# Patient Record
Sex: Male | Born: 1985 | Race: Black or African American | Hispanic: No | Marital: Single
Health system: Midwestern US, Community
[De-identification: ages and names within clinical notes are randomized; demographics above are authoritative.]

## PROBLEM LIST (undated history)

## (undated) ENCOUNTER — Ambulatory Visit (HOSPITAL_COMMUNITY): Payer: Self-pay

## (undated) DIAGNOSIS — K219 Gastro-esophageal reflux disease without esophagitis: Secondary | ICD-10-CM

## (undated) DIAGNOSIS — R1115 Cyclical vomiting syndrome unrelated to migraine: Secondary | ICD-10-CM

## (undated) DIAGNOSIS — F319 Bipolar disorder, unspecified: Secondary | ICD-10-CM

## (undated) DIAGNOSIS — F23 Brief psychotic disorder: Secondary | ICD-10-CM

## (undated) DIAGNOSIS — N200 Calculus of kidney: Secondary | ICD-10-CM

## (undated) HISTORY — DX: Gastro-esophageal reflux disease without esophagitis: K21.9

## (undated) HISTORY — PX: APPENDECTOMY: SHX54

## (undated) HISTORY — PX: OTHER SURGICAL HISTORY: SHX169

---
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
## 2007-09-08 ENCOUNTER — Emergency Department (HOSPITAL_COMMUNITY): Admission: EM | Admit: 2007-09-08 | Discharge: 2007-09-08 | Payer: Self-pay | Admitting: Emergency Medicine

## 2008-01-19 ENCOUNTER — Emergency Department (HOSPITAL_COMMUNITY): Admission: EM | Admit: 2008-01-19 | Discharge: 2008-01-19 | Payer: Self-pay | Admitting: Emergency Medicine

## 2008-01-19 ENCOUNTER — Emergency Department (HOSPITAL_COMMUNITY): Admission: EM | Admit: 2008-01-19 | Discharge: 2008-01-20 | Payer: Self-pay | Admitting: Emergency Medicine

## 2008-08-18 ENCOUNTER — Emergency Department (HOSPITAL_COMMUNITY): Admission: EM | Admit: 2008-08-18 | Discharge: 2008-08-18 | Payer: Self-pay | Admitting: Emergency Medicine

## 2008-08-20 ENCOUNTER — Emergency Department (HOSPITAL_COMMUNITY): Admission: EM | Admit: 2008-08-20 | Discharge: 2008-08-20 | Payer: Self-pay | Admitting: Emergency Medicine

## 2009-01-18 ENCOUNTER — Emergency Department (HOSPITAL_COMMUNITY): Admission: EM | Admit: 2009-01-18 | Discharge: 2009-01-18 | Payer: Self-pay | Admitting: Emergency Medicine

## 2009-03-09 ENCOUNTER — Emergency Department (HOSPITAL_COMMUNITY): Admission: EM | Admit: 2009-03-09 | Discharge: 2009-03-09 | Payer: Self-pay | Admitting: Emergency Medicine

## 2009-09-10 ENCOUNTER — Emergency Department (HOSPITAL_COMMUNITY): Admission: EM | Admit: 2009-09-10 | Discharge: 2009-09-10 | Payer: Self-pay | Admitting: Emergency Medicine

## 2010-04-24 ENCOUNTER — Emergency Department (HOSPITAL_COMMUNITY): Admission: EM | Admit: 2010-04-24 | Discharge: 2010-04-24 | Payer: Self-pay | Admitting: Emergency Medicine

## 2010-04-25 ENCOUNTER — Emergency Department (HOSPITAL_COMMUNITY): Admission: EM | Admit: 2010-04-25 | Discharge: 2010-04-25 | Payer: Self-pay | Admitting: Emergency Medicine

## 2010-04-25 ENCOUNTER — Encounter (INDEPENDENT_AMBULATORY_CARE_PROVIDER_SITE_OTHER): Payer: Self-pay | Admitting: *Deleted

## 2010-04-28 ENCOUNTER — Encounter (INDEPENDENT_AMBULATORY_CARE_PROVIDER_SITE_OTHER): Payer: Self-pay | Admitting: *Deleted

## 2010-06-10 ENCOUNTER — Encounter: Payer: Self-pay | Admitting: Gastroenterology

## 2010-06-10 ENCOUNTER — Ambulatory Visit
Admission: RE | Admit: 2010-06-10 | Discharge: 2010-06-10 | Payer: Self-pay | Source: Home / Self Care | Attending: Gastroenterology | Admitting: Gastroenterology

## 2010-06-10 ENCOUNTER — Encounter (INDEPENDENT_AMBULATORY_CARE_PROVIDER_SITE_OTHER): Payer: Self-pay | Admitting: *Deleted

## 2010-06-10 DIAGNOSIS — R1013 Epigastric pain: Secondary | ICD-10-CM | POA: Insufficient documentation

## 2010-06-18 ENCOUNTER — Ambulatory Visit
Admission: RE | Admit: 2010-06-18 | Discharge: 2010-06-18 | Payer: Self-pay | Source: Home / Self Care | Attending: Gastroenterology | Admitting: Gastroenterology

## 2010-06-18 ENCOUNTER — Encounter: Payer: Self-pay | Admitting: Gastroenterology

## 2010-06-29 ENCOUNTER — Ambulatory Visit (HOSPITAL_COMMUNITY)
Admission: RE | Admit: 2010-06-29 | Discharge: 2010-06-29 | Payer: Self-pay | Source: Home / Self Care | Attending: Gastroenterology | Admitting: Gastroenterology

## 2010-06-30 ENCOUNTER — Encounter: Payer: Self-pay | Admitting: Gastroenterology

## 2010-06-30 NOTE — Letter (Signed)
Summary: New Patient letter  [facility]  [address] [place], [place] [address]   Phone: [phone]  Fax: [phone]       04/28/2010 MRN: [id number]  Erik Cowan [address] [place], [place]  [address]  Dear Mr. Lyn Hollingshead,  Welcome to the Gastroenterology Division at [facility].    You are scheduled to see Dr.  Arlyce Dice on 06/10/2010 at 10:45AM on the 3rd floor at [facility], [address]  We ask that you try to arrive at our office 15 minutes prior to your appointment time to allow for check-in.  We would like you to complete the enclosed self-administered evaluation form prior to your visit and bring it with you on the day of your appointment.  We will review it with you.  Also, please bring a complete list of all your medications or, if you prefer, bring the medication bottles and we will list them.  Please bring your insurance card so that we may make a copy of it.  If your insurance requires a referral to see a specialist, please bring your referral form from your primary care physician.  Co-payments are due at the time of your visit and may be paid by cash, check or credit card.     Your office visit will consist of a consult with your physician (includes a physical exam), any laboratory testing he/she may order, scheduling of any necessary diagnostic testing (e.g. x-ray, ultrasound, CT-scan), and scheduling of a procedure (e.g. Endoscopy, Colonoscopy) if required.  Please allow enough time on your schedule to allow for any/all of these possibilities.    If you cannot keep your appointment, please call [phone] to cancel or reschedule prior to your appointment date.  This allows [place] the opportunity to schedule an appointment for another patient in need of care.  If you do not cancel or reschedule by 5 p.m. the business day prior to your appointment date, you will be charged a $50.00 late cancellation/no-show fee.    Thank you for  choosing [facility] for your medical needs.  We appreciate the opportunity to care for you.  Please visit [place] at our website  to learn more about our practice.                     Sincerely,                                                             The Gastroenterology Division

## 2010-07-02 NOTE — Letter (Signed)
Summary: EGD Instructions  [facility]  [address] [place], [place] [address]   Phone: [phone]  Fax: [phone]       Erik Cowan    01-14-1986    MRN: [id number]       Procedure Day /Date: Thursday 06/18/2010     Arrival Time: 8:30am     Procedure Time: 9:30am     Location of Procedure:                    [facility] (4th Floor)  PREPARATION FOR ENDOSCOPY  On 06/18/2010 THE DAY OF THE PROCEDURE:  1.   No solid foods, milk or milk products are allowed after midnight the night before your procedure.  2.   Do not drink anything colored red or purple.  Avoid juices with pulp.  No orange juice.  3.  You may drink clear liquids until 7:30am, which is 2 hours before your procedure.                                                                                                CLEAR LIQUIDS INCLUDE: Water Jello Ice Popsicles Tea (sugar ok, no milk/cream) Powdered fruit flavored drinks Coffee (sugar ok, no milk/cream) Gatorade Juice: apple, white grape, white cranberry  Lemonade Clear bullion, consomm, broth Carbonated beverages (any kind) Strained chicken noodle soup Hard Candy   MEDICATION INSTRUCTIONS  Unless otherwise instructed, you should take regular prescription medications with a small sip of water as early as possible the morning of your procedure.                   OTHER INSTRUCTIONS  You will need a responsible adult at least 25 years of age to accompany you and drive you home.   This person must remain in the waiting room during your procedure.  Wear loose fitting clothing that is easily removed.  Leave jewelry and other valuables at home.  However, you may wish to bring a book to read or an iPod/MP3 player to listen to music as you wait for your procedure to start.  Remove all body piercing jewelry and leave at home.  Total time from sign-in until discharge is approximately 2-3 hours.  You should go  home directly after your procedure and rest.  You can resume normal activities the day after your procedure.  The day of your procedure you should not:   Drive   Make legal decisions   Operate machinery   Drink alcohol   Return to work  You will receive specific instructions about eating, activities and medications before you leave.    The above instructions have been reviewed and explained to me by   _______________________    I fully understand and can verbalize these instructions _____________________________ Date _________

## 2010-07-02 NOTE — Letter (Signed)
Summary: Results Letter  [facility]  [address] [place], [place] [address]   Phone: [phone]  Fax: [phone]        June 10, 2010 MRN: [id number]    Erik Cowan [address] [place], [place]  [address]    Dear [name],  It is my pleasure to have treated you recently as a new patient in my office. I appreciate your confidence and the opportunity to participate in your care.  Since I do have a busy inpatient endoscopy schedule and office schedule, my office hours vary weekly. I am, however, available for emergency calls everyday through my office. If I am not available for an urgent office appointment, another one of our gastroenterologist will be able to assist you.  My well-trained staff are prepared to help you at all times. For emergencies after office hours, a physician from our Gastroenterology section is always available through my 24 hour answering service  Once again I welcome you as a new patient and I look forward to a happy and healthy relationship             Sincerely,  Louis Meckel MD  This letter has been electronically signed by your physician.

## 2010-07-02 NOTE — Procedures (Signed)
Summary: Upper Endoscopy  Patient: Erik Cowan Note: All result statuses are Final unless otherwise noted.  Tests: (1) Upper Endoscopy (EGD)   EGD Upper Endoscopy       DONE     [facility]     [address]     [place], [place]  [address]           E3NDOSCOPY PROCEDURE REPORT           PATIENT:  Erik Cowan, Erik Cowan  MR#:  [id number]     BIRTHDATE:  [birth year], 24 yrs. old  GENDER:  male           ENDOSCOPIST:  Barbette Hair. Arlyce Dice, MD     Referred by:           PROCEDURE DATE:  06/18/2010     PROCEDURE:  EGD, diagnostic 43235     ASA CLASS:  Class I     INDICATIONS:  nausea and vomiting, dyspepsia           MEDICATIONS:   Fentanyl 75 mcg IV, Versed 10 mg IV, glycopyrrolate     (Robinal) 0.2 mg IV, 0.6cc simethancone 0.6 cc PO     TOPICAL ANESTHETIC:  Exactacain Spray           DESCRIPTION OF PROCEDURE:   After the risks benefits and     alternatives of the procedure were thoroughly explained, informed     consent was obtained.  The LB GIF-H180 [id number] endoscope was     introduced through the mouth and advanced to the third portion of     the duodenum, without limitations.  The instrument was slowly     withdrawn as the mucosa was fully examined.     <<PROCEDUREIMAGES>>           The upper, middle, and distal third of the esophagus were     carefully inspected and no abnormalities were noted. The z-line     was well seen at the GEJ. The endoscope was pushed into the fundus     which was normal including a retroflexed view. The antrum,gastric     body, first and second part of the duodenum were unremarkable (see     image1, image2, image3, image4, image5, image6, and image7).     Retroflexed views revealed no abnormalities.    The scope was then     withdrawn from the patient and the procedure completed.     COMPLICATIONS:  None           ENDOSCOPIC IMPRESSION:     1) Normal EGD     RECOMMENDATIONS:     1) My office will arrange for you to have a Gastric  Emptying     Scan performed. This is a radiology test that gives an idea of how     well your stomach functions.           REPEAT EXAM:  No           ______________________________     Barbette Hair. Arlyce Dice, MD           CC:           n.     eSIGNED:   Barbette Hair. Kaplan at 06/18/2010 09:57 AM           Janan Halter, [id number]  Note: An exclamation mark (!) indicates a result that was not dispersed into the flowsheet. Document Creation Date: 06/18/2010 9:57 AM _______________________________________________________________________  (1) Order result  status: Final Collection or observation date-time: 06/18/2010 09:50 Requested date-time:  Receipt date-time:  Reported date-time:  Referring Physician:   Ordering Physician: Melvia Heaps [phone]) Specimen Source:  Source: Launa Grill Order Number: [id number] Lab site:   Appended Document: GES Patient scheduled for GES at [facility] for 06/29/10@8am . Patient to arrive at 7:45am. Patient instructed to be NPO after midnight. Patient verbalized understanding.   Clinical Lists Changes  Orders: Added new Test order of Gastric Emptying Scan (GES) - Signed

## 2010-07-02 NOTE — Assessment & Plan Note (Signed)
Summary: REFLUX...AS.   History of Present Illness Visit Type: Initial Visit Primary GI MD: Melvia Heaps MD [facility] Primary Provider: n/a Requesting Provider: Nelva Nay, MD Chief Complaint: severe GERD, mostly after meals History of Present Illness:   Erik Cowan is a 25 year old African American male referred by the ER for evaluation of abdominal pain.  For the last 8 years he has been complaining of chronic upper abdominal symptoms including postprandial bloating, discomfort, nausea and vomiting.  He may have episodes of protracted vomiting soon after eating and may last for up to a day. Episodes  occur at least a week out of every month.  He has been taking Prilosec twice a day without relief.  With nausea vomiting and pain he often develops postprandial diarrhea. There is no history of melena or hematochezia.  He is in no gastric irritants including nonsteroidals.  At an ER evaluation in November, 2011 white count was 9.3 and hemoglobin 13.5.  LFTs were normal.  CT Scan of the abdomen and pelvis was unremarkable.  Weight has been stable.   GI Review of Systems    Reports abdominal pain, acid reflux, belching, bloating, loss of appetite, nausea, vomiting, and  vomiting blood.     Location of  Abdominal pain: epigastric area.    Denies chest pain, dysphagia with liquids, dysphagia with solids, heartburn, weight loss, and  weight gain.      Reports diarrhea.     Denies anal fissure, black tarry stools, change in bowel habit, constipation, diverticulosis, fecal incontinence, heme positive stool, hemorrhoids, irritable bowel syndrome, jaundice, light color stool, liver problems, rectal bleeding, and  rectal pain. Preventive Screening-Counseling & Management  Alcohol-Tobacco     Smoking Status: current      Drug Use:  yes.      Current Medications (verified): 1)  Prilosec Otc 20 Mg Tbec (Omeprazole Magnesium) .... Take 1 Capsule By Mouth Two Times A Day  Allergies (verified): No Known  Drug Allergies  Past History:  Past Medical History: GERD  Family History: Family History of Colon Cancer:Uncle Family History of Liver Cancer:Mother Family History of Diabetes:  Family History of Heart Disease: Paternal  Social History: Occupation: [employer] Patient currently smokes.  Alcohol Use - no Daily Caffeine Use Illicit Drug Use - yes Marijuana Smoking Status:  current Drug Use:  yes  Review of Systems       The patient complains of back pain, fatigue, headaches-new, muscle pains/cramps, and urination changes/pain.  The patient denies allergy/sinus, anemia, anxiety-new, arthritis/joint pain, blood in urine, breast changes/lumps, change in vision, confusion, cough, coughing up blood, depression-new, fainting, fever, hearing problems, heart murmur, heart rhythm changes, itching, menstrual pain, night sweats, nosebleeds, pregnancy symptoms, shortness of breath, skin rash, sleeping problems, sore throat, swelling of feet/legs, swollen lymph glands, thirst - excessive , urination - excessive , urine leakage, vision changes, and voice change.         All other systems were reviewed and were negative   Vital Signs:  Patient profile:   25 year old male Height:      74 inches Weight:      155.38 pounds BMI:     20.02 Pulse rate:   60 / minute Pulse rhythm:   regular BP sitting:   120 / 90  (left arm) Cuff size:   regular  Vitals Entered By: June McMurray CMA Duncan Dull) (June 10, 2010 11:07 AM)  Physical Exam  Additional Exam:  On physical exam he is a thin male  skin: anicteric HEENT: normocephalic; PEERLA; no nasal or pharyngeal abnormalities neck: supple nodes: no cervical lymphadenopathy chest: clear to ausculatation and percussion heart: no murmurs, gallops, or rubs abd: soft, nontender; BS normoactive; no abdominal masses,  organomegaly; there is a mild tenderness to palpation in the epigastrium rectal: deferred ext: no cynanosis, clubbing,  edema skeletal: no deformities neuro: oriented x 3; no focal abnormalities    Impression & Recommendations:  Problem # 1:  ABDOMINAL PAIN, EPIGASTRIC (ICD-789.06)  Patient's symptoms are more dyspeptic than they are suggestive of GERD.  Ulcer or nonulcer dyspepsia are possibilities.  Omeprazole may be contributing to his symptoms.  Gastroparesis and irritable bowel are other concerns.  Recommendations #1 discontinue omeprazole #2 upper endoscopy #32 consider gastric emptying scan pending results of endoscopy #4 hyomax p.r.n.  Orders: EGD (EGD)  Patient Instructions: 1)  Copy sent to : Nelva Nay, MD 2)  Your procedure has been scheduled for 06/12/2010, please follow the seperate instructions.  3)  [facility] Patient Information Guide given to patient.  4)  Upper Endoscopy brochure given.  5)  The medication list was reviewed and reconciled.  All changed / newly prescribed medications were explained.  A complete medication list was provided to the patient / caregiver. Prescriptions: HYOMAX-SL 0.125 MG SUBL (HYOSCYAMINE SULFATE) take 2 tablets sublingual q.4 h. p.r.n. abdominal pain  #25 x 2   Entered and Authorized by:   Louis Meckel MD   Signed by:   Louis Meckel MD on 06/10/2010   Method used:   Electronically to        [facility]* (retail)       [address] [place], [place]  [address]       Ph: [phone]       Fax: [phone]   RxID:   [id number]

## 2010-07-08 NOTE — Letter (Signed)
Summary: Results Letter  [facility]  [address] [place], [place] [address]   Phone: [phone]  Fax: [phone]        June 30, 2010 MRN: [id number]    Erik Cowan [address] [place], [place]  [address]    Dear Mr. Arizpe,  Your gastric emptying scan did not show any remarkable findings.  Please continue with the recommendations previously discussed.  Please make a followup office appointment. Should you have any further questions or immediate concers, feel free to contact me.  Sincerely,  Barbette Hair. Arlyce Dice, M.D., [facility]          Sincerely,  Louis Meckel MD  This letter has been electronically signed by your physician.  Appended Document: Results Letter Letter is mailed to the patient's home address

## 2010-07-10 ENCOUNTER — Telehealth: Payer: Self-pay | Admitting: Gastroenterology

## 2010-07-15 ENCOUNTER — Emergency Department (HOSPITAL_COMMUNITY)
Admission: EM | Admit: 2010-07-15 | Discharge: 2010-07-16 | Disposition: A | Discharge status: Home / Self Care | Payer: Self-pay | Attending: Emergency Medicine | Admitting: Emergency Medicine

## 2010-07-15 ENCOUNTER — Telehealth: Payer: Self-pay | Admitting: Gastroenterology

## 2010-07-15 DIAGNOSIS — R112 Nausea with vomiting, unspecified: Secondary | ICD-10-CM | POA: Insufficient documentation

## 2010-07-15 DIAGNOSIS — K219 Gastro-esophageal reflux disease without esophagitis: Secondary | ICD-10-CM | POA: Insufficient documentation

## 2010-07-15 DIAGNOSIS — R1013 Epigastric pain: Secondary | ICD-10-CM | POA: Insufficient documentation

## 2010-07-15 LAB — CBC
HCT: 35.5 % — ABNORMAL LOW (ref 39.0–52.0)
MCH: 33.7 pg (ref 26.0–34.0)
MCHC: 35.2 g/dL (ref 30.0–36.0)
MCV: 95.7 fL (ref 78.0–100.0)
Platelets: 147 10*3/uL — ABNORMAL LOW (ref 150–400)
RDW: 12 % (ref 11.5–15.5)
WBC: 10.9 10*3/uL — ABNORMAL HIGH (ref 4.0–10.5)

## 2010-07-15 LAB — DIFFERENTIAL
Eosinophils Absolute: 0 10*3/uL (ref 0.0–0.7)
Eosinophils Relative: 0 % (ref 0–5)
Lymphs Abs: 0.8 10*3/uL (ref 0.7–4.0)
Monocytes Absolute: 0.5 10*3/uL (ref 0.1–1.0)
Monocytes Relative: 5 % (ref 3–12)

## 2010-07-16 LAB — LIPASE, BLOOD: Lipase: 33 U/L (ref 11–59)

## 2010-07-16 LAB — HEPATIC FUNCTION PANEL
Bilirubin, Direct: 0.2 mg/dL (ref 0.0–0.3)
Indirect Bilirubin: 0.6 mg/dL (ref 0.3–0.9)
Total Protein: 7.8 g/dL (ref 6.0–8.3)

## 2010-07-16 LAB — BASIC METABOLIC PANEL
BUN: 15 mg/dL (ref 6–23)
Creatinine, Ser: 1.05 mg/dL (ref 0.4–1.5)
GFR calc non Af Amer: >60 mL/min (ref >60)
Glucose, Bld: 148 mg/dL — ABNORMAL HIGH (ref 70–99)

## 2010-07-16 NOTE — Progress Notes (Signed)
Summary: results  Phone Note Call from Patient Call back at Home Phone [phone]   Caller: Patient Call For: Dr Arlyce Dice Reason for Call: Talk to Nurse Summary of Call: Patient wants Emptying Scan results Initial call taken by: Tawni Levy,  July 10, 2010 2:13 PM  Follow-up for Phone Call        Informed patient letter was mailed stating that the GES was normal. Patient scheduled for a f/u appointment with Dr. Arlyce Dice for 08/20/10@10am . Patient verbalized understanding. Follow-up by: Selinda Michaels RN,  July 10, 2010 3:05 PM

## 2010-07-17 ENCOUNTER — Emergency Department (HOSPITAL_COMMUNITY)
Admission: EM | Admit: 2010-07-17 | Discharge: 2010-07-17 | Disposition: A | Discharge status: Home / Self Care | Payer: Self-pay | Attending: Emergency Medicine | Admitting: Emergency Medicine

## 2010-07-17 ENCOUNTER — Emergency Department (HOSPITAL_COMMUNITY): Payer: Self-pay

## 2010-07-17 DIAGNOSIS — R1013 Epigastric pain: Secondary | ICD-10-CM | POA: Insufficient documentation

## 2010-07-17 DIAGNOSIS — R112 Nausea with vomiting, unspecified: Secondary | ICD-10-CM | POA: Insufficient documentation

## 2010-07-17 DIAGNOSIS — K219 Gastro-esophageal reflux disease without esophagitis: Secondary | ICD-10-CM | POA: Insufficient documentation

## 2010-07-17 DIAGNOSIS — R10816 Epigastric abdominal tenderness: Secondary | ICD-10-CM | POA: Insufficient documentation

## 2010-07-17 LAB — POCT I-STAT, CHEM 8
Chloride: 103 mEq/L (ref 96–112)
Glucose, Bld: 110 mg/dL — ABNORMAL HIGH (ref 70–99)
HCT: 41 % (ref 39.0–52.0)
Hemoglobin: 13.9 g/dL (ref 13.0–17.0)
Potassium: 3.8 mEq/L (ref 3.5–5.1)
Sodium: 135 mEq/L (ref 135–145)

## 2010-07-17 LAB — DIFFERENTIAL
Basophils Absolute: 0 10*3/uL (ref 0.0–0.1)
Basophils Relative: 0 % (ref 0–1)
Eosinophils Relative: 0 % (ref 0–5)
Lymphocytes Relative: 7 % — ABNORMAL LOW (ref 12–46)
Monocytes Absolute: 0.8 10*3/uL (ref 0.1–1.0)
Neutro Abs: 8.4 10*3/uL — ABNORMAL HIGH (ref 1.7–7.7)

## 2010-07-17 LAB — CBC
HCT: 38.6 % — ABNORMAL LOW (ref 39.0–52.0)
Hemoglobin: 13.9 g/dL (ref 13.0–17.0)
MCHC: 36 g/dL (ref 30.0–36.0)
RDW: 12 % (ref 11.5–15.5)
WBC: 9.9 10*3/uL (ref 4.0–10.5)

## 2010-07-22 NOTE — Progress Notes (Signed)
  Phone Note Call from Patient   Caller: Brother Summary of Call: On call note @2015 . Pt has recurrent N/V/abd pain without a clear etiology. Hyomax was sent to pharmacy in [place] and pt never received it. Pt requests [facility] in [place] @ [phone]. Hyomax 0.125mg , 1-2 SL q4h as needed for abd pain, #30 and promethazine 25 mg supp, 1 PR q8h as needed for N/V, #6. If his symptoms do not improve should go to ED. Call Dr. Marzetta Board office tomorrow for further mgmt. Initial call taken by: Meryl Dare MD Clementeen Graham,  July 15, 2010 9:20 PM

## 2010-08-11 LAB — DIFFERENTIAL
Basophils Absolute: 0 10*3/uL (ref 0.0–0.1)
Basophils Absolute: 0 10*3/uL (ref 0.0–0.1)
Basophils Absolute: 0 K/uL (ref 0.0–0.1)
Basophils Relative: 0 % (ref 0–1)
Basophils Relative: 0 % (ref 0–1)
Eosinophils Absolute: 0 10*3/uL (ref 0.0–0.7)
Eosinophils Absolute: 0 10*3/uL (ref 0.0–0.7)
Eosinophils Relative: 0 % (ref 0–5)
Lymphocytes Relative: 4 % — ABNORMAL LOW (ref 12–46)
Lymphocytes Relative: 7 % — ABNORMAL LOW (ref 12–46)
Lymphs Abs: 0.6 10*3/uL — ABNORMAL LOW (ref 0.7–4.0)
Lymphs Abs: 0.6 10*3/uL — ABNORMAL LOW (ref 0.7–4.0)
Lymphs Abs: 0.7 10*3/uL (ref 0.7–4.0)
Monocytes Absolute: 0.3 10*3/uL (ref 0.1–1.0)
Monocytes Absolute: 0.6 K/uL (ref 0.1–1.0)
Monocytes Relative: 4 % (ref 3–12)
Monocytes Relative: 4 % (ref 3–12)
Neutro Abs: 14 10*3/uL — ABNORMAL HIGH (ref 1.7–7.7)
Neutro Abs: 8.6 10*3/uL — ABNORMAL HIGH (ref 1.7–7.7)
Neutrophils Relative %: 87 % — ABNORMAL HIGH (ref 43–77)
Neutrophils Relative %: 92 % — ABNORMAL HIGH (ref 43–77)

## 2010-08-11 LAB — COMPREHENSIVE METABOLIC PANEL
ALT: 24 U/L (ref 0–53)
ALT: 27 U/L (ref 0–53)
AST: 32 U/L (ref 0–37)
Alkaline Phosphatase: 62 U/L (ref 39–117)
Alkaline Phosphatase: 71 U/L (ref 39–117)
CO2: 23 mEq/L (ref 19–32)
CO2: 25 mEq/L (ref 19–32)
Calcium: 10.1 mg/dL (ref 8.4–10.5)
Chloride: 102 mEq/L (ref 96–112)
GFR calc Af Amer: >60 mL/min (ref >60)
GFR calc non Af Amer: >60 mL/min (ref >60)
GFR calc non Af Amer: >60 mL/min (ref >60)
Glucose, Bld: 115 mg/dL — ABNORMAL HIGH (ref 70–99)
Glucose, Bld: 153 mg/dL — ABNORMAL HIGH (ref 70–99)
Potassium: 3.6 mEq/L (ref 3.5–5.1)
Sodium: 134 mEq/L — ABNORMAL LOW (ref 135–145)
Sodium: 137 mEq/L (ref 135–145)
Total Bilirubin: 0.9 mg/dL (ref 0.3–1.2)

## 2010-08-11 LAB — CBC
HCT: 34.2 % — ABNORMAL LOW (ref 39.0–52.0)
HCT: 36.9 % — ABNORMAL LOW (ref 39.0–52.0)
HCT: 37.4 % — ABNORMAL LOW (ref 39.0–52.0)
Hemoglobin: 12.2 g/dL — ABNORMAL LOW (ref 13.0–17.0)
Hemoglobin: 13.4 g/dL (ref 13.0–17.0)
Hemoglobin: 13.5 g/dL (ref 13.0–17.0)
MCH: 33.3 pg (ref 26.0–34.0)
MCHC: 36.1 g/dL — ABNORMAL HIGH (ref 30.0–36.0)
MCHC: 36.3 g/dL — ABNORMAL HIGH (ref 30.0–36.0)
MCV: 91.6 fL (ref 78.0–100.0)
Platelets: 130 K/uL — ABNORMAL LOW (ref 150–400)
RBC: 3.73 MIL/uL — ABNORMAL LOW (ref 4.22–5.81)
RBC: 4.03 MIL/uL — ABNORMAL LOW (ref 4.22–5.81)
RDW: 11.3 % — ABNORMAL LOW (ref 11.5–15.5)
RDW: 11.5 % (ref 11.5–15.5)
WBC: 15.2 K/uL — ABNORMAL HIGH (ref 4.0–10.5)
WBC: 9.5 10*3/uL (ref 4.0–10.5)

## 2010-08-11 LAB — URINALYSIS, ROUTINE W REFLEX MICROSCOPIC
Bilirubin Urine: NEGATIVE
Glucose, UA: 100 mg/dL — AB
Ketones, ur: 40 mg/dL — AB
Ketones, ur: 40 mg/dL — AB
Leukocytes, UA: NEGATIVE
Nitrite: NEGATIVE
Nitrite: NEGATIVE
Protein, ur: 30 mg/dL — AB
Protein, ur: NEGATIVE mg/dL
Specific Gravity, Urine: 1.021 (ref 1.005–1.030)
Urobilinogen, UA: 0.2 mg/dL (ref 0.0–1.0)
Urobilinogen, UA: 1 mg/dL (ref 0.0–1.0)
pH: 7.5 (ref 5.0–8.0)

## 2010-08-11 LAB — LIPASE, BLOOD
Lipase: 22 U/L (ref 11–59)
Lipase: 23 U/L (ref 11–59)

## 2010-08-11 LAB — RAPID URINE DRUG SCREEN, HOSP PERFORMED
Amphetamines: NOT DETECTED
Amphetamines: NOT DETECTED
Cocaine: NOT DETECTED
Cocaine: NOT DETECTED
Opiates: NOT DETECTED
Opiates: POSITIVE — AB
Tetrahydrocannabinol: POSITIVE — AB
Tetrahydrocannabinol: POSITIVE — AB

## 2010-08-11 LAB — COMPREHENSIVE METABOLIC PANEL WITH GFR
Albumin: 5 g/dL (ref 3.5–5.2)
BUN: 15 mg/dL (ref 6–23)
Calcium: 10.2 mg/dL (ref 8.4–10.5)
Creatinine, Ser: 1.05 mg/dL (ref 0.4–1.5)
Total Protein: 8 g/dL (ref 6.0–8.3)

## 2010-08-11 LAB — URINE MICROSCOPIC-ADD ON

## 2010-08-11 LAB — POCT I-STAT, CHEM 8
Chloride: 100 mEq/L (ref 96–112)
HCT: 40 % (ref 39.0–52.0)
Potassium: 3.6 mEq/L (ref 3.5–5.1)
Sodium: 136 mEq/L (ref 135–145)

## 2010-08-11 LAB — GC/CHLAMYDIA PROBE AMP, GENITAL
Chlamydia, DNA Probe: NEGATIVE
GC Probe Amp, Genital: NEGATIVE

## 2010-08-11 LAB — ETHANOL: Alcohol, Ethyl (B): <5 mg/dL (ref 0–10)

## 2010-08-19 LAB — URINALYSIS, ROUTINE W REFLEX MICROSCOPIC
Glucose, UA: NEGATIVE mg/dL
Leukocytes, UA: NEGATIVE
Protein, ur: NEGATIVE mg/dL
Specific Gravity, Urine: 1.021 (ref 1.005–1.030)
pH: 8.5 — ABNORMAL HIGH (ref 5.0–8.0)

## 2010-08-19 LAB — COMPREHENSIVE METABOLIC PANEL
Albumin: 4.9 g/dL (ref 3.5–5.2)
Alkaline Phosphatase: 81 U/L (ref 39–117)
BUN: 14 mg/dL (ref 6–23)
Calcium: 9.6 mg/dL (ref 8.4–10.5)
Potassium: 4.1 mEq/L (ref 3.5–5.1)
Sodium: 139 mEq/L (ref 135–145)
Total Protein: 8.5 g/dL — ABNORMAL HIGH (ref 6.0–8.3)

## 2010-08-19 LAB — URINE MICROSCOPIC-ADD ON

## 2010-08-19 LAB — DIFFERENTIAL
Basophils Relative: 0 % (ref 0–1)
Lymphs Abs: 0.9 10*3/uL (ref 0.7–4.0)
Monocytes Absolute: 0.6 10*3/uL (ref 0.1–1.0)
Monocytes Relative: 4 % (ref 3–12)
Neutro Abs: 15.7 10*3/uL — ABNORMAL HIGH (ref 1.7–7.7)

## 2010-08-19 LAB — CBC
HCT: 40 % (ref 39.0–52.0)
MCHC: 34.4 g/dL (ref 30.0–36.0)
Platelets: 169 10*3/uL (ref 150–400)
RDW: 13.1 % (ref 11.5–15.5)

## 2010-08-20 ENCOUNTER — Ambulatory Visit (INDEPENDENT_AMBULATORY_CARE_PROVIDER_SITE_OTHER): Payer: Self-pay | Admitting: Gastroenterology

## 2010-08-20 ENCOUNTER — Other Ambulatory Visit (INDEPENDENT_AMBULATORY_CARE_PROVIDER_SITE_OTHER): Payer: Self-pay

## 2010-08-20 ENCOUNTER — Encounter: Payer: Self-pay | Admitting: Gastroenterology

## 2010-08-20 ENCOUNTER — Emergency Department (HOSPITAL_COMMUNITY)
Admission: EM | Admit: 2010-08-20 | Discharge: 2010-08-20 | Disposition: A | Discharge status: Home / Self Care | Payer: Self-pay | Attending: Emergency Medicine | Admitting: Emergency Medicine

## 2010-08-20 ENCOUNTER — Emergency Department (HOSPITAL_COMMUNITY): Payer: Self-pay

## 2010-08-20 VITALS — BP 146/90 | HR 48 | Ht 75.0 in | Wt 154.4 lb

## 2010-08-20 DIAGNOSIS — R1013 Epigastric pain: Secondary | ICD-10-CM

## 2010-08-20 DIAGNOSIS — R109 Unspecified abdominal pain: Secondary | ICD-10-CM | POA: Insufficient documentation

## 2010-08-20 DIAGNOSIS — R112 Nausea with vomiting, unspecified: Secondary | ICD-10-CM | POA: Insufficient documentation

## 2010-08-20 DIAGNOSIS — R630 Anorexia: Secondary | ICD-10-CM | POA: Insufficient documentation

## 2010-08-20 DIAGNOSIS — R197 Diarrhea, unspecified: Secondary | ICD-10-CM | POA: Insufficient documentation

## 2010-08-20 DIAGNOSIS — K219 Gastro-esophageal reflux disease without esophagitis: Secondary | ICD-10-CM | POA: Insufficient documentation

## 2010-08-20 LAB — URINE MICROSCOPIC-ADD ON

## 2010-08-20 LAB — COMPREHENSIVE METABOLIC PANEL
AST: 33 U/L (ref 0–37)
Albumin: 4.7 g/dL (ref 3.5–5.2)
BUN: 15 mg/dL (ref 6–23)
Calcium: 9.7 mg/dL (ref 8.4–10.5)
Creatinine, Ser: 0.92 mg/dL (ref 0.4–1.5)
GFR calc Af Amer: >60 mL/min (ref >60)
Total Bilirubin: 0.8 mg/dL (ref 0.3–1.2)
Total Protein: 8 g/dL (ref 6.0–8.3)

## 2010-08-20 LAB — URINALYSIS, ROUTINE W REFLEX MICROSCOPIC
Specific Gravity, Urine: 1.033 — ABNORMAL HIGH (ref 1.005–1.030)
Urobilinogen, UA: 0.2 mg/dL (ref 0.0–1.0)
pH: 6 (ref 5.0–8.0)

## 2010-08-20 LAB — CBC
HCT: 36.8 % — ABNORMAL LOW (ref 39.0–52.0)
Hemoglobin: 13.1 g/dL (ref 13.0–17.0)
RDW: 12.2 % (ref 11.5–15.5)
WBC: 12.3 10*3/uL — ABNORMAL HIGH (ref 4.0–10.5)

## 2010-08-20 LAB — DIFFERENTIAL
Basophils Absolute: 0 10*3/uL (ref 0.0–0.1)
Lymphocytes Relative: 6 % — ABNORMAL LOW (ref 12–46)
Neutro Abs: 11 10*3/uL — ABNORMAL HIGH (ref 1.7–7.7)

## 2010-08-20 LAB — HEMOGLOBIN A1C: Hgb A1c MFr Bld: 5 % (ref 4.6–6.5)

## 2010-08-20 MED ORDER — METOCLOPRAMIDE HCL 10 MG PO TABS: ORAL_TABLET | ORAL | Status: DC

## 2010-08-20 NOTE — Progress Notes (Signed)
History of Present Illness: [name]  has returned for followup of his abdominal pain, nausea, and vomiting. Endoscopy was unrevealing. He has been to the ER twice for persistent symptoms. He has abdominal pain, fairly constantly and vomits small amounts throughout the day.   Abdominal x-ray was unremarkable. Prior CT from November, 2011 demonstrated  a right kidney stone.   Laboratory is pertinent for  persistent hyperglycemia, ranging from 110-148.    Review of Systems: Pertinent positive and negative review of systems were noted in the above HPI section. All other review of systems were otherwise negative.    Current Medications, Allergies, Past Medical History, Past Surgical History, Family History and Social History were reviewed in [facility] electronic medical record  Vital signs were reviewed in today's medical record. Physical Exam: General: Well developed , well nourished, no acute distress Head: Normocephalic and atraumatic Eyes:  sclerae anicteric, EOMI Ears: Normal auditory acuity Mouth: No deformity or lesions Lungs: Clear throughout to auscultation Heart: Regular rate and rhythm; no murmurs, rubs or bruits; there is no succession splash Abdomen: Soft,non distended. No masses, hepatosplenomegaly or hernias noted. Normal Bowel sounds;  There is minimal tenderness in the midepigastrium. Rectal:deferred Musculoskeletal: Symmetrical with no gross deformities  Pulses:  Normal pulses noted Extremities: No clubbing, cyanosis, edema or deformities noted Neurological: Alert oriented x 4, grossly nonfocal Psychological:  Alert and cooperative. Normal mood and affect

## 2010-08-20 NOTE — Assessment & Plan Note (Addendum)
Patient remains symptomatic characterized by persistent pain, nausea and vomiting. It is noteworthy that he has mild hyperglycemia, raising the question of diabetes. Prior gastric emptying scan was negative. Endoscopy ruled out active peptic disease. I think it is unlikely that he symptomatic from his kidney stone.  Recommendations #1 empiric trial of Reglan. #2 check hemoglobin A1c. #3. Urinalysis. #4 hepatobiliary scan

## 2010-08-20 NOTE — Patient Instructions (Addendum)
Your HIDA scan has been scheduled  08/26/2010 at 1pm at [facility] [place]  Radiology Nothing to eat or drink 6 hours prior to HIDA scan You will go to the basement today for labs

## 2010-08-21 ENCOUNTER — Observation Stay (HOSPITAL_COMMUNITY)
Admission: EM | Admit: 2010-08-21 | Discharge: 2010-08-22 | Disposition: A | Discharge status: Home / Self Care | Payer: Self-pay | Attending: Emergency Medicine | Admitting: Emergency Medicine

## 2010-08-21 DIAGNOSIS — N200 Calculus of kidney: Secondary | ICD-10-CM | POA: Insufficient documentation

## 2010-08-21 DIAGNOSIS — R112 Nausea with vomiting, unspecified: Principal | ICD-10-CM | POA: Insufficient documentation

## 2010-08-21 LAB — URINALYSIS, ROUTINE W REFLEX MICROSCOPIC
Leukocytes, UA: NEGATIVE
Nitrite: NEGATIVE
Specific Gravity, Urine: 1.02 (ref 1.005–1.030)
pH: 7.5 (ref 5.0–8.0)

## 2010-08-21 LAB — URINE MICROSCOPIC-ADD ON

## 2010-08-21 LAB — DIFFERENTIAL
Basophils Absolute: 0 10*3/uL (ref 0.0–0.1)
Basophils Relative: 0 % (ref 0–1)
Eosinophils Absolute: 0 10*3/uL (ref 0.0–0.7)
Monocytes Absolute: 1 10*3/uL (ref 0.1–1.0)
Monocytes Relative: 9 % (ref 3–12)
Neutrophils Relative %: 82 % — ABNORMAL HIGH (ref 43–77)

## 2010-08-21 LAB — BASIC METABOLIC PANEL
GFR calc non Af Amer: >60 mL/min (ref >60)
Potassium: 3.3 mEq/L — ABNORMAL LOW (ref 3.5–5.1)
Sodium: 134 mEq/L — ABNORMAL LOW (ref 135–145)

## 2010-08-21 LAB — LIPASE, BLOOD: Lipase: 28 U/L (ref 11–59)

## 2010-08-21 LAB — CBC
MCH: 33.8 pg (ref 26.0–34.0)
MCHC: 36.1 g/dL — ABNORMAL HIGH (ref 30.0–36.0)
Platelets: 152 10*3/uL (ref 150–400)
RBC: 3.9 MIL/uL — ABNORMAL LOW (ref 4.22–5.81)

## 2010-08-21 LAB — HEPATIC FUNCTION PANEL
Indirect Bilirubin: 0.9 mg/dL (ref 0.3–0.9)
Total Protein: 7.5 g/dL (ref 6.0–8.3)

## 2010-08-22 ENCOUNTER — Observation Stay (HOSPITAL_COMMUNITY): Payer: Self-pay

## 2010-08-25 NOTE — Progress Notes (Signed)
Robin, I do not see a result for his urinalysis

## 2010-08-26 ENCOUNTER — Inpatient Hospital Stay (HOSPITAL_COMMUNITY): Admission: RE | Admit: 2010-08-26 | Payer: Self-pay | Source: Ambulatory Visit

## 2010-08-28 ENCOUNTER — Telehealth: Payer: Self-pay

## 2010-08-28 NOTE — Telephone Encounter (Signed)
Referred pt to [facility]. Pt was instructed to call them at [phone] and speak with the financial counselor [facility]. Once that process is started if he has problems between now and his appt time at least he will have that part of information in the system and he could call them with problems. Spoke with the triage nurse and she is to talk to the attending regarding pt. Pt aware.

## 2010-09-03 LAB — COMPREHENSIVE METABOLIC PANEL
ALT: 44 U/L (ref 0–53)
Alkaline Phosphatase: 82 U/L (ref 39–117)
CO2: 23 mEq/L (ref 19–32)
Calcium: 9.8 mg/dL (ref 8.4–10.5)
GFR calc non Af Amer: >60 mL/min (ref >60)
Glucose, Bld: 140 mg/dL — ABNORMAL HIGH (ref 70–99)
Potassium: 3.7 mEq/L (ref 3.5–5.1)
Sodium: 136 mEq/L (ref 135–145)
Total Bilirubin: 1.3 mg/dL — ABNORMAL HIGH (ref 0.3–1.2)

## 2010-09-03 LAB — CBC
Hemoglobin: 13 g/dL (ref 13.0–17.0)
MCHC: 35.3 g/dL (ref 30.0–36.0)
RBC: 3.74 MIL/uL — ABNORMAL LOW (ref 4.22–5.81)

## 2010-09-03 LAB — URINALYSIS, ROUTINE W REFLEX MICROSCOPIC
Bilirubin Urine: NEGATIVE
Protein, ur: 100 mg/dL — AB
Specific Gravity, Urine: 1.021 (ref 1.005–1.030)
Urobilinogen, UA: 0.2 mg/dL (ref 0.0–1.0)

## 2010-09-03 LAB — DIFFERENTIAL
Basophils Absolute: 0.2 10*3/uL — ABNORMAL HIGH (ref 0.0–0.1)
Basophils Relative: 2 % — ABNORMAL HIGH (ref 0–1)
Eosinophils Absolute: 0 10*3/uL (ref 0.0–0.7)
Neutrophils Relative %: 91 % — ABNORMAL HIGH (ref 43–77)

## 2010-09-03 LAB — LIPASE, BLOOD: Lipase: 16 U/L (ref 11–59)

## 2010-09-03 LAB — URINE MICROSCOPIC-ADD ON

## 2010-09-05 LAB — URINE CULTURE: Colony Count: NO GROWTH

## 2010-09-05 LAB — COMPREHENSIVE METABOLIC PANEL
AST: 34 U/L (ref 0–37)
Albumin: 4.4 g/dL (ref 3.5–5.2)
Alkaline Phosphatase: 84 U/L (ref 39–117)
Chloride: 94 mEq/L — ABNORMAL LOW (ref 96–112)
GFR calc Af Amer: >60 mL/min (ref >60)
Potassium: 3.4 mEq/L — ABNORMAL LOW (ref 3.5–5.1)
Total Bilirubin: 1.4 mg/dL — ABNORMAL HIGH (ref 0.3–1.2)
Total Protein: 7.5 g/dL (ref 6.0–8.3)

## 2010-09-05 LAB — URINALYSIS, ROUTINE W REFLEX MICROSCOPIC
Glucose, UA: NEGATIVE mg/dL
Ketones, ur: 15 mg/dL — AB
Leukocytes, UA: NEGATIVE
Nitrite: NEGATIVE
Protein, ur: 30 mg/dL — AB
Urobilinogen, UA: 1 mg/dL (ref 0.0–1.0)

## 2010-09-05 LAB — DIFFERENTIAL
Basophils Absolute: 0.1 10*3/uL (ref 0.0–0.1)
Basophils Relative: 0 % (ref 0–1)
Eosinophils Relative: 0 % (ref 0–5)
Lymphocytes Relative: 7 % — ABNORMAL LOW (ref 12–46)
Monocytes Absolute: 1.2 10*3/uL — ABNORMAL HIGH (ref 0.1–1.0)
Monocytes Relative: 9 % (ref 3–12)

## 2010-09-05 LAB — URINE MICROSCOPIC-ADD ON

## 2010-09-05 LAB — CBC
Platelets: 178 10*3/uL (ref 150–400)
WBC: 13.6 10*3/uL — ABNORMAL HIGH (ref 4.0–10.5)

## 2010-09-10 LAB — COMPREHENSIVE METABOLIC PANEL
AST: 33 U/L (ref 0–37)
Albumin: 5 g/dL (ref 3.5–5.2)
BUN: 17 mg/dL (ref 6–23)
CO2: 23 mEq/L (ref 19–32)
Calcium: 9.7 mg/dL (ref 8.4–10.5)
Calcium: 10.2 mg/dL (ref 8.4–10.5)
Chloride: 103 mEq/L (ref 96–112)
Chloride: 104 mEq/L (ref 96–112)
Creatinine, Ser: 1.25 mg/dL (ref 0.4–1.5)
Creatinine, Ser: 1.12 mg/dL (ref 0.4–1.5)
GFR calc Af Amer: >60 mL/min (ref >60)
GFR calc non Af Amer: >60 mL/min (ref >60)
Glucose, Bld: 121 mg/dL — ABNORMAL HIGH (ref 70–99)
Sodium: 138 mEq/L (ref 135–145)
Total Bilirubin: 1.3 mg/dL — ABNORMAL HIGH (ref 0.3–1.2)
Total Bilirubin: 1.1 mg/dL (ref 0.3–1.2)

## 2010-09-10 LAB — DIFFERENTIAL
Basophils Absolute: 0 10*3/uL (ref 0.0–0.1)
Eosinophils Relative: 1 % (ref 0–5)
Eosinophils Relative: 0 % (ref 0–5)
Lymphocytes Relative: 13 % (ref 12–46)
Lymphocytes Relative: 4 % — ABNORMAL LOW (ref 12–46)
Lymphs Abs: 1.1 10*3/uL (ref 0.7–4.0)
Lymphs Abs: 0.7 10*3/uL (ref 0.7–4.0)
Monocytes Absolute: 0.8 10*3/uL (ref 0.1–1.0)
Neutro Abs: 7 10*3/uL (ref 1.7–7.7)
Neutrophils Relative %: 81 % — ABNORMAL HIGH (ref 43–77)

## 2010-09-10 LAB — CBC
HCT: 38.4 % — ABNORMAL LOW (ref 39.0–52.0)
MCHC: 33.9 g/dL (ref 30.0–36.0)
MCV: 99.3 fL (ref 78.0–100.0)
MCV: 98.9 fL (ref 78.0–100.0)
Platelets: 164 10*3/uL (ref 150–400)
RBC: 3.86 MIL/uL — ABNORMAL LOW (ref 4.22–5.81)
WBC: 8.7 10*3/uL (ref 4.0–10.5)
WBC: 19.2 10*3/uL — ABNORMAL HIGH (ref 4.0–10.5)

## 2010-09-10 LAB — POCT I-STAT, CHEM 8
Calcium, Ion: 1.14 mmol/L (ref 1.12–1.32)
Creatinine, Ser: 1.1 mg/dL (ref 0.4–1.5)
Glucose, Bld: 121 mg/dL — ABNORMAL HIGH (ref 70–99)
HCT: 41 % (ref 39.0–52.0)
Hemoglobin: 13.9 g/dL (ref 13.0–17.0)
Potassium: 3.3 mEq/L — ABNORMAL LOW (ref 3.5–5.1)
TCO2: 22 mmol/L (ref 0–100)

## 2010-09-10 LAB — URINALYSIS, ROUTINE W REFLEX MICROSCOPIC
Bilirubin Urine: NEGATIVE
Leukocytes, UA: NEGATIVE
Nitrite: NEGATIVE
Specific Gravity, Urine: 1.021 (ref 1.005–1.030)
Urobilinogen, UA: 1 mg/dL (ref 0.0–1.0)

## 2010-09-10 LAB — LIPASE, BLOOD: Lipase: 16 U/L (ref 11–59)

## 2010-10-09 ENCOUNTER — Emergency Department (HOSPITAL_COMMUNITY)
Admission: EM | Admit: 2010-10-09 | Discharge: 2010-10-09 | Disposition: A | Discharge status: Home / Self Care | Payer: Self-pay | Attending: Emergency Medicine | Admitting: Emergency Medicine

## 2010-10-09 DIAGNOSIS — R1013 Epigastric pain: Secondary | ICD-10-CM | POA: Insufficient documentation

## 2010-10-09 DIAGNOSIS — K219 Gastro-esophageal reflux disease without esophagitis: Secondary | ICD-10-CM | POA: Insufficient documentation

## 2010-10-09 DIAGNOSIS — R112 Nausea with vomiting, unspecified: Secondary | ICD-10-CM | POA: Insufficient documentation

## 2010-10-09 LAB — CBC
HCT: 38.5 % — ABNORMAL LOW (ref 39.0–52.0)
MCH: 32.9 pg (ref 26.0–34.0)
MCHC: 35.8 g/dL (ref 30.0–36.0)
MCV: 91.9 fL (ref 78.0–100.0)
RDW: 11.6 % (ref 11.5–15.5)

## 2010-10-09 LAB — DIFFERENTIAL
Eosinophils Relative: 0 % (ref 0–5)
Lymphocytes Relative: 5 % — ABNORMAL LOW (ref 12–46)
Lymphs Abs: 0.6 10*3/uL — ABNORMAL LOW (ref 0.7–4.0)
Monocytes Absolute: 0.4 10*3/uL (ref 0.1–1.0)
Monocytes Relative: 3 % (ref 3–12)

## 2010-10-09 LAB — COMPREHENSIVE METABOLIC PANEL
Alkaline Phosphatase: 82 U/L (ref 39–117)
BUN: 18 mg/dL (ref 6–23)
Calcium: 10.8 mg/dL — ABNORMAL HIGH (ref 8.4–10.5)
Glucose, Bld: 150 mg/dL — ABNORMAL HIGH (ref 70–99)
Potassium: 3.9 mEq/L (ref 3.5–5.1)
Total Protein: 8.3 g/dL (ref 6.0–8.3)

## 2010-10-09 LAB — LIPASE, BLOOD: Lipase: 11 U/L (ref 11–59)

## 2010-11-18 ENCOUNTER — Emergency Department (HOSPITAL_COMMUNITY)
Admission: EM | Admit: 2010-11-18 | Discharge: 2010-11-19 | Disposition: A | Discharge status: Home / Self Care | Payer: Self-pay | Attending: Emergency Medicine | Admitting: Emergency Medicine

## 2010-11-18 DIAGNOSIS — K219 Gastro-esophageal reflux disease without esophagitis: Secondary | ICD-10-CM | POA: Insufficient documentation

## 2010-11-18 DIAGNOSIS — R112 Nausea with vomiting, unspecified: Secondary | ICD-10-CM | POA: Insufficient documentation

## 2010-11-18 DIAGNOSIS — R109 Unspecified abdominal pain: Secondary | ICD-10-CM | POA: Insufficient documentation

## 2010-11-18 LAB — CBC
Hemoglobin: 12.4 g/dL — ABNORMAL LOW (ref 13.0–17.0)
MCH: 32.5 pg (ref 26.0–34.0)
RBC: 3.81 MIL/uL — ABNORMAL LOW (ref 4.22–5.81)
WBC: 12.2 10*3/uL — ABNORMAL HIGH (ref 4.0–10.5)

## 2010-11-18 LAB — DIFFERENTIAL
Basophils Absolute: 0 10*3/uL (ref 0.0–0.1)
Basophils Relative: 0 % (ref 0–1)
Monocytes Relative: 6 % (ref 3–12)
Neutro Abs: 9.8 10*3/uL — ABNORMAL HIGH (ref 1.7–7.7)
Neutrophils Relative %: 80 % — ABNORMAL HIGH (ref 43–77)

## 2010-11-18 LAB — POCT I-STAT, CHEM 8
BUN: 16 mg/dL (ref 6–23)
Sodium: 142 mEq/L (ref 135–145)
TCO2: 23 mmol/L (ref 0–100)

## 2010-11-19 ENCOUNTER — Emergency Department (HOSPITAL_COMMUNITY)
Admission: EM | Admit: 2010-11-19 | Discharge: 2010-11-19 | Disposition: A | Discharge status: Home / Self Care | Payer: Self-pay | Attending: Emergency Medicine | Admitting: Emergency Medicine

## 2010-11-19 DIAGNOSIS — R197 Diarrhea, unspecified: Secondary | ICD-10-CM | POA: Insufficient documentation

## 2010-11-19 DIAGNOSIS — R109 Unspecified abdominal pain: Secondary | ICD-10-CM | POA: Insufficient documentation

## 2010-11-19 DIAGNOSIS — R112 Nausea with vomiting, unspecified: Secondary | ICD-10-CM | POA: Insufficient documentation

## 2010-11-19 DIAGNOSIS — K219 Gastro-esophageal reflux disease without esophagitis: Secondary | ICD-10-CM | POA: Insufficient documentation

## 2010-11-19 DIAGNOSIS — R630 Anorexia: Secondary | ICD-10-CM | POA: Insufficient documentation

## 2010-11-19 LAB — URINALYSIS, ROUTINE W REFLEX MICROSCOPIC
Glucose, UA: NEGATIVE mg/dL
Leukocytes, UA: NEGATIVE
Specific Gravity, Urine: 1.021 (ref 1.005–1.030)
pH: 8.5 — ABNORMAL HIGH (ref 5.0–8.0)

## 2010-12-24 ENCOUNTER — Emergency Department (HOSPITAL_COMMUNITY)
Admission: EM | Admit: 2010-12-24 | Discharge: 2010-12-24 | Disposition: A | Discharge status: Home / Self Care | Payer: Self-pay | Attending: Emergency Medicine | Admitting: Emergency Medicine

## 2010-12-24 DIAGNOSIS — R1013 Epigastric pain: Secondary | ICD-10-CM | POA: Insufficient documentation

## 2010-12-24 DIAGNOSIS — R197 Diarrhea, unspecified: Secondary | ICD-10-CM | POA: Insufficient documentation

## 2010-12-24 DIAGNOSIS — K2970 Gastritis, unspecified, without bleeding: Secondary | ICD-10-CM | POA: Insufficient documentation

## 2010-12-24 DIAGNOSIS — R112 Nausea with vomiting, unspecified: Secondary | ICD-10-CM | POA: Insufficient documentation

## 2010-12-24 DIAGNOSIS — F1210 Cannabis abuse, uncomplicated: Secondary | ICD-10-CM | POA: Insufficient documentation

## 2010-12-24 DIAGNOSIS — K219 Gastro-esophageal reflux disease without esophagitis: Secondary | ICD-10-CM | POA: Insufficient documentation

## 2010-12-24 LAB — URINALYSIS, ROUTINE W REFLEX MICROSCOPIC
Bilirubin Urine: NEGATIVE
Ketones, ur: 15 mg/dL — AB
Nitrite: NEGATIVE
pH: 7 (ref 5.0–8.0)

## 2010-12-24 LAB — COMPREHENSIVE METABOLIC PANEL
ALT: 19 U/L (ref 0–53)
Albumin: 4.6 g/dL (ref 3.5–5.2)
Alkaline Phosphatase: 93 U/L (ref 39–117)
BUN: 19 mg/dL (ref 6–23)
Chloride: 100 mEq/L (ref 96–112)
GFR calc Af Amer: >60 mL/min (ref >60)
Glucose, Bld: 147 mg/dL — ABNORMAL HIGH (ref 70–99)
Potassium: 3.8 mEq/L (ref 3.5–5.1)
Sodium: 136 mEq/L (ref 135–145)
Total Bilirubin: 0.5 mg/dL (ref 0.3–1.2)

## 2010-12-24 LAB — CBC
HCT: 35.4 % — ABNORMAL LOW (ref 39.0–52.0)
Hemoglobin: 12.9 g/dL — ABNORMAL LOW (ref 13.0–17.0)
RBC: 3.78 MIL/uL — ABNORMAL LOW (ref 4.22–5.81)
WBC: 12.6 10*3/uL — ABNORMAL HIGH (ref 4.0–10.5)

## 2010-12-24 LAB — RAPID URINE DRUG SCREEN, HOSP PERFORMED
Cocaine: NOT DETECTED
Opiates: NOT DETECTED
Tetrahydrocannabinol: POSITIVE — AB

## 2010-12-24 LAB — LIPASE, BLOOD: Lipase: 21 U/L (ref 11–59)

## 2010-12-24 LAB — URINE MICROSCOPIC-ADD ON

## 2011-01-27 ENCOUNTER — Emergency Department (HOSPITAL_COMMUNITY)
Admission: EM | Admit: 2011-01-27 | Discharge: 2011-01-28 | Disposition: A | Discharge status: Home / Self Care | Payer: Self-pay | Attending: Emergency Medicine | Admitting: Emergency Medicine

## 2011-01-27 DIAGNOSIS — N509 Disorder of male genital organs, unspecified: Secondary | ICD-10-CM | POA: Insufficient documentation

## 2011-01-27 DIAGNOSIS — R112 Nausea with vomiting, unspecified: Secondary | ICD-10-CM | POA: Insufficient documentation

## 2011-01-27 DIAGNOSIS — K5900 Constipation, unspecified: Secondary | ICD-10-CM | POA: Insufficient documentation

## 2011-01-27 DIAGNOSIS — K219 Gastro-esophageal reflux disease without esophagitis: Secondary | ICD-10-CM | POA: Insufficient documentation

## 2011-01-27 DIAGNOSIS — R1013 Epigastric pain: Secondary | ICD-10-CM | POA: Insufficient documentation

## 2011-01-28 LAB — CBC
HCT: 35.6 % — ABNORMAL LOW (ref 39.0–52.0)
Hemoglobin: 13.3 g/dL (ref 13.0–17.0)
MCH: 33.8 pg (ref 26.0–34.0)
MCHC: 37.4 g/dL — ABNORMAL HIGH (ref 30.0–36.0)
MCV: 90.4 fL (ref 78.0–100.0)
Platelets: 112 10*3/uL — ABNORMAL LOW (ref 150–400)
RBC: 3.94 MIL/uL — ABNORMAL LOW (ref 4.22–5.81)
RDW: 11.6 % (ref 11.5–15.5)
WBC: 15.8 10*3/uL — ABNORMAL HIGH (ref 4.0–10.5)

## 2011-01-28 LAB — URINALYSIS, ROUTINE W REFLEX MICROSCOPIC
Bilirubin Urine: NEGATIVE
Glucose, UA: NEGATIVE mg/dL
Ketones, ur: 15 mg/dL — AB
Leukocytes, UA: NEGATIVE
Nitrite: NEGATIVE
Protein, ur: 30 mg/dL — AB
Specific Gravity, Urine: 1.024 (ref 1.005–1.030)
Urobilinogen, UA: 1 mg/dL (ref 0.0–1.0)
pH: 8.5 — ABNORMAL HIGH (ref 5.0–8.0)

## 2011-01-28 LAB — COMPREHENSIVE METABOLIC PANEL
ALT: 56 U/L — ABNORMAL HIGH (ref 0–53)
AST: 62 U/L — ABNORMAL HIGH (ref 0–37)
Albumin: 4.9 g/dL (ref 3.5–5.2)
Alkaline Phosphatase: 82 U/L (ref 39–117)
BUN: 19 mg/dL (ref 6–23)
CO2: 26 mEq/L (ref 19–32)
Calcium: 10.2 mg/dL (ref 8.4–10.5)
Chloride: 97 mEq/L (ref 96–112)
Creatinine, Ser: 0.95 mg/dL (ref 0.50–1.35)
GFR calc Af Amer: >60 mL/min (ref >60)
GFR calc non Af Amer: >60 mL/min (ref >60)
Glucose, Bld: 155 mg/dL — ABNORMAL HIGH (ref 70–99)
Potassium: 3.5 mEq/L (ref 3.5–5.1)
Sodium: 134 mEq/L — ABNORMAL LOW (ref 135–145)
Total Bilirubin: 0.4 mg/dL (ref 0.3–1.2)
Total Protein: 8.2 g/dL (ref 6.0–8.3)

## 2011-01-28 LAB — DIFFERENTIAL
Basophils Absolute: 0 10*3/uL (ref 0.0–0.1)
Basophils Relative: 0 % (ref 0–1)
Eosinophils Absolute: 0 10*3/uL (ref 0.0–0.7)
Eosinophils Relative: 0 % (ref 0–5)
Lymphocytes Relative: 5 % — ABNORMAL LOW (ref 12–46)
Lymphs Abs: 0.8 10*3/uL (ref 0.7–4.0)
Monocytes Absolute: 0.9 10*3/uL (ref 0.1–1.0)
Monocytes Relative: 6 % (ref 3–12)
Neutro Abs: 14 10*3/uL — ABNORMAL HIGH (ref 1.7–7.7)
Neutrophils Relative %: 89 % — ABNORMAL HIGH (ref 43–77)

## 2011-01-28 LAB — URINE MICROSCOPIC-ADD ON

## 2011-01-28 LAB — LIPASE, BLOOD: Lipase: 12 U/L (ref 11–59)

## 2011-01-29 ENCOUNTER — Inpatient Hospital Stay (HOSPITAL_COMMUNITY)
Admission: EM | Admit: 2011-01-29 | Discharge: 2011-01-31 | DRG: 392 | Disposition: A | Discharge status: Home / Self Care | Payer: Self-pay | Attending: Internal Medicine | Admitting: Internal Medicine

## 2011-01-29 DIAGNOSIS — D72829 Elevated white blood cell count, unspecified: Secondary | ICD-10-CM | POA: Diagnosis present

## 2011-01-29 DIAGNOSIS — R109 Unspecified abdominal pain: Secondary | ICD-10-CM | POA: Diagnosis present

## 2011-01-29 DIAGNOSIS — R1115 Cyclical vomiting syndrome unrelated to migraine: Principal | ICD-10-CM | POA: Diagnosis present

## 2011-01-29 DIAGNOSIS — E860 Dehydration: Secondary | ICD-10-CM | POA: Diagnosis present

## 2011-01-29 DIAGNOSIS — E876 Hypokalemia: Secondary | ICD-10-CM | POA: Diagnosis present

## 2011-01-29 LAB — BASIC METABOLIC PANEL
CO2: 22 mEq/L (ref 19–32)
Calcium: 10.1 mg/dL (ref 8.4–10.5)
Chloride: 95 mEq/L — ABNORMAL LOW (ref 96–112)
Glucose, Bld: 113 mg/dL — ABNORMAL HIGH (ref 70–99)
Sodium: 133 mEq/L — ABNORMAL LOW (ref 135–145)

## 2011-01-29 LAB — CBC
Hemoglobin: 13.4 g/dL (ref 13.0–17.0)
MCH: 33.3 pg (ref 26.0–34.0)
MCV: 91.3 fL (ref 78.0–100.0)
RBC: 4.03 MIL/uL — ABNORMAL LOW (ref 4.22–5.81)
WBC: 11.1 10*3/uL — ABNORMAL HIGH (ref 4.0–10.5)

## 2011-01-29 LAB — DIFFERENTIAL
Lymphs Abs: 1.1 10*3/uL (ref 0.7–4.0)
Monocytes Relative: 8 % (ref 3–12)
Neutro Abs: 9.1 10*3/uL — ABNORMAL HIGH (ref 1.7–7.7)
Neutrophils Relative %: 82 % — ABNORMAL HIGH (ref 43–77)

## 2011-01-29 LAB — HEPATIC FUNCTION PANEL
ALT: 48 U/L (ref 0–53)
Albumin: 4.8 g/dL (ref 3.5–5.2)
Alkaline Phosphatase: 82 U/L (ref 39–117)
Total Bilirubin: 0.7 mg/dL (ref 0.3–1.2)

## 2011-01-29 LAB — URINALYSIS, ROUTINE W REFLEX MICROSCOPIC
Glucose, UA: NEGATIVE mg/dL
Ketones, ur: >80 mg/dL — AB
Leukocytes, UA: NEGATIVE
Protein, ur: 30 mg/dL — AB
pH: 6 (ref 5.0–8.0)

## 2011-01-29 LAB — URINE MICROSCOPIC-ADD ON

## 2011-01-30 ENCOUNTER — Inpatient Hospital Stay (HOSPITAL_COMMUNITY): Payer: Self-pay

## 2011-01-30 LAB — CBC
HCT: 31.7 % — ABNORMAL LOW (ref 39.0–52.0)
Hemoglobin: 11.4 g/dL — ABNORMAL LOW (ref 13.0–17.0)
MCHC: 36 g/dL (ref 30.0–36.0)
RBC: 3.44 MIL/uL — ABNORMAL LOW (ref 4.22–5.81)
WBC: 7.5 10*3/uL (ref 4.0–10.5)

## 2011-01-30 LAB — BASIC METABOLIC PANEL
Chloride: 98 mEq/L (ref 96–112)
GFR calc Af Amer: >60 mL/min (ref >60)
GFR calc non Af Amer: >60 mL/min (ref >60)
Potassium: 3.6 mEq/L (ref 3.5–5.1)
Sodium: 132 mEq/L — ABNORMAL LOW (ref 135–145)

## 2011-01-30 NOTE — H&P (Signed)
NAMEVINTON, Erik Cowan            ACCOUNT NO.:  [id number]  MEDICAL RECORD NO.:  [id number]  LOCATION:  [facility]                         FACILITY:  [facility]  PHYSICIAN:  Gery Pray, MD      DATE OF BIRTH:  02-Oct-1985  DATE OF ADMISSION:  01/29/2011 DATE OF DISCHARGE:                             HISTORY & PHYSICAL   PRIMARY CARE PHYSICIAN:  None.  GASTROENTEROLOGY:  Barbette Hair. Arlyce Dice, M.D., [facility]  CODE STATUS:  Full code.  The patient goes to team #5.  CHIEF COMPLAINT:  Nausea and vomiting.  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  He states he has not had severe episode up until now, he has never been hospitalized with this.  He states that 3 days ago he started with severe nausea and vomiting, loss of retching.  He has not been able to keep anything down.  He did go to [facility] ER 2 days ago who was discharged with antiemetics, which he has been taking; however, he states in the interim, his symptoms has only gotten worse.  He states he has not urinated in 2 days.  He states that twice he did vomit, it was dark blood, however, over the past 24 hours, this has not recurred.  He last vomited approximately 2 or 3 hours ago.  He states he does have a history of heartburn.  He is currently on no medications for this.  He does see Dr. Arlyce Dice.  He had an EGD approximately 4 months ago, which was normal.  He was placed on Prilosec twice daily; however, it did not help any of his heartburn symptoms.  Therefore, he has since discontinued this.  He does not report any light-headedness.  He does report abdominal pain generalized. He reports no burning or urination.  He reports no fever.  He states he does have chills with his vomiting.  He has an occasional cough and some shortness of breath.  History obtained from the patient who appears reliable.  PAST MEDICAL HISTORY:  Cyclic vomiting.  PAST  SURGICAL HISTORY:  None.  MEDICATIONS:  Zofran p.r.n.  ALLERGIES:  None.  SOCIAL HISTORY:  Positive marijuana.  Negative alcohol or illicit drugs.  FAMILY HISTORY:  Significant for diabetes mellitus and cancer.  REVIEW OF SYSTEMS:  All 10-point systems reviewed are negative except as noted in HPI.  PHYSICAL EXAMINATION:  GENERAL:  Alert and oriented male. VITAL SIGNS:  Initial blood pressure 162/89, currently 127/66; pulse 55; respirations 20; temperature 98.4; saturating 96% on room air. HEENT:  Eyes, pink conjunctivae.  PERRLA.  ENT, dry oral mucosa. Trachea midline. NECK:  Supple. LUNGS:  Clear to auscultation bilaterally.  No use of accessory muscles. CARDIOVASCULAR:  Regular rate and rhythm without murmurs, rigors, or gallops.  No JVD. ABDOMEN:  Soft.  Positive bowel sounds.  No tenderness elicited.  No organomegaly.  Not acute surgical abdomen. NEUROLOGIC:  Cranial nerves II through XII grossly intact.  Sensation intact. MUSCULOSKELETAL:  Strength 5/5 in all extremities.  No clubbing, cyanosis, or edema. SKIN:  No rashes.  No subcutaneous crepitation. PSYCHIATRIC:  Alert and oriented.  APPROPRIATE LABORATORY  DATA:  UA is negative.  LFTs are normal.  Sodium 133, potassium 3.2, chloride 95, CO2 22, glucose 113, BUN 22, creatinine 1.01.  White blood count 11.1, hemoglobin 13.4, and platelets 156.  ASSESSMENT AND PLAN: 1. Cyclic vomiting. 2. Clinical dehydration.   3  Oliguria. The patient will be admitted.  We will start     the patient on some scheduled Zofran and Reglan for 24hours, then     change to p.r.n.  We will start the patient on Protonix IV, on a     clear liquid diet to be advanced as tolerated. p.r.n. pain     medications.  No GI consult requested as the patients has had no     evidence of bleeding in over 24 hours.  H and H stable.  We will     repeat his CBC in the a.m. Monitor UOP. 3. Marijuana use.  The patient has been advised that marijuana can      worsen his episodes of cyclic vomiting.          ______________________________ Gery Pray, MD     DC/MEDQ  D:  01/30/2011  T:  01/30/2011  Job:  [id number]  Electronically Signed by Gery Pray MD on 01/30/2011 04:26:26 AM

## 2011-01-31 LAB — CBC
Hemoglobin: 12 g/dL — ABNORMAL LOW (ref 13.0–17.0)
MCH: 33.2 pg (ref 26.0–34.0)
MCV: 90.6 fL (ref 78.0–100.0)
Platelets: 122 10*3/uL — ABNORMAL LOW (ref 150–400)
RBC: 3.61 MIL/uL — ABNORMAL LOW (ref 4.22–5.81)
WBC: 5.3 10*3/uL (ref 4.0–10.5)

## 2011-01-31 LAB — BASIC METABOLIC PANEL
CO2: 25 mEq/L (ref 19–32)
Calcium: 8.9 mg/dL (ref 8.4–10.5)
Chloride: 99 mEq/L (ref 96–112)
Creatinine, Ser: 0.96 mg/dL (ref 0.50–1.35)
Glucose, Bld: 93 mg/dL (ref 70–99)

## 2011-02-09 NOTE — Discharge Summary (Signed)
  NAMEUKIAH, Erik Cowan            ACCOUNT NO.:  [id number]  MEDICAL RECORD NO.:  [id number]  LOCATION:  1533                         FACILITY:  [facility]  PHYSICIAN:  Erick Blinks, MD     DATE OF BIRTH:  26-May-1986  DATE OF ADMISSION:  01/29/2011 DATE OF DISCHARGE:  01/31/2011                              DISCHARGE SUMMARY   PRIMARY CARE PHYSICIAN:  Patient does not have primary care physician.  DISCHARGE DIAGNOSES: 1. Recurrent vomiting with abdominal pain. 2. Possible cyclic vomiting syndrome. 3. Dehydration, resolved. 4. Hypokalemia, improved. 5. Leukocytosis, resolved.  DISCHARGE MEDICATIONS: 1. Hydrocodone/APAP 5/325 mg 1 tablet p.o. q.6 hours p.r.n. 2. Zofran 4 mg p.o. q.6 hours p.r.n. 3. Bentyl  20 mg p.o. q.i.d. p.r.n. 4. Protonix 40 mg p.o. daily.  ADMISSION HISTORY:  This is a 25 year old gentleman with chronic abdominal pain and recurrent vomiting since childhood.  Patient reports that 3 days prior to admission, he started to develop nausea and vomiting.  He reported to the [facility] ER 2 days ago where he was discharged with antiemetics.  He had persistence of his symptoms.  He came back to the emergency room for evaluation.  Of note, he had an EGD approximately 4 months ago which was found to be normal.  He was placed on Prilosec twice daily; however, it did not help any of his heartburn symptoms; therefore, he was not taking it.  He was found to be dehydrated in the emergency room and was subsequently admitted for further evaluation and treatment.  For details, please refer to the history and physical per Dr. Joneen Roach on September 1.  HOSPITAL COURSE: 1. Abdominal pain with vomiting.  Patient was placed on supportive     treatment with antiemetics.  He was also started on dicyclomine and     his diet was slowly advanced.  Currently, he is eating small     amounts of solid food.  He has not had any further vomiting.  He     does report that he does still feel  nauseous, but has not in fact     throwing up.  His symptoms have somewhat improved.  His dehydration     is also better with IV fluids.  If he tolerates lunch, then he will     be discharged home later today.  He is recommended to follow up     with Dr. Arlyce Dice, and we will ask case management to see and set up     with primary care physician.  Patient may continue on a regular     diet as tolerated and activities also as tolerated.  CONDITION AT TIME OF DISCHARGE:  Improved.  DISCHARGE INSTRUCTIONS:  Patient may continue on a regular diet. Activities as tolerated.  Follow up with Dr. Arlyce Dice in the next 1 to 2 weeks, and he will be seen by case management to set him up with primary care physician.     Erick Blinks, MD     JM/MEDQ  D:  01/31/2011  T:  01/31/2011  Job:  [id number]  Electronically Signed by Durward Mallard MEMON  on 02/09/2011 09:53:01 PM

## 2011-02-23 LAB — DIFFERENTIAL
Basophils Absolute: 0
Basophils Relative: 0
Eosinophils Absolute: 0
Eosinophils Relative: 0
Lymphocytes Relative: 3 — ABNORMAL LOW
Monocytes Absolute: 0.4

## 2011-02-23 LAB — CBC
Platelets: 111 — ABNORMAL LOW
RBC: 4.23
WBC: 7.4

## 2011-02-23 LAB — COMPREHENSIVE METABOLIC PANEL
ALT: 25
AST: 38 — ABNORMAL HIGH
Albumin: 4.4
Alkaline Phosphatase: 90
CO2: 27
Chloride: 97
GFR calc Af Amer: >60
Potassium: 4
Sodium: 134 — ABNORMAL LOW
Total Bilirubin: 1.1

## 2011-02-23 LAB — URINALYSIS, ROUTINE W REFLEX MICROSCOPIC
Glucose, UA: NEGATIVE
Ketones, ur: 15 — AB
Leukocytes, UA: NEGATIVE
pH: 7

## 2011-02-23 LAB — URINE MICROSCOPIC-ADD ON

## 2011-06-01 ENCOUNTER — Emergency Department (HOSPITAL_COMMUNITY)
Admission: EM | Admit: 2011-06-01 | Discharge: 2011-06-01 | Disposition: A | Discharge status: Home / Self Care | Payer: Self-pay | Attending: Emergency Medicine | Admitting: Emergency Medicine

## 2011-06-01 ENCOUNTER — Encounter (HOSPITAL_COMMUNITY): Payer: Self-pay | Admitting: Emergency Medicine

## 2011-06-01 ENCOUNTER — Emergency Department (HOSPITAL_COMMUNITY): Payer: Self-pay

## 2011-06-01 DIAGNOSIS — R10817 Generalized abdominal tenderness: Secondary | ICD-10-CM | POA: Insufficient documentation

## 2011-06-01 DIAGNOSIS — R1084 Generalized abdominal pain: Secondary | ICD-10-CM | POA: Insufficient documentation

## 2011-06-01 DIAGNOSIS — R112 Nausea with vomiting, unspecified: Secondary | ICD-10-CM | POA: Insufficient documentation

## 2011-06-01 DIAGNOSIS — G8929 Other chronic pain: Secondary | ICD-10-CM | POA: Insufficient documentation

## 2011-06-01 LAB — DIFFERENTIAL
Basophils Absolute: 0 10*3/uL (ref 0.0–0.1)
Basophils Relative: 0 % (ref 0–1)
Eosinophils Relative: 0 % (ref 0–5)
Monocytes Absolute: 0.3 10*3/uL (ref 0.1–1.0)

## 2011-06-01 LAB — URINALYSIS, ROUTINE W REFLEX MICROSCOPIC
Glucose, UA: NEGATIVE mg/dL
Leukocytes, UA: NEGATIVE
Protein, ur: NEGATIVE mg/dL
Specific Gravity, Urine: 1.021 (ref 1.005–1.030)
pH: 8 (ref 5.0–8.0)

## 2011-06-01 LAB — CBC
HCT: 34.9 % — ABNORMAL LOW (ref 39.0–52.0)
MCHC: 36.7 g/dL — ABNORMAL HIGH (ref 30.0–36.0)
MCV: 90.6 fL (ref 78.0–100.0)
Platelets: 141 10*3/uL — ABNORMAL LOW (ref 150–400)
RDW: 11.7 % (ref 11.5–15.5)

## 2011-06-01 LAB — COMPREHENSIVE METABOLIC PANEL
AST: 19 U/L (ref 0–37)
Albumin: 4.8 g/dL (ref 3.5–5.2)
Calcium: 10 mg/dL (ref 8.4–10.5)
Creatinine, Ser: 0.83 mg/dL (ref 0.50–1.35)
GFR calc non Af Amer: >90 mL/min (ref >90)
Total Protein: 8 g/dL (ref 6.0–8.3)

## 2011-06-01 LAB — URINE MICROSCOPIC-ADD ON

## 2011-06-01 MED ORDER — HYDROCODONE-ACETAMINOPHEN 5-325 MG PO TABS: 1.0000 | ORAL_TABLET | Freq: Four times a day (QID) | ORAL | Status: DC | PRN

## 2011-06-01 MED ORDER — ONDANSETRON 8 MG PO TBDP: 8.0000 mg | ORAL_TABLET | Freq: Three times a day (TID) | ORAL | Status: DC | PRN

## 2011-06-01 MED ORDER — HYDROMORPHONE HCL PF 1 MG/ML IJ SOLN
1.0000 mg | Freq: Once | INTRAMUSCULAR | Status: AC
  Administered 2011-06-01: 1 mg via INTRAVENOUS
  Filled 2011-06-01: qty 1

## 2011-06-01 MED ORDER — ONDANSETRON HCL 4 MG/2ML IJ SOLN
4.0000 mg | Freq: Once | INTRAMUSCULAR | Status: AC
  Administered 2011-06-01: 4 mg via INTRAVENOUS
  Filled 2011-06-01: qty 2

## 2011-06-01 MED ORDER — SODIUM CHLORIDE 0.9 % IV BOLUS (SEPSIS)
1000.0000 mL | Freq: Once | INTRAVENOUS | Status: AC
  Administered 2011-06-01: 1000 mL via INTRAVENOUS

## 2011-06-01 MED ORDER — PROMETHAZINE HCL 25 MG/ML IJ SOLN
12.5000 mg | Freq: Once | INTRAMUSCULAR | Status: AC
  Administered 2011-06-01: 12.5 mg via INTRAVENOUS
  Filled 2011-06-01: qty 1

## 2011-06-01 NOTE — ED Provider Notes (Signed)
History     CSN: [id number]  Arrival date & time 06/01/11  1711   First MD Initiated Contact with Patient 06/01/11 1817     HPI Patient reports a history of chronic abdominal pain. Reports currently his abdominal pain began yesterday. Reports pain is everywhere. Associated with nausea and vomiting. States pain is typical for his chronic pain. Reports followup with Dr. Arlyce Dice last year. Had an EGD that was normal as well as 4 office visits. No cause of the problem it was found. Reports since then has had intermittent pain last episode was several months ago. Denies dysuria, hematuria, frequency, diarrhea, constipation, fever, back pain, chest pain, shortness breath. Denies positive sick or food contacts , or recent travel. Patient is a 26 y.o. male presenting with abdominal pain.  Abdominal Pain The primary symptoms of the illness include abdominal pain, nausea and vomiting. The primary symptoms of the illness do not include fever, shortness of breath, diarrhea or dysuria. The current episode started yesterday. The onset of the illness was gradual. The problem has been gradually worsening.  The abdominal pain is generalized. The abdominal pain does not radiate. The abdominal pain is relieved by eating and drinking (palpation).  The nausea is associated with eating. The nausea is exacerbated by food.  The patient has not had a change in bowel habit. Symptoms associated with the illness do not include chills, constipation, hematuria or back pain.    Past Medical History  Diagnosis Date  . GERD (gastroesophageal reflux disease)     Past Surgical History  Procedure Date  . Unremarkable     Family History  Problem Relation Age of Onset  . Colon cancer Maternal Uncle   . Liver cancer Mother   . Diabetes Maternal Aunt   . Heart disease Paternal Grandfather     History  Substance Use Topics  . Smoking status: Former Games developer  . Smokeless tobacco: Not on file   Comment: see illicit drug  comment  . Alcohol Use: Yes     occasional-once per month      Review of Systems  Constitutional: Negative for fever and chills.  Respiratory: Negative for shortness of breath.   Cardiovascular: Negative for chest pain.  Gastrointestinal: Positive for nausea, vomiting and abdominal pain. Negative for diarrhea, constipation, blood in stool and rectal pain.  Genitourinary: Negative for dysuria, hematuria, flank pain, discharge, penile pain and testicular pain.  Musculoskeletal: Negative for back pain.  Neurological: Negative for dizziness, weakness, numbness and headaches.  All other systems reviewed and are negative.    Allergies  Review of patient's allergies indicates no known allergies.  Home Medications   Current Outpatient Rx  Name Route Sig Dispense Refill  . HYOSCYAMINE SULFATE 0.125 MG PO TABS Oral Take 0.125 mg by mouth 2 (two) times daily.      Marland Kitchen METOCLOPRAMIDE HCL 10 MG PO TABS Oral Take 10 mg by mouth daily.      Marland Kitchen OMEPRAZOLE MAGNESIUM 20 MG PO TBEC Oral Take 20 mg by mouth daily as needed. heartburn    . OXYCODONE-ACETAMINOPHEN 5-325 MG PO TABS Oral Take 1 tablet by mouth every 4 (four) hours as needed. pain       BP 155/78  Pulse 55  Temp(Src) 98.1 F (36.7 C) (Oral)  Resp 18  SpO2 100%  Physical Exam  Constitutional: He is oriented to person, place, and time. He appears well-developed and well-nourished.  HENT:  Head: Normocephalic and atraumatic.  Eyes: Conjunctivae are normal. Pupils are  equal, round, and reactive to light.  Neck: Normal range of motion. Neck supple.  Cardiovascular: Normal rate, regular rhythm and normal heart sounds.  Exam reveals no friction rub.   No murmur heard. Pulmonary/Chest: Effort normal and breath sounds normal. He has no wheezes. He has no rales. He exhibits no tenderness.  Abdominal: Soft. Normal appearance and bowel sounds are normal. There is no hepatosplenomegaly. There is generalized tenderness. There is no rigidity,  no rebound, no guarding, no CVA tenderness, no tenderness at McBurney's point and negative Murphy's sign.  Neurological: He is alert and oriented to person, place, and time.  Skin: Skin is warm and dry. No rash noted. No erythema. No pallor.  Psychiatric: He has a normal mood and affect. His behavior is normal.    ED Course  Procedures   Results for orders placed during the hospital encounter of 06/01/11  CBC      Component Value Range   WBC 8.0  4.0 - 10.5 (K/uL)   RBC 3.85 (*) 4.22 - 5.81 (MIL/uL)   Hemoglobin 12.8 (*) 13.0 - 17.0 (g/dL)   HCT 16.1 (*) 09.6 - 52.0 (%)   MCV 90.6  78.0 - 100.0 (fL)   MCH 33.2  26.0 - 34.0 (pg)   MCHC 36.7 (*) 30.0 - 36.0 (g/dL)   RDW 04.5  40.9 - 81.1 (%)   Platelets 141 (*) 150 - 400 (K/uL)  DIFFERENTIAL      Component Value Range   Neutrophils Relative 90 (*) 43 - 77 (%)   Neutro Abs 7.2  1.7 - 7.7 (K/uL)   Lymphocytes Relative 6 (*) 12 - 46 (%)   Lymphs Abs 0.5 (*) 0.7 - 4.0 (K/uL)   Monocytes Relative 4  3 - 12 (%)   Monocytes Absolute 0.3  0.1 - 1.0 (K/uL)   Eosinophils Relative 0  0 - 5 (%)   Eosinophils Absolute 0.0  0.0 - 0.7 (K/uL)   Basophils Relative 0  0 - 1 (%)   Basophils Absolute 0.0  0.0 - 0.1 (K/uL)  COMPREHENSIVE METABOLIC PANEL      Component Value Range   Sodium 136  135 - 145 (mEq/L)   Potassium 3.9  3.5 - 5.1 (mEq/L)   Chloride 100  96 - 112 (mEq/L)   CO2 24  19 - 32 (mEq/L)   Glucose, Bld 133 (*) 70 - 99 (mg/dL)   BUN 13  6 - 23 (mg/dL)   Creatinine, Ser 9.14  0.50 - 1.35 (mg/dL)   Calcium 78.2  8.4 - 10.5 (mg/dL)   Total Protein 8.0  6.0 - 8.3 (g/dL)   Albumin 4.8  3.5 - 5.2 (g/dL)   AST 19  0 - 37 (U/L)   ALT 16  0 - 53 (U/L)   Alkaline Phosphatase 75  39 - 117 (U/L)   Total Bilirubin 0.4  0.3 - 1.2 (mg/dL)   GFR calc non Af Amer >90  >90 (mL/min)   GFR calc Af Amer >90  >90 (mL/min)  LIPASE, BLOOD      Component Value Range   Lipase 12  11 - 59 (U/L)   Dg Abd Acute W/chest  06/01/2011  *RADIOLOGY  REPORT*  Clinical Data: Abdominal pain.  Nausea, vomiting, fever.  ACUTE ABDOMEN SERIES (ABDOMEN 2 VIEW & CHEST 1 VIEW)  Comparison: January 30, 2011  Findings: Cardiomediastinal silhouette is within the limits.  Lungs are free of focal consolidations and pleural effusions.  No edema.  Supine and erect views of  the abdomen show a nonobstructive bowel gas pattern.  Small calcification overlies the expected contour of the right kidney, measuring 4 mm in diameter. Visualized osseous structures have a normal appearance.  IMPRESSION:  1. No evidence for acute cardiopulmonary abnormality. 2.  nonobstructive bowel gas pattern. 3.  Probable right intrarenal calculus, 4 mm in diameter.  Original Report Authenticated By: Patterson Hammersmith, M.D.      MDM  Discussed labs with patient and family. Strongly advised further followup as he does not have any emergent causes of abdominal pain here in the ED. Upon preparing to discharge discovered a urine sample something,. Patient has a stable kidney stone as unchanged for several months now. Will obtain urine sample advised patient that if positive for urinary tract infection he will receive a call for antibiotics patient is otherwise ready to be discharged. Low suspicion tract infection as he does not have dysuria, hematuria, back pain, frequency or urgency the        Thomasene Lot, [place] 06/01/11 2103

## 2011-06-02 NOTE — ED Provider Notes (Signed)
Medical screening examination/treatment/procedure(s) were performed by non-physician practitioner and as supervising physician I was immediately available for consultation/collaboration.    Chia Mowers L Kasmira Cacioppo, MD 06/02/11 1304 

## 2011-06-03 ENCOUNTER — Emergency Department (HOSPITAL_COMMUNITY)
Admission: EM | Admit: 2011-06-03 | Discharge: 2011-06-03 | Disposition: A | Discharge status: Home / Self Care | Payer: Self-pay | Attending: Emergency Medicine | Admitting: Emergency Medicine

## 2011-06-03 ENCOUNTER — Encounter (HOSPITAL_COMMUNITY): Payer: Self-pay | Admitting: *Deleted

## 2011-06-03 DIAGNOSIS — R1115 Cyclical vomiting syndrome unrelated to migraine: Secondary | ICD-10-CM | POA: Insufficient documentation

## 2011-06-03 DIAGNOSIS — K219 Gastro-esophageal reflux disease without esophagitis: Secondary | ICD-10-CM | POA: Insufficient documentation

## 2011-06-03 DIAGNOSIS — R1084 Generalized abdominal pain: Secondary | ICD-10-CM | POA: Insufficient documentation

## 2011-06-03 DIAGNOSIS — R509 Fever, unspecified: Secondary | ICD-10-CM | POA: Insufficient documentation

## 2011-06-03 LAB — CBC
MCH: 32.6 pg (ref 26.0–34.0)
MCHC: 36.3 g/dL — ABNORMAL HIGH (ref 30.0–36.0)
Platelets: 165 10*3/uL (ref 150–400)
RBC: 3.89 MIL/uL — ABNORMAL LOW (ref 4.22–5.81)

## 2011-06-03 LAB — BASIC METABOLIC PANEL
Calcium: 9.5 mg/dL (ref 8.4–10.5)
GFR calc non Af Amer: >90 mL/min (ref >90)
Glucose, Bld: 115 mg/dL — ABNORMAL HIGH (ref 70–99)
Sodium: 131 mEq/L — ABNORMAL LOW (ref 135–145)

## 2011-06-03 MED ORDER — PROMETHAZINE HCL 25 MG PO TABS: 25.0000 mg | ORAL_TABLET | Freq: Four times a day (QID) | ORAL | Status: DC | PRN

## 2011-06-03 MED ORDER — HYDROMORPHONE HCL 2 MG PO TABS: 2.0000 mg | ORAL_TABLET | ORAL | Status: DC | PRN

## 2011-06-03 MED ORDER — ONDANSETRON HCL 4 MG/2ML IJ SOLN
4.0000 mg | Freq: Once | INTRAMUSCULAR | Status: AC
  Administered 2011-06-03: 4 mg via INTRAVENOUS
  Filled 2011-06-03: qty 2

## 2011-06-03 MED ORDER — HYDROMORPHONE HCL PF 1 MG/ML IJ SOLN
1.0000 mg | Freq: Once | INTRAMUSCULAR | Status: AC
  Administered 2011-06-03: 1 mg via INTRAVENOUS
  Filled 2011-06-03: qty 1

## 2011-06-03 MED ORDER — SODIUM CHLORIDE 0.9 % IV SOLN: INTRAVENOUS | Status: DC

## 2011-06-03 MED ORDER — PROMETHAZINE HCL 25 MG/ML IJ SOLN
12.5000 mg | Freq: Once | INTRAMUSCULAR | Status: AC
  Administered 2011-06-03: 12.5 mg via INTRAVENOUS
  Filled 2011-06-03: qty 1

## 2011-06-03 NOTE — ED Notes (Signed)
Sent lite green, Lavender, and dark green to lad

## 2011-06-03 NOTE — ED Notes (Signed)
Pt states "this happens about twice a month, have been told in the ER that it's cyclic vomiting, I don't have a primary MD"

## 2011-06-03 NOTE — ED Notes (Signed)
N/V/D started 4 days ago, was seen and treated in emergency room no relief of symptoms. Patient also complains of abdominal pain

## 2011-06-03 NOTE — ED Provider Notes (Signed)
History     CSN: [id number]  Arrival date & time 06/03/11  0917   First MD Initiated Contact with Patient 06/03/11 0932      Chief Complaint  Patient presents with  . Nausea  . Abdominal Pain    (Consider location/radiation/quality/duration/timing/severity/associated sxs/prior treatment) Patient is a 26 y.o. male presenting with abdominal pain. The history is provided by the patient.  Abdominal Pain The primary symptoms of the illness include abdominal pain, nausea and vomiting. The primary symptoms of the illness do not include fever, shortness of breath, diarrhea, hematemesis, hematochezia or dysuria. The current episode started 2 days ago. The onset of the illness was gradual. The problem has been gradually worsening.  The abdominal pain began more than 2 days ago. The pain came on gradually. The abdominal pain has been gradually worsening since its onset. The abdominal pain is generalized. The abdominal pain does not radiate. The severity of the abdominal pain is 10/10.  Symptoms associated with the illness do not include chills or back pain.   Patient seen for same on January 1 patient most likely has a history of cyclic vomiting syndrome and followed by GI medicine in the past was discharged home on hydrocodone and Zofran ODT, patient already on Prilosec. Symptoms are associated with crampy abdominal pain. In the past the syndrome has been broken with IV pain meds.    Past Medical History  Diagnosis Date  . GERD (gastroesophageal reflux disease)     Past Surgical History  Procedure Date  . Unremarkable     Family History  Problem Relation Age of Onset  . Colon cancer Maternal Uncle   . Liver cancer Mother   . Diabetes Maternal Aunt   . Heart disease Paternal Grandfather     History  Substance Use Topics  . Smoking status: Former Games developer  . Smokeless tobacco: Not on file   Comment: see illicit drug comment  . Alcohol Use: Yes     occasional-once per month       Review of Systems  Constitutional: Negative for fever and chills.  HENT: Negative for congestion and neck pain.   Eyes: Negative for redness.  Respiratory: Negative for cough and shortness of breath.   Cardiovascular: Negative for chest pain.  Gastrointestinal: Positive for nausea, vomiting and abdominal pain. Negative for diarrhea, hematochezia and hematemesis.  Genitourinary: Negative for dysuria.  Musculoskeletal: Negative for back pain.  Skin: Negative for rash.  Neurological: Negative for headaches.  Hematological: Does not bruise/bleed easily.    Allergies  Review of patient's allergies indicates no known allergies.  Home Medications   Current Outpatient Rx  Name Route Sig Dispense Refill  . HYDROCODONE-ACETAMINOPHEN 5-325 MG PO TABS Oral Take 1-2 tablets by mouth every 6 (six) hours as needed for pain. 15 tablet 0  . OMEPRAZOLE MAGNESIUM 20 MG PO TBEC Oral Take 20 mg by mouth daily as needed. heartburn    . ONDANSETRON 8 MG PO TBDP Oral Take 1 tablet (8 mg total) by mouth every 8 (eight) hours as needed for nausea. 20 tablet 0  . HYDROMORPHONE HCL 2 MG PO TABS Oral Take 1 tablet (2 mg total) by mouth every 4 (four) hours as needed for pain. 20 tablet 0  . OXYCODONE-ACETAMINOPHEN 5-325 MG PO TABS Oral Take 1 tablet by mouth every 4 (four) hours as needed. pain     . PROMETHAZINE HCL 25 MG PO TABS Oral Take 1 tablet (25 mg total) by mouth every 6 (six) hours as  needed for nausea. 20 tablet 0    BP 117/56  Pulse 62  Temp(Src) 98.5 F (36.9 C) (Oral)  Resp 18  Wt 155 lb (70.308 kg)  SpO2 96%  Physical Exam  Nursing note and vitals reviewed. Constitutional: He is oriented to person, place, and time. He appears well-developed and well-nourished.  HENT:  Head: Normocephalic and atraumatic.  Mouth/Throat: Oropharynx is clear and moist.  Eyes: Conjunctivae are normal. Pupils are equal, round, and reactive to light.  Neck: Normal range of motion. Neck supple.   Cardiovascular: Normal rate, regular rhythm and normal heart sounds.   No murmur heard. Pulmonary/Chest: Effort normal and breath sounds normal.  Abdominal: Soft. Bowel sounds are normal. There is no tenderness.  Musculoskeletal: Normal range of motion.  Neurological: He is alert and oriented to person, place, and time. No cranial nerve deficit. He exhibits normal muscle tone. Coordination normal.  Skin: Skin is warm. No rash noted.    ED Course  Procedures (including critical care time)  Labs Reviewed  BASIC METABOLIC PANEL - Abnormal; Notable for the following:    Sodium 131 (*)    Chloride 95 (*)    Glucose, Bld 115 (*)    All other components within normal limits  CBC - Abnormal; Notable for the following:    RBC 3.89 (*)    Hemoglobin 12.7 (*)    HCT 35.0 (*)    MCHC 36.3 (*)    RDW 11.3 (*)    All other components within normal limits   Dg Abd Acute W/chest  06/01/2011  *RADIOLOGY REPORT*  Clinical Data: Abdominal pain.  Nausea, vomiting, fever.  ACUTE ABDOMEN SERIES (ABDOMEN 2 VIEW & CHEST 1 VIEW)  Comparison: January 30, 2011  Findings: Cardiomediastinal silhouette is within the limits.  Lungs are free of focal consolidations and pleural effusions.  No edema.  Supine and erect views of the abdomen show a nonobstructive bowel gas pattern.  Small calcification overlies the expected contour of the right kidney, measuring 4 mm in diameter. Visualized osseous structures have a normal appearance.  IMPRESSION:  1. No evidence for acute cardiopulmonary abnormality. 2.  nonobstructive bowel gas pattern. 3.  Probable right intrarenal calculus, 4 mm in diameter.  Original Report Authenticated By: Patterson Hammersmith, M.D.   Results for orders placed during the hospital encounter of 06/03/11  BASIC METABOLIC PANEL      Component Value Range   Sodium 131 (*) 135 - 145 (mEq/L)   Potassium 3.5  3.5 - 5.1 (mEq/L)   Chloride 95 (*) 96 - 112 (mEq/L)   CO2 25  19 - 32 (mEq/L)   Glucose, Bld  115 (*) 70 - 99 (mg/dL)   BUN 10  6 - 23 (mg/dL)   Creatinine, Ser 0.45  0.50 - 1.35 (mg/dL)   Calcium 9.5  8.4 - 40.9 (mg/dL)   GFR calc non Af Amer >90  >90 (mL/min)   GFR calc Af Amer >90  >90 (mL/min)  CBC      Component Value Range   WBC 6.0  4.0 - 10.5 (K/uL)   RBC 3.89 (*) 4.22 - 5.81 (MIL/uL)   Hemoglobin 12.7 (*) 13.0 - 17.0 (g/dL)   HCT 81.1 (*) 91.4 - 52.0 (%)   MCV 90.0  78.0 - 100.0 (fL)   MCH 32.6  26.0 - 34.0 (pg)   MCHC 36.3 (*) 30.0 - 36.0 (g/dL)   RDW 78.2 (*) 95.6 - 15.5 (%)   Platelets 165  150 - 400 (  K/uL)     1. Cyclic vomiting syndrome       MDM   Patient most likely with cyclic vomiting syndrome improved in the emergency department with several doses of IV dilaudid.  Has followup with Dr. Arlyce Dice as needed. Will try by mouth dilaudid, in place of the hydrocodone which may be contributing to some of the nausea and vomiting.       Shelda Jakes, MD 06/03/11 1346

## 2011-06-03 NOTE — ED Notes (Signed)
Pt calling out requesting pain meds md notified and aware

## 2011-06-03 NOTE — ED Notes (Signed)
Patient verbalized understanding of  Discharge instructions and prescriptions

## 2011-06-04 ENCOUNTER — Emergency Department (HOSPITAL_COMMUNITY): Payer: Self-pay

## 2011-06-04 ENCOUNTER — Inpatient Hospital Stay (HOSPITAL_COMMUNITY)
Admission: EM | Admit: 2011-06-04 | Discharge: 2011-06-07 | DRG: 392 | Disposition: A | Discharge status: Home / Self Care | Payer: Self-pay | Attending: Internal Medicine | Admitting: Internal Medicine

## 2011-06-04 ENCOUNTER — Encounter (HOSPITAL_COMMUNITY): Payer: Self-pay

## 2011-06-04 DIAGNOSIS — T783XXA Angioneurotic edema, initial encounter: Secondary | ICD-10-CM | POA: Diagnosis present

## 2011-06-04 DIAGNOSIS — E871 Hypo-osmolality and hyponatremia: Secondary | ICD-10-CM | POA: Diagnosis present

## 2011-06-04 DIAGNOSIS — D72829 Elevated white blood cell count, unspecified: Secondary | ICD-10-CM | POA: Diagnosis present

## 2011-06-04 DIAGNOSIS — E876 Hypokalemia: Secondary | ICD-10-CM | POA: Diagnosis present

## 2011-06-04 DIAGNOSIS — N200 Calculus of kidney: Secondary | ICD-10-CM | POA: Diagnosis present

## 2011-06-04 DIAGNOSIS — R1110 Vomiting, unspecified: Secondary | ICD-10-CM

## 2011-06-04 DIAGNOSIS — R1013 Epigastric pain: Secondary | ICD-10-CM | POA: Diagnosis present

## 2011-06-04 DIAGNOSIS — R109 Unspecified abdominal pain: Secondary | ICD-10-CM | POA: Diagnosis present

## 2011-06-04 DIAGNOSIS — R1115 Cyclical vomiting syndrome unrelated to migraine: Principal | ICD-10-CM | POA: Diagnosis present

## 2011-06-04 DIAGNOSIS — K219 Gastro-esophageal reflux disease without esophagitis: Secondary | ICD-10-CM | POA: Diagnosis present

## 2011-06-04 HISTORY — DX: Cyclical vomiting syndrome unrelated to migraine: R11.15

## 2011-06-04 HISTORY — DX: Calculus of kidney: N20.0

## 2011-06-04 LAB — COMPREHENSIVE METABOLIC PANEL
ALT: 14 U/L (ref 0–53)
AST: 19 U/L (ref 0–37)
CO2: 24 mEq/L (ref 19–32)
Chloride: 95 mEq/L — ABNORMAL LOW (ref 96–112)
GFR calc non Af Amer: >90 mL/min (ref >90)
Potassium: 3.4 mEq/L — ABNORMAL LOW (ref 3.5–5.1)
Sodium: 132 mEq/L — ABNORMAL LOW (ref 135–145)
Total Bilirubin: 0.8 mg/dL (ref 0.3–1.2)

## 2011-06-04 LAB — CBC
HCT: 36.9 % — ABNORMAL LOW (ref 39.0–52.0)
Platelets: 194 10*3/uL (ref 150–400)
RDW: 11.4 % — ABNORMAL LOW (ref 11.5–15.5)
WBC: 15.5 10*3/uL — ABNORMAL HIGH (ref 4.0–10.5)

## 2011-06-04 LAB — URINALYSIS, ROUTINE W REFLEX MICROSCOPIC
Glucose, UA: NEGATIVE mg/dL
Hgb urine dipstick: NEGATIVE
Ketones, ur: 40 mg/dL — AB
Leukocytes, UA: NEGATIVE
Leukocytes, UA: NEGATIVE
Nitrite: NEGATIVE
Nitrite: NEGATIVE
Protein, ur: NEGATIVE mg/dL
Protein, ur: NEGATIVE mg/dL
Specific Gravity, Urine: 1.018 (ref 1.005–1.030)
Urobilinogen, UA: 0.2 mg/dL (ref 0.0–1.0)
Urobilinogen, UA: 0.2 mg/dL (ref 0.0–1.0)

## 2011-06-04 LAB — DIFFERENTIAL
Basophils Absolute: 0 10*3/uL (ref 0.0–0.1)
Lymphocytes Relative: 5 % — ABNORMAL LOW (ref 12–46)
Neutro Abs: 14.1 10*3/uL — ABNORMAL HIGH (ref 1.7–7.7)
Neutrophils Relative %: 91 % — ABNORMAL HIGH (ref 43–77)

## 2011-06-04 LAB — URINE MICROSCOPIC-ADD ON

## 2011-06-04 MED ORDER — ACETAMINOPHEN 650 MG RE SUPP: 650.0000 mg | Freq: Four times a day (QID) | RECTAL | Status: DC | PRN

## 2011-06-04 MED ORDER — OXYCODONE HCL 5 MG PO TABS
5.0000 mg | ORAL_TABLET | ORAL | Status: DC | PRN
  Administered 2011-06-04: 5 mg via ORAL
  Filled 2011-06-04: qty 1

## 2011-06-04 MED ORDER — PANTOPRAZOLE SODIUM 40 MG IV SOLR
40.0000 mg | Freq: Once | INTRAVENOUS | Status: AC
  Administered 2011-06-04: 40 mg via INTRAVENOUS
  Filled 2011-06-04: qty 40

## 2011-06-04 MED ORDER — ACETAMINOPHEN 325 MG PO TABS: 650.0000 mg | ORAL_TABLET | Freq: Four times a day (QID) | ORAL | Status: DC | PRN

## 2011-06-04 MED ORDER — ONDANSETRON HCL 4 MG/2ML IJ SOLN
4.0000 mg | Freq: Four times a day (QID) | INTRAMUSCULAR | Status: DC | PRN
  Administered 2011-06-04 – 2011-06-07 (×4): 4 mg via INTRAVENOUS
  Filled 2011-06-04 (×4): qty 2

## 2011-06-04 MED ORDER — DIPHENHYDRAMINE HCL 50 MG/ML IJ SOLN
25.0000 mg | Freq: Four times a day (QID) | INTRAMUSCULAR | Status: AC
  Administered 2011-06-04 – 2011-06-05 (×4): 25 mg via INTRAVENOUS
  Administered 2011-06-05: 23:00:00 via INTRAVENOUS
  Filled 2011-06-04 (×6): qty 1

## 2011-06-04 MED ORDER — METOCLOPRAMIDE HCL 5 MG/ML IJ SOLN
5.0000 mg | Freq: Four times a day (QID) | INTRAMUSCULAR | Status: DC
  Administered 2011-06-05 – 2011-06-06 (×6): 5 mg via INTRAVENOUS
  Filled 2011-06-04 (×10): qty 2

## 2011-06-04 MED ORDER — POTASSIUM CHLORIDE IN NACL 40-0.9 MEQ/L-% IV SOLN
INTRAVENOUS | Status: DC
  Administered 2011-06-04 – 2011-06-05 (×2): via INTRAVENOUS
  Administered 2011-06-05: 75 mL/h via INTRAVENOUS
  Administered 2011-06-07 (×2): via INTRAVENOUS
  Filled 2011-06-04 (×9): qty 1000

## 2011-06-04 MED ORDER — HYDROMORPHONE HCL PF 1 MG/ML IJ SOLN
1.0000 mg | Freq: Once | INTRAMUSCULAR | Status: AC
  Administered 2011-06-04: 1 mg via INTRAVENOUS
  Filled 2011-06-04: qty 1

## 2011-06-04 MED ORDER — SODIUM CHLORIDE 0.9 % IV BOLUS (SEPSIS)
1000.0000 mL | Freq: Once | INTRAVENOUS | Status: AC
  Administered 2011-06-04: 1000 mL via INTRAVENOUS

## 2011-06-04 MED ORDER — INFLUENZA VIRUS VACC SPLIT PF IM SUSP
0.5000 mL | INTRAMUSCULAR | Status: AC
  Administered 2011-06-05: 0.5 mL via INTRAMUSCULAR
  Filled 2011-06-04: qty 0.5

## 2011-06-04 MED ORDER — SODIUM CHLORIDE 0.45 % IV SOLN: INTRAVENOUS | Status: DC

## 2011-06-04 MED ORDER — MORPHINE SULFATE 2 MG/ML IJ SOLN
2.0000 mg | INTRAMUSCULAR | Status: DC | PRN
  Administered 2011-06-04 – 2011-06-06 (×4): 2 mg via INTRAVENOUS
  Filled 2011-06-04 (×5): qty 1

## 2011-06-04 MED ORDER — PANTOPRAZOLE SODIUM 40 MG IV SOLR
40.0000 mg | INTRAVENOUS | Status: DC
  Administered 2011-06-05 – 2011-06-07 (×3): 40 mg via INTRAVENOUS
  Filled 2011-06-04 (×3): qty 40

## 2011-06-04 MED ORDER — IOHEXOL 300 MG/ML  SOLN
100.0000 mL | Freq: Once | INTRAMUSCULAR | Status: AC | PRN
  Administered 2011-06-04: 100 mL via INTRAVENOUS

## 2011-06-04 MED ORDER — ONDANSETRON HCL 4 MG/2ML IJ SOLN
4.0000 mg | Freq: Once | INTRAMUSCULAR | Status: AC
  Administered 2011-06-04: 4 mg via INTRAVENOUS
  Filled 2011-06-04: qty 2

## 2011-06-04 MED ORDER — ONDANSETRON HCL 4 MG PO TABS: 4.0000 mg | ORAL_TABLET | Freq: Four times a day (QID) | ORAL | Status: DC | PRN

## 2011-06-04 NOTE — H&P (Signed)
Hospital Admission Note Date: 06/04/2011  Patient name: Erik Cowan Medical record number: [id number] Date of birth: [birth year] Age: 26 y.o. Gender: male PCP: Melvia Heaps, MD, MD, [facility]  Attending physician: Trula Ore Shruthi Northrup POA: Verlee Monte [phone] Code Status: Full  Chief Complaint: "Abdominal pain, vomiting"  History of Present Illness: Erik Cowan is an 26 y.o. male with a PMH of cyclic vomiting syndrome who has been evaluated in the ER 3 times in the past 4 days for abdominal pain and vomiting.  Zofran and phenergan have not helped the nausea and vomiting.  No frank hematemesis or melena/hematochezia.  Reports daily vomiting and the inabililty to keep anything down for the past 4 days.  Pain is currently intermittent, and mostly just superior to the umbilicus.  Pain is 10/10 at worst, currently 3/10 s/p pain medications.  Hot showers seem to make the pain better.  Prior work up has included EGD, GES and CT scans, which have been unrevealing.    Past Medical History Past Medical History  Diagnosis Date  . GERD (gastroesophageal reflux disease)   . Nephrolithiasis   . Cyclic vomiting syndrome     Past Surgical History Past Surgical History  Procedure Date  . Unremarkable     Meds: Prior to Admission medications   Medication Sig Start Date End Date Taking? Authorizing Provider  HYDROcodone-acetaminophen (NORCO) 5-325 MG per tablet Take 1-2 tablets by mouth every 6 (six) hours as needed for pain. 06/01/11 06/11/11 Yes Thomasene Lot, PA  HYDROmorphone (DILAUDID) 2 MG tablet Take 1 tablet (2 mg total) by mouth every 4 (four) hours as needed for pain. 06/03/11 06/13/11 Yes Shelda Jakes, MD  omeprazole (PRILOSEC OTC) 20 MG tablet Take 20 mg by mouth daily as needed. heartburn   Yes Historical Provider, MD  ondansetron (ZOFRAN ODT) 8 MG disintegrating tablet Take 1 tablet (8 mg total) by mouth every 8 (eight) hours as needed for nausea. 06/01/11 06/08/11 Yes Thomasene Lot, PA    oxyCODONE-acetaminophen (PERCOCET) 5-325 MG per tablet Take 1 tablet by mouth every 4 (four) hours as needed. pain    Yes Historical Provider, MD  promethazine (PHENERGAN) 25 MG tablet Take 1 tablet (25 mg total) by mouth every 6 (six) hours as needed for nausea. 06/03/11 06/10/11 Yes Shelda Jakes, MD    Allergies: Review of patient's allergies indicates no known allergies.  Social History: History   Social History  . Marital Status: Single    Spouse Name: N/A    Number of Children: N/A  . Years of Education: N/A   Occupational History  . [employer]     [employer]   Social History Main Topics  . Smoking status: Former Smoker -- 1 years    Types: Cigarettes    Quit date: 06/04/2007  . Smokeless tobacco: Never Used   Comment: see illicit drug comment  . Alcohol Use: Yes     occasional-once per month  . Drug Use: 7 per week     smokes marijuana daily  . Sexually Active: Not on file   Other Topics Concern  . Not on file   Social History Narrative   Single.  Lives with his Aunt. Works part-time as a Buyer, retail.    Family History:  Family History  Problem Relation Age of Onset  . Colon cancer Maternal Uncle   . Liver cancer Mother   . Diabetes Maternal Aunt   . Heart disease Paternal Grandfather   . Testicular cancer Brother  Review of Systems: Constitutional: +F/C earlier in week.  + Decreased appetite, + 10 pound weight loss in past 6 months.  HEENT: Some pharyngitis yesterday, feels better today.  CV: No chest pain or palpitations.  Resp: + SOB with vomiting episodes.  + Cough with episodes of "dry heaving".  GI: As per HPI.  GU: No dysuria or hematuria.  MSK: No myalgias/arthralgias.  Neuro: No headache.  Physical Exam: Blood pressure 149/81, pulse 45, temperature 98.7 F (37.1 C), temperature source Oral, resp. rate 12, SpO2 100.00%. BP 149/81  Pulse 45  Temp(Src) 98.7 F (37.1 C) (Oral)  Resp 12  SpO2 100%  General Appearance:    Alert,  cooperative, no distress, appears stated age  Head:    Normocephalic, without obvious abnormality, atraumatic  Eyes:    PERRL, conjunctiva/corneas clear, EOM's intact    Ears:    Normal external ear canals, both ears  Nose:   Nares normal, septum midline, mucosa normal, no drainage    or sinus tenderness  Throat:   Lips, mucosa, and tongue normal; teeth and gums normal  Neck:   Supple, symmetrical, trachea midline, no adenopathy;       thyroid:  No enlargement/tenderness/nodules; no carotid   bruit or JVD  Back:     Symmetric, no curvature, ROM normal, no CVA tenderness  Lungs:     Clear to auscultation bilaterally, respirations unlabored  Chest wall:    No tenderness or deformity  Heart:    Bradycardic rate and rhythm, S1 and S2 normal, no murmur, rub   or gallop  Abdomen:     Soft, tender in periumbilical area, bowel sounds active all four quadrants, no masses, no organomegaly  Extremities:   Extremities normal, atraumatic, no cyanosis or edema  Pulses:   2+ and symmetric all extremities  Skin:   Skin color, texture, turgor normal, no rashes or lesions  Lymph nodes:   Cervical, supraclavicular, and axillary nodes normal  Neurologic:   CNII-XII intact   Lab results: Basic Metabolic Panel:  Lab 06/04/11 0454 06/03/11 1000 06/01/11 1915  NA 132* 131* 136  K 3.4* 3.5 --  CL 95* 95* 100  CO2 24 25 24   GLUCOSE 107* 115* 133*  BUN 11 10 13   CREATININE 0.86 0.98 0.83  CALCIUM 9.6 9.5 10.0  MG -- -- --  PHOS -- -- --   GFR The CrCl is unknown because both a height and weight (above a minimum accepted value) are required for this calculation. Liver Function Tests:  Lab 06/04/11 1000 06/01/11 1915  AST 19 19  ALT 14 16  ALKPHOS 74 75  BILITOT 0.8 0.4  PROT 8.0 8.0  ALBUMIN 4.6 4.8    Lab 06/04/11 1000 06/01/11 1915  LIPASE 26 12  AMYLASE -- --    CBC:  Lab 06/04/11 1000 06/03/11 1000 06/01/11 1915  WBC 15.5* 6.0 8.0  NEUTROABS 14.1* -- 7.2  HGB 13.6 12.7* 12.8*  HCT  36.9* 35.0* 34.9*  MCV 88.5 90.0 90.6  PLT 194 165 141*    Imaging results:  Ct Abdomen Pelvis W Contrast  06/04/2011  *RADIOLOGY REPORT*  Clinical Data: Mid abdominal pain, nausea and vomiting.  CT ABDOMEN AND PELVIS WITH CONTRAST  Technique:  Multidetector CT imaging of the abdomen and pelvis was performed following the standard protocol during bolus administration of intravenous contrast.  Contrast: OMNIPAQUE IOHEXOL 300 MG/ML IV SOLN  Comparison: CT scan 04/24/2010.  Findings: The lung bases are clear.  The liver  demonstrates mild fatty infiltration.  No focal hepatic lesions or intrahepatic biliary dilatation.  The gallbladder is normal.  No common bile duct dilatation.  The pancreas is normal. The spleen is normal in size.  No focal lesions.  The adrenal glands are normal.  The left kidney is normal.  There are too stable right renal calculi but no obstructing ureteral calculi.  The stomach is under distended but no gross abnormalities are seen. The small bowel and colon are unremarkable.  No inflammatory changes or mass lesions.  The appendix is normal.  No mesenteric or retroperitoneal masses or lymphadenopathy.  The aorta is normal in caliber.  The major branch vessels are patent.  The bladder is mildly distended but otherwise normal.  No bladder calculi.  The prostate gland and seminal vesicles are normal. There is a small amount of free pelvic fluid which is unusual for a male but no obvious cause is identified.  No pelvic mass or adenopathy.  No inguinal mass or hernia.  The bony structures are unremarkable.  IMPRESSION:  1.  Stable right renal calculi.  No obstructing ureteral calculi. 2.  Small amount of free pelvic fluid of uncertain significance or etiology. 3.  Normal appendix.  Original Report Authenticated By: P. Loralie Champagne, M.D.    Assessment & Plan: Principal Problem:  *Cyclic vomiting syndrome/Abdominal pain, epigastric The patient has had an extensive evaluation by GI  regarding this.  He is s/p EGD, GES, as well as multiple CT scans that have all been unrevealing.  Will rule out intestinal angioedema with C1 esterase studies and serum tryptase.  Treat symptomatically for now. Active Problems:  GERD (gastroesophageal reflux disease) Will place on IV PPI therapy.  Nephrolithiasis Non-obstructing, and not likely the source of his abdominal pain or vomiting.  Hyponatremia Likely secondary to dehydration.  Start NS infusion.  Hypokalemia Likely secondary to GI losses.  Replete IV.  Leukocytosis May be secondary to UTI.  Mild bactiuria present on U/A.  Check urine culture.  Prophylaxis PAS hoses for DVT prophylaxis.  Time Spent On Admission: 45 minutes.  Jamyla Ard 06/04/2011, 3:55 PM Pager [phone]

## 2011-06-04 NOTE — Progress Notes (Signed)
ED CM consulted by Admission RN trouble getting medicines. CM noted no pcp, self pay pt.  Cm spoke with [facility] pharmacy staff who confirms pt is eligible for indigent medication assistance program. CM spoke with pt about DSS for medicaid application, financial counselor can see pt after 24 hrs to assist with medicaid application if needed, low cost pharmacies, www.needymeds.com for pt assistance programs, and local health department medication assistance programs. Provided with written information about the discussed resources plus housing, financial assistance. Etc Pt voiced understanding and appreciation of resources offered. Pt agreed to referral to [facility] for possible [facility] services.

## 2011-06-04 NOTE — ED Provider Notes (Signed)
History     CSN: [id number]  Arrival date & time 06/04/11  4098   First MD Initiated Contact with Patient 06/04/11 [id number]      Chief Complaint  Patient presents with  . Emesis    (Consider location/radiation/quality/duration/timing/severity/associated sxs/prior treatment) Patient is a 26 y.o. male presenting with vomiting. The history is provided by the patient (pt complains of vomiting and abd pain for a few days.  he was seen here 3 times in the last 4 days.  ).  Emesis  This is a new problem. The current episode started more than 2 days ago. The problem occurs more than 10 times per day. The problem has not changed since onset.The emesis has an appearance of stomach contents. There has been no fever. Associated symptoms include abdominal pain. Pertinent negatives include no chills, no cough, no diarrhea, no fever and no headaches. Risk factors: none.    Past Medical History  Diagnosis Date  . GERD (gastroesophageal reflux disease)     Past Surgical History  Procedure Date  . Unremarkable     Family History  Problem Relation Age of Onset  . Colon cancer Maternal Uncle   . Liver cancer Mother   . Diabetes Maternal Aunt   . Heart disease Paternal Grandfather     History  Substance Use Topics  . Smoking status: Former Games developer  . Smokeless tobacco: Not on file   Comment: see illicit drug comment  . Alcohol Use: Yes     occasional-once per month      Review of Systems  Constitutional: Negative for fever, chills and fatigue.  HENT: Negative for congestion, sinus pressure and ear discharge.   Eyes: Negative for discharge.  Respiratory: Negative for cough.   Cardiovascular: Negative for chest pain.  Gastrointestinal: Positive for nausea, vomiting and abdominal pain. Negative for diarrhea.  Genitourinary: Negative for frequency and hematuria.  Musculoskeletal: Negative for back pain.  Skin: Negative for rash.  Neurological: Negative for seizures and headaches.    Hematological: Negative.   Psychiatric/Behavioral: Negative for hallucinations.    Allergies  Review of patient's allergies indicates no known allergies.  Home Medications   Current Outpatient Rx  Name Route Sig Dispense Refill  . HYDROCODONE-ACETAMINOPHEN 5-325 MG PO TABS Oral Take 1-2 tablets by mouth every 6 (six) hours as needed for pain. 15 tablet 0  . HYDROMORPHONE HCL 2 MG PO TABS Oral Take 1 tablet (2 mg total) by mouth every 4 (four) hours as needed for pain. 20 tablet 0  . OMEPRAZOLE MAGNESIUM 20 MG PO TBEC Oral Take 20 mg by mouth daily as needed. heartburn    . ONDANSETRON 8 MG PO TBDP Oral Take 1 tablet (8 mg total) by mouth every 8 (eight) hours as needed for nausea. 20 tablet 0  . OXYCODONE-ACETAMINOPHEN 5-325 MG PO TABS Oral Take 1 tablet by mouth every 4 (four) hours as needed. pain     . PROMETHAZINE HCL 25 MG PO TABS Oral Take 1 tablet (25 mg total) by mouth every 6 (six) hours as needed for nausea. 20 tablet 0    BP 149/81  Pulse 45  Temp(Src) 98.7 F (37.1 C) (Oral)  Resp 12  SpO2 100%  Physical Exam  Constitutional: He is oriented to person, place, and time. He appears well-developed.  HENT:  Head: Normocephalic and atraumatic.  Eyes: Conjunctivae and EOM are normal. No scleral icterus.  Neck: Neck supple. No thyromegaly present.  Cardiovascular: Normal rate and regular rhythm.  Exam reveals  no gallop and no friction rub.   No murmur heard. Pulmonary/Chest: No stridor. He has no wheezes. He has no rales. He exhibits no tenderness.  Abdominal: He exhibits no distension. There is tenderness. There is no rebound.       Tender epigastric  Musculoskeletal: Normal range of motion. He exhibits no edema.  Lymphadenopathy:    He has no cervical adenopathy.  Neurological: He is oriented to person, place, and time. Coordination normal.  Skin: No rash noted. No erythema.  Psychiatric: He has a normal mood and affect. His behavior is normal.    ED Course   Procedures (including critical care time)  Labs Reviewed  CBC - Abnormal; Notable for the following:    WBC 15.5 (*)    RBC 4.17 (*)    HCT 36.9 (*)    MCHC 36.9 (*)    RDW 11.4 (*)    All other components within normal limits  DIFFERENTIAL - Abnormal; Notable for the following:    Neutrophils Relative 91 (*)    Neutro Abs 14.1 (*)    Lymphocytes Relative 5 (*)    All other components within normal limits  COMPREHENSIVE METABOLIC PANEL - Abnormal; Notable for the following:    Sodium 132 (*)    Potassium 3.4 (*)    Chloride 95 (*)    Glucose, Bld 107 (*)    All other components within normal limits  LIPASE, BLOOD  URINALYSIS, ROUTINE W REFLEX MICROSCOPIC  URINALYSIS, ROUTINE W REFLEX MICROSCOPIC   Ct Abdomen Pelvis W Contrast  06/04/2011  *RADIOLOGY REPORT*  Clinical Data: Mid abdominal pain, nausea and vomiting.  CT ABDOMEN AND PELVIS WITH CONTRAST  Technique:  Multidetector CT imaging of the abdomen and pelvis was performed following the standard protocol during bolus administration of intravenous contrast.  Contrast: OMNIPAQUE IOHEXOL 300 MG/ML IV SOLN  Comparison: CT scan 04/24/2010.  Findings: The lung bases are clear.  The liver demonstrates mild fatty infiltration.  No focal hepatic lesions or intrahepatic biliary dilatation.  The gallbladder is normal.  No common bile duct dilatation.  The pancreas is normal. The spleen is normal in size.  No focal lesions.  The adrenal glands are normal.  The left kidney is normal.  There are too stable right renal calculi but no obstructing ureteral calculi.  The stomach is under distended but no gross abnormalities are seen. The small bowel and colon are unremarkable.  No inflammatory changes or mass lesions.  The appendix is normal.  No mesenteric or retroperitoneal masses or lymphadenopathy.  The aorta is normal in caliber.  The major branch vessels are patent.  The bladder is mildly distended but otherwise normal.  No bladder calculi.   The prostate gland and seminal vesicles are normal. There is a small amount of free pelvic fluid which is unusual for a male but no obvious cause is identified.  No pelvic mass or adenopathy.  No inguinal mass or hernia.  The bony structures are unremarkable.  IMPRESSION:  1.  Stable right renal calculi.  No obstructing ureteral calculi. 2.  Small amount of free pelvic fluid of uncertain significance or etiology. 3.  Normal appendix.  Original Report Authenticated By: P. Loralie Champagne, M.D.     1. Vomiting       MDM          Benny Lennert, MD 06/04/11 [phone]

## 2011-06-04 NOTE — ED Notes (Signed)
Pt has been vomiting for three days, has been seen here for the same, no relief after pt gets home

## 2011-06-05 LAB — BASIC METABOLIC PANEL
CO2: 25 mEq/L (ref 19–32)
Chloride: 98 mEq/L (ref 96–112)
GFR calc Af Amer: >90 mL/min (ref >90)
Potassium: 3.7 mEq/L (ref 3.5–5.1)
Sodium: 129 mEq/L — ABNORMAL LOW (ref 135–145)

## 2011-06-05 LAB — CBC
HCT: 32.6 % — ABNORMAL LOW (ref 39.0–52.0)
Hemoglobin: 12 g/dL — ABNORMAL LOW (ref 13.0–17.0)
MCV: 88.6 fL (ref 78.0–100.0)
RBC: 3.68 MIL/uL — ABNORMAL LOW (ref 4.22–5.81)
WBC: 9.4 10*3/uL (ref 4.0–10.5)

## 2011-06-05 NOTE — Progress Notes (Signed)
PATIENT DETAILS Name: Erik Cowan Age: 26 y.o. Sex: male Date of Birth: September 09, 1985 Admit Date: 06/04/2011 ZOX:WRUEAV Arlyce Dice, MD, MD, Clementeen Graham POA:   CONSULTS: None  Interval History: Erik Cowan is an 26 y.o. male with a PMH of cyclic vomiting syndrome who has been evaluated in the ER 3 times in the past 4 days for abdominal pain and vomiting. He was admitted on 06/04/11 with complaints of intractable nausea and vomiting.    ROS: Erik Cowan has not had any N/V over night or today.  No diarrhea.  No abdominal pain.    Objective: Vital signs in last 24 hours: Temp:  [96.4 F (35.8 C)-98.6 F (37 C)] 98.3 F (36.8 C) (01/05 1340) Pulse Rate:  [46-60] 60  (01/05 1340) Resp:  [16-18] 18  (01/05 1340) BP: (121-147)/(72-86) 131/80 mmHg (01/05 1340) SpO2:  [100 %] 100 % (01/05 1340) Weight:  [70.8 kg (156 lb 1.4 oz)] 156 lb 1.4 oz (70.8 kg) (01/04 1822) Weight change:  Last BM Date: 05/28/11  Intake/Output from previous day:  Intake/Output Summary (Last 24 hours) at 06/05/11 1702 Last data filed at 06/05/11 1340  Gross per 24 hour  Intake   2387 ml  Output   2775 ml  Net   -388 ml     Physical Exam:  Gen:  NAD Cardiovascular:  RRR, No M/R/G Respiratory: Lungs CTAB Gastrointestinal: Abdomen soft, NT/ND with normal active bowel sounds. Extremities: No C/E/C     Lab Results: Basic Metabolic Panel:  Lab 06/05/11 4098 06/04/11 1000 06/03/11 1000 06/01/11 1915  NA 129* 132* 131* 136  K 3.7 3.4* -- --  CL 98 95* 95* 100  CO2 25 24 25 24   GLUCOSE 88 107* 115* 133*  BUN 7 11 10 13   CREATININE 0.95 0.86 0.98 0.83  CALCIUM 9.0 9.6 9.5 10.0  MG -- -- -- --  PHOS -- -- -- --   GFR Estimated Creatinine Clearance: 119 ml/min (by C-G formula based on Cr of 0.95). Liver Function Tests:  Lab 06/04/11 1000 06/01/11 1915  AST 19 19  ALT 14 16  ALKPHOS 74 75  BILITOT 0.8 0.4  PROT 8.0 8.0  ALBUMIN 4.6 4.8    Lab 06/04/11 1000 06/01/11 1915  LIPASE 26 12    AMYLASE -- --    CBC:  Lab 06/05/11 0421 06/04/11 1000 06/03/11 1000 06/01/11 1915  WBC 9.4 15.5* 6.0 8.0  NEUTROABS -- 14.1* -- 7.2  HGB 12.0* 13.6 12.7* 12.8*  HCT 32.6* 36.9* 35.0* 34.9*  MCV 88.6 88.5 90.0 90.6  PLT 189 194 165 141*    Studies/Results: Ct Abdomen Pelvis W Contrast  06/04/2011  *RADIOLOGY REPORT*  Clinical Data: Mid abdominal pain, nausea and vomiting.  CT ABDOMEN AND PELVIS WITH CONTRAST  Technique:  Multidetector CT imaging of the abdomen and pelvis was performed following the standard protocol during bolus administration of intravenous contrast.  Contrast: OMNIPAQUE IOHEXOL 300 MG/ML IV SOLN  Comparison: CT scan 04/24/2010.  Findings: The lung bases are clear.  The liver demonstrates mild fatty infiltration.  No focal hepatic lesions or intrahepatic biliary dilatation.  The gallbladder is normal.  No common bile duct dilatation.  The pancreas is normal. The spleen is normal in size.  No focal lesions.  The adrenal glands are normal.  The left kidney is normal.  There are too stable right renal calculi but no obstructing ureteral calculi.  The stomach is under distended but no gross abnormalities are seen. The small bowel and  colon are unremarkable.  No inflammatory changes or mass lesions.  The appendix is normal.  No mesenteric or retroperitoneal masses or lymphadenopathy.  The aorta is normal in caliber.  The major branch vessels are patent.  The bladder is mildly distended but otherwise normal.  No bladder calculi.  The prostate gland and seminal vesicles are normal. There is a small amount of free pelvic fluid which is unusual for a male but no obvious cause is identified.  No pelvic mass or adenopathy.  No inguinal mass or hernia.  The bony structures are unremarkable.  IMPRESSION:  1.  Stable right renal calculi.  No obstructing ureteral calculi. 2.  Small amount of free pelvic fluid of uncertain significance or etiology. 3.  Normal appendix.  Original Report  Authenticated By: P. Loralie Champagne, M.D.    Medications: Scheduled Meds:   . diphenhydrAMINE  25 mg Intravenous Q6H  . influenza  inactive virus vaccine  0.5 mL Intramuscular Tomorrow-1000  . metoCLOPramide (REGLAN) injection  5 mg Intravenous Q6H  . pantoprazole (PROTONIX) IV  40 mg Intravenous Q24H  . DISCONTD: sodium chloride   Intravenous STAT   Continuous Infusions:   . 0.9 % NaCl with KCl 40 mEq / L 75 mL/hr at 06/05/11 0629   PRN Meds:.acetaminophen, acetaminophen, morphine, ondansetron (ZOFRAN) IV, ondansetron, oxyCODONE Antibiotics: Anti-infectives    None       Assessment/Plan: Principal Problem:  *Cyclic vomiting syndrome/Abdominal pain, epigastric  The patient has had an extensive evaluation by GI regarding this. He is s/p EGD, GES, as well as multiple CT scans that have all been unrevealing. Will are ruling out intestinal angioedema with C1 esterase studies and serum tryptase. Treating symptomatically. Advance diet to regular and monitor. Active Problems:  GERD (gastroesophageal reflux disease)  Will place on IV PPI therapy.  Nephrolithiasis  Non-obstructing, and not likely the source of his abdominal pain or vomiting.  Hyponatremia  Likely secondary to dehydration. Start NS infusion.  Hypokalemia  Likely secondary to GI losses. Replete IV.  Leukocytosis  May be secondary to UTI. Mild bactiuria present on U/A. F/U  urine culture.     LOS: 1 day   Hillery Aldo, MD Pager [phone]  06/05/2011, 5:02 PM

## 2011-06-06 LAB — URINE CULTURE
Colony Count: NO GROWTH
Culture  Setup Time: 201301050250
Culture: NO GROWTH

## 2011-06-06 MED ORDER — BOOST / RESOURCE BREEZE PO LIQD
1.0000 | Freq: Three times a day (TID) | ORAL | Status: DC
  Administered 2011-06-06: 1 via ORAL

## 2011-06-06 MED ORDER — MORPHINE SULFATE 2 MG/ML IJ SOLN
4.0000 mg | INTRAMUSCULAR | Status: DC | PRN
  Administered 2011-06-06 – 2011-06-07 (×3): 4 mg via INTRAVENOUS
  Filled 2011-06-06 (×3): qty 2

## 2011-06-06 MED ORDER — MORPHINE SULFATE 2 MG/ML IJ SOLN
2.0000 mg | Freq: Once | INTRAMUSCULAR | Status: AC
  Administered 2011-06-06: 2 mg via INTRAVENOUS

## 2011-06-06 MED ORDER — METOCLOPRAMIDE HCL 5 MG/ML IJ SOLN
10.0000 mg | Freq: Four times a day (QID) | INTRAMUSCULAR | Status: DC
  Administered 2011-06-06 – 2011-06-07 (×5): 10 mg via INTRAVENOUS
  Filled 2011-06-06 (×4): qty 2

## 2011-06-06 MED ORDER — PROMETHAZINE HCL 25 MG/ML IJ SOLN
12.5000 mg | Freq: Four times a day (QID) | INTRAMUSCULAR | Status: DC | PRN
  Administered 2011-06-06: 21:00:00 via INTRAVENOUS
  Administered 2011-06-07: 12.5 mg via INTRAVENOUS
  Filled 2011-06-06 (×2): qty 1

## 2011-06-06 NOTE — Progress Notes (Signed)
PATIENT DETAILS Name: Erik Cowan Age: 26 y.o. Sex: male Date of Birth: 1986-01-29 Admit Date: 06/04/2011 ZOX:WRUEAV Arlyce Dice, MD, MD, Clementeen Graham POA:   CONSULTS: None  Interval History: Erik Cowan is an 26 y.o. male with a PMH of cyclic vomiting syndrome who has been evaluated in the ER 3 times in the past 4 days for abdominal pain and vomiting. He was admitted on 06/04/11 with complaints of intractable nausea and vomiting.    ROS: Erik Cowan has had recurrent N/V today.  He is having abdominal pain, and is curled up in the fetal position during my exam.     Objective: Vital signs in last 24 hours: Temp:  [97.8 F (36.6 C)-98.3 F (36.8 C)] 97.8 F (36.6 C) (01/06 0506) Pulse Rate:  [58-65] 65  (01/06 0506) Resp:  [18] 18  (01/06 0506) BP: (128-131)/(75-80) 128/76 mmHg (01/06 0506) SpO2:  [99 %-100 %] 99 % (01/06 0506) Weight change:  Last BM Date: 05/28/11  Intake/Output from previous day:  Intake/Output Summary (Last 24 hours) at 06/06/11 1046 Last data filed at 06/06/11 0800  Gross per 24 hour  Intake   2451 ml  Output   3325 ml  Net   -874 ml     Physical Exam:  Gen:  NAD Cardiovascular:  RRR, No M/R/G Respiratory: Lungs CTAB Gastrointestinal: Abdomen soft, NT/ND with normal active bowel sounds. Extremities: No C/E/C     Lab Results: Basic Metabolic Panel:  Lab 06/05/11 4098 06/04/11 1000 06/03/11 1000 06/01/11 1915  NA 129* 132* 131* 136  K 3.7 3.4* -- --  CL 98 95* 95* 100  CO2 25 24 25 24   GLUCOSE 88 107* 115* 133*  BUN 7 11 10 13   CREATININE 0.95 0.86 0.98 0.83  CALCIUM 9.0 9.6 9.5 10.0  MG -- -- -- --  PHOS -- -- -- --   GFR Estimated Creatinine Clearance: 119 ml/min (by C-G formula based on Cr of 0.95). Liver Function Tests:  Lab 06/04/11 1000 06/01/11 1915  AST 19 19  ALT 14 16  ALKPHOS 74 75  BILITOT 0.8 0.4  PROT 8.0 8.0  ALBUMIN 4.6 4.8    Lab 06/04/11 1000 06/01/11 1915  LIPASE 26 12  AMYLASE -- --    CBC:  Lab  06/05/11 0421 06/04/11 1000 06/03/11 1000 06/01/11 1915  WBC 9.4 15.5* 6.0 8.0  NEUTROABS -- 14.1* -- 7.2  HGB 12.0* 13.6 12.7* 12.8*  HCT 32.6* 36.9* 35.0* 34.9*  MCV 88.6 88.5 90.0 90.6  PLT 189 194 165 141*   Microbiology Urine culture: Specimen Description URINE, Colony Count NO GROWTH Culture NO GROWTH Report Status 06/06/2011 FINAL   Studies/Results: Ct Abdomen Pelvis W Contrast  06/04/2011  *RADIOLOGY REPORT*  Clinical Data: Mid abdominal pain, nausea and vomiting.  CT ABDOMEN AND PELVIS WITH CONTRAST  Technique:  Multidetector CT imaging of the abdomen and pelvis was performed following the standard protocol during bolus administration of intravenous contrast.  Contrast: OMNIPAQUE IOHEXOL 300 MG/ML IV SOLN  Comparison: CT scan 04/24/2010.  Findings: The lung bases are clear.  The liver demonstrates mild fatty infiltration.  No focal hepatic lesions or intrahepatic biliary dilatation.  The gallbladder is normal.  No common bile duct dilatation.  The pancreas is normal. The spleen is normal in size.  No focal lesions.  The adrenal glands are normal.  The left kidney is normal.  There are too stable right renal calculi but no obstructing ureteral calculi.  The stomach is under distended but  no gross abnormalities are seen. The small bowel and colon are unremarkable.  No inflammatory changes or mass lesions.  The appendix is normal.  No mesenteric or retroperitoneal masses or lymphadenopathy.  The aorta is normal in caliber.  The major branch vessels are patent.  The bladder is mildly distended but otherwise normal.  No bladder calculi.  The prostate gland and seminal vesicles are normal. There is a small amount of free pelvic fluid which is unusual for a male but no obvious cause is identified.  No pelvic mass or adenopathy.  No inguinal mass or hernia.  The bony structures are unremarkable.  IMPRESSION:  1.  Stable right renal calculi.  No obstructing ureteral calculi. 2.  Small amount of  free pelvic fluid of uncertain significance or etiology. 3.  Normal appendix.  Original Report Authenticated By: P. Loralie Champagne, M.D.    Medications: Scheduled Meds:    . diphenhydrAMINE  25 mg Intravenous Q6H  . metoCLOPramide (REGLAN) injection  5 mg Intravenous Q6H  . pantoprazole (PROTONIX) IV  40 mg Intravenous Q24H   Continuous Infusions:    . 0.9 % NaCl with KCl 40 mEq / L 75 mL/hr (06/05/11 1939)   PRN Meds:.acetaminophen, acetaminophen, morphine, ondansetron (ZOFRAN) IV, ondansetron, oxyCODONE Antibiotics: Anti-infectives    None       Assessment/Plan: Principal Problem:  *Cyclic vomiting syndrome/Abdominal pain, epigastric  The patient has had an extensive evaluation by GI regarding this. He is s/p EGD, GES, as well as multiple CT scans that have all been unrevealing. We are ruling out intestinal angioedema with C1 esterase studies and serum tryptase. Treating symptomatically. Advanced diet to regular on 06/06/11, but did not tolerate.  ? Cannabinoid hyperemesis syndrome, UDS done x 3 in past 1.5 years all were positive for THC.  He admits to use, so no need to re-order a UDS.  Last use was 1 week ago.  Active Problems:  GERD (gastroesophageal reflux disease)  Being maintained on IV PPI therapy.  Nephrolithiasis  Non-obstructing, and not likely the source of his abdominal pain or vomiting.  Hyponatremia  Likely secondary to dehydration. We started a  NS infusion on 06/05/11.  Hypokalemia  Likely secondary to GI losses. Repleted.  Leukocytosis  Thought to possibly be secondary to UTI. Mild bactiuria present on U/A.  Urine culture negative.  WBC normalized.     LOS: 2 days   Hillery Aldo, MD Pager [phone]  06/06/2011, 10:46 AM

## 2011-06-06 NOTE — Progress Notes (Signed)
INITIAL ADULT NUTRITION ASSESSMENT Date: 06/06/2011   Time: 10:15 AM Reason for Assessment: Consult for Dysphagia  ASSESSMENT: Male 26 y.o.  Patient continues to vomit. RN stated patient throws up everything he consumes. Patient did well with clear liquids.   Dx: Cyclic vomiting syndrome  Hx:  Past Medical History  Diagnosis Date  . GERD (gastroesophageal reflux disease)   . Nephrolithiasis   . Cyclic vomiting syndrome    Related Meds:  Scheduled Meds:   . diphenhydrAMINE  25 mg Intravenous Q6H  . metoCLOPramide (REGLAN) injection  5 mg Intravenous Q6H  . pantoprazole (PROTONIX) IV  40 mg Intravenous Q24H   Continuous Infusions:   . 0.9 % NaCl with KCl 40 mEq / L 75 mL/hr (06/05/11 1939)   PRN Meds:.acetaminophen, acetaminophen, morphine, ondansetron (ZOFRAN) IV, ondansetron, oxyCODONE  Ht: 6\' 2"  (188 cm)  Wt: 156 lb 1.4 oz (70.8 kg)  Ideal Wt: 86.3 kg % Ideal Wt: 82%  Body mass index is 20.04 kg/(m^2).  Food/Nutrition Related Hx: unknown  Labs:  CMP     Component Value Date/Time   NA 129* 06/05/2011 0421   K 3.7 06/05/2011 0421   CL 98 06/05/2011 0421   CO2 25 06/05/2011 0421   GLUCOSE 88 06/05/2011 0421   BUN 7 06/05/2011 0421   CREATININE 0.95 06/05/2011 0421   CALCIUM 9.0 06/05/2011 0421   PROT 8.0 06/04/2011 1000   ALBUMIN 4.6 06/04/2011 1000   AST 19 06/04/2011 1000   ALT 14 06/04/2011 1000   ALKPHOS 74 06/04/2011 1000   BILITOT 0.8 06/04/2011 1000   GFRNONAA >90 06/05/2011 0421   GFRAA >90 06/05/2011 0421    Intake/Output Summary (Last 24 hours) at 06/06/11 1017 Last data filed at 06/06/11 0800  Gross per 24 hour  Intake   2451 ml  Output   3325 ml  Net   -874 ml    Diet Order: General  Supplements/Tube Feeding: none at this time  IVF:    0.9 % NaCl with KCl 40 mEq / L Last Rate: 75 mL/hr (06/05/11 1939)    Estimated Nutritional Needs:   Kcal: 1750-2100 kcal Protein: 85-99 g protein Fluid: 1 ml per kcal  NUTRITION DIAGNOSIS: -Inadequate oral intake  (NI-2.1).  Status: Ongoing -Increased nutrient need Status: Ongoing  RELATED TO: cyclic vomiting  AS EVIDENCE BY: patient inability to keep food down  MONITORING/EVALUATION(Goals): PO intake, labs, weight status, tolerance of solid food 1. Promote weight maintenance/ prevent weight loss  (as able with current condition)  EDUCATION NEEDS: -No education needs identified at this time  INTERVENTION: 1. Provide resource breeze TID. Provides 750 kcal and 27 grams protein daily   Dietitian #:[id number]  DOCUMENTATION CODES Per approved criteria  -Not Applicable    Iven Finn [facility] 06/06/2011, 10:15 AM

## 2011-06-07 LAB — TRYPTASE: Tryptase: 3.4 ug/L (ref <11)

## 2011-06-07 LAB — COMPREHENSIVE METABOLIC PANEL
ALT: 10 U/L (ref 0–53)
AST: 12 U/L (ref 0–37)
Alkaline Phosphatase: 69 U/L (ref 39–117)
CO2: 27 mEq/L (ref 19–32)
Calcium: 9.4 mg/dL (ref 8.4–10.5)
GFR calc Af Amer: >90 mL/min (ref >90)
GFR calc non Af Amer: >90 mL/min (ref >90)
Glucose, Bld: 100 mg/dL — ABNORMAL HIGH (ref 70–99)
Potassium: 3.9 mEq/L (ref 3.5–5.1)
Sodium: 131 mEq/L — ABNORMAL LOW (ref 135–145)
Total Protein: 6.9 g/dL (ref 6.0–8.3)

## 2011-06-07 LAB — CBC
HCT: 31.8 % — ABNORMAL LOW (ref 39.0–52.0)
Hemoglobin: 11.7 g/dL — ABNORMAL LOW (ref 13.0–17.0)
MCH: 33.1 pg (ref 26.0–34.0)
MCHC: 36.8 g/dL — ABNORMAL HIGH (ref 30.0–36.0)
RDW: 11.7 % (ref 11.5–15.5)

## 2011-06-07 MED ORDER — PROMETHAZINE HCL 50 MG RE SUPP: 50.0000 mg | Freq: Four times a day (QID) | RECTAL | Status: DC | PRN

## 2011-06-07 NOTE — Progress Notes (Signed)
Talked to patient about follow up medical care, no pcp; [facility] is agreeable [facility] Eligibility apt Jun 30, 2011 at 11:30 Apt with Dr Andrey Campanile Jul 29, 2011 at 11am Information given to the patient; Nicole Cella with [facility] management in talking to patient also; B. Lobbyist, BSN, [place]

## 2011-06-07 NOTE — Progress Notes (Signed)
Patient given discharge instructions, verbalized understanding of instructions and where to pick up prescriptions. Iv site removed, catheter tip intact. Patient tolerating meal. Pain level controlled. Will leave unit in wheelchair accompanied by staff.

## 2011-06-07 NOTE — Discharge Summary (Signed)
Physician Discharge Summary  Patient ID: Erik Cowan MRN: [id number] DOB/AGE: 1985-11-06 26 y.o.  Admit date: 06/04/2011 Discharge date: 06/07/2011  Primary Care Physician: None Gastroenterologist:  Erik Heaps, MD, MD, [facility]   Discharge Diagnoses:    Present on Admission:  .Abdominal pain, epigastric .GERD (gastroesophageal reflux disease) .Nephrolithiasis .Cyclic vomiting syndrome R/O cannabis hyperemesis syndrome and intestinal angioedema .Hyponatremia .Hypokalemia .Leukocytosis  Discharge Medications:  Current Discharge Medication List    START taking these medications   Details  promethazine (PROMETHEGAN) 50 MG suppository Place 1 suppository (50 mg total) rectally every 6 (six) hours as needed for nausea. Qty: 12 each, Refills: 2      CONTINUE these medications which have NOT CHANGED   Details  omeprazole (PRILOSEC OTC) 20 MG tablet Take 20 mg by mouth daily as needed. heartburn      STOP taking these medications     HYDROcodone-acetaminophen (NORCO) 5-325 MG per tablet      HYDROmorphone (DILAUDID) 2 MG tablet      ondansetron (ZOFRAN ODT) 8 MG disintegrating tablet      oxyCODONE-acetaminophen (PERCOCET) 5-325 MG per tablet      promethazine (PHENERGAN) 25 MG tablet          Disposition and Follow-up: The patient is being discharged home.  He is instructed to follow up with Erik Cowan in 1 week. The care manager is working with the patient to arrange for follow up with a PCP.  Consults: None  Significant Diagnostic Studies:  Ct Abdomen Pelvis W Contrast  06/04/2011  IMPRESSION:  1.  Stable right renal calculi.  No obstructing ureteral calculi. 2.  Small amount of free pelvic fluid of uncertain significance or etiology. 3.  Normal appendix.  Original Report Authenticated By: Erik Cowan, M.D.   Dg Abd Acute W/chest  06/01/2011  IMPRESSION:  1. No evidence for acute cardiopulmonary abnormality. 2.  nonobstructive bowel gas pattern. 3.   Probable right intrarenal calculus, 4 mm in diameter.  Original Report Authenticated By: Erik Cowan, M.D.    Discharge Laboratory Values: Basic Metabolic Panel:  Lab 06/07/11 4098 06/05/11 0421 06/04/11 1000 06/03/11 1000 06/01/11 1915  NA 131* 129* 132* 131* 136  K 3.9 3.7 -- -- --  CL 97 98 95* 95* 100  CO2 27 25 24 25 24   GLUCOSE 100* 88 107* 115* 133*  BUN 9 7 11 10 13   CREATININE 1.06 0.95 0.86 0.98 0.83  CALCIUM 9.4 9.0 9.6 9.5 10.0  MG -- -- -- -- --  PHOS -- -- -- -- --   GFR Estimated Creatinine Clearance: 106.7 ml/min (by C-G formula based on Cr of 1.06). Liver Function Tests:  Lab 06/07/11 0443 06/04/11 1000 06/01/11 1915  AST 12 19 19   ALT 10 14 16   ALKPHOS 69 74 75  BILITOT 0.8 0.8 0.4  PROT 6.9 8.0 8.0  ALBUMIN 3.8 4.6 4.8    Lab 06/04/11 1000 06/01/11 1915  LIPASE 26 12  AMYLASE -- --    CBC:  Lab 06/07/11 0443 06/05/11 0421 06/04/11 1000 06/03/11 1000 06/01/11 1915  WBC 8.2 9.4 15.5* 6.0 8.0  NEUTROABS -- -- 14.1* -- 7.2  HGB 11.7* 12.0* 13.6 12.7* 12.8*  HCT 31.8* 32.6* 36.9* 35.0* 34.9*  MCV 90.1 88.6 88.5 90.0 90.6  PLT 200 189 194 165 141*    Ref. Range 06/05/2011 05:00  Tryptase Latest Range: <11 ug/L 3.4   Microbiology Recent Results (from the past 240 hour(s))  URINE CULTURE  Status: Normal   Collection Time   06/04/11  2:29 PM      Component Value Range Status Comment   Specimen Description URINE, CLEAN CATCH   Final    Special Requests NONE   Final    Setup Time [id number]   Final    Colony Count NO GROWTH   Final    Culture NO GROWTH   Final    Report Status 06/06/2011 FINAL   Final      Brief H and P: For complete details please refer to admission H and P, but in brief, Erik Cowan is an 26 y.o. male with a PMH of cyclic vomiting syndrome who had been evaluated in the ER 3 times in the past 4 days for abdominal pain and vomiting. He was admitted on 06/04/11 with complaints of intractable nausea and vomiting.       Physical Exam at Discharge: BP 117/67  Pulse 61  Temp(Src) 97.5 F (36.4 C) (Oral)  Resp 18  Ht 6\' 2"  (1.88 m)  Wt 70.8 kg (156 lb 1.4 oz)  BMI 20.04 kg/m2  SpO2 98% Gen:  NAD Cardiovascular:  RRR, No M/R/G Respiratory: Lungs CTAB Gastrointestinal: Abdomen soft, NT/ND with normal active bowel sounds. Extremities: No C/E/C      Hospital Course:  Principal Problem:  *Cyclic vomiting syndrome/Abdominal pain, epigastric R/O cannabis hyperemesis syndrome / intestinal angioedema The patient has had an extensive evaluation by GI regarding this. He is s/p EGD, GES, as well as multiple CT scans that have all been unrevealing. We are ruling out intestinal angioedema with C1 esterase studies (pending) and tryptase (WNL). Treating symptomatically. Advanced diet to regular on 06/06/11, but did not tolerate. ? Cannabinoid hyperemesis syndrome, UDS done x 3 in past 1.5 years all were positive for THC. He admits to use, so no need to re-order a UDS. Last use was 1 week ago. I have given him educational materials about cannabinoid hyperemesis syndrome and advised him to discontinue marijuana use. Active Problems:  GERD (gastroesophageal reflux disease)  The patient was maintained on IV PPI therapy.  Nephrolithiasis  Non-obstructing, and not likely the source of his abdominal pain or vomiting.  Hyponatremia  Likely secondary to dehydration. We started a NS infusion on 06/05/11 with improvement in his sodium values.  Hypokalemia  Likely secondary to GI losses. Repleted.  Leukocytosis  Thought to possibly be secondary to UTI. Mild bactiuria present on U/A. Urine culture negative. WBC normalized on 06/05/11.       Recommendations for hospital follow-up: 1.  F/U C1 Esterase studies which are currently pending but in progress.  Diet:  As tolerated  Activity:  No restrictions, increase activity slowly  Condition at Discharge:  Improved   Time spent on Discharge:  35  minutes  Signed: Dr. Trula Ore Erik Cowan Pager [phone] 06/07/2011, 3:21 PM

## 2012-02-23 ENCOUNTER — Encounter (HOSPITAL_COMMUNITY): Payer: Self-pay | Admitting: Emergency Medicine

## 2012-02-23 ENCOUNTER — Emergency Department (HOSPITAL_COMMUNITY)
Admission: EM | Admit: 2012-02-23 | Discharge: 2012-02-23 | Disposition: A | Discharge status: Home / Self Care | Payer: Self-pay | Attending: Emergency Medicine | Admitting: Emergency Medicine

## 2012-02-23 DIAGNOSIS — K219 Gastro-esophageal reflux disease without esophagitis: Secondary | ICD-10-CM | POA: Insufficient documentation

## 2012-02-23 DIAGNOSIS — Z87442 Personal history of urinary calculi: Secondary | ICD-10-CM | POA: Insufficient documentation

## 2012-02-23 DIAGNOSIS — Z800 Family history of malignant neoplasm of digestive organs: Secondary | ICD-10-CM | POA: Insufficient documentation

## 2012-02-23 DIAGNOSIS — R109 Unspecified abdominal pain: Secondary | ICD-10-CM | POA: Insufficient documentation

## 2012-02-23 DIAGNOSIS — Z833 Family history of diabetes mellitus: Secondary | ICD-10-CM | POA: Insufficient documentation

## 2012-02-23 DIAGNOSIS — Z8249 Family history of ischemic heart disease and other diseases of the circulatory system: Secondary | ICD-10-CM | POA: Insufficient documentation

## 2012-02-23 DIAGNOSIS — R112 Nausea with vomiting, unspecified: Secondary | ICD-10-CM | POA: Insufficient documentation

## 2012-02-23 DIAGNOSIS — Z8043 Family history of malignant neoplasm of testis: Secondary | ICD-10-CM | POA: Insufficient documentation

## 2012-02-23 LAB — CBC WITH DIFFERENTIAL/PLATELET
Basophils Absolute: 0 10*3/uL (ref 0.0–0.1)
Basophils Relative: 0 % (ref 0–1)
Eosinophils Absolute: 0 10*3/uL (ref 0.0–0.7)
Eosinophils Relative: 0 % (ref 0–5)
MCH: 33.7 pg (ref 26.0–34.0)
MCV: 92.5 fL (ref 78.0–100.0)
Neutrophils Relative %: 85 % — ABNORMAL HIGH (ref 43–77)
Platelets: 126 10*3/uL — ABNORMAL LOW (ref 150–400)
RBC: 3.86 MIL/uL — ABNORMAL LOW (ref 4.22–5.81)
RDW: 11.8 % (ref 11.5–15.5)

## 2012-02-23 LAB — COMPREHENSIVE METABOLIC PANEL
ALT: 18 U/L (ref 0–53)
AST: 22 U/L (ref 0–37)
Albumin: 4.4 g/dL (ref 3.5–5.2)
Alkaline Phosphatase: 90 U/L (ref 39–117)
Calcium: 9.5 mg/dL (ref 8.4–10.5)
GFR calc Af Amer: >90 mL/min (ref >90)
Potassium: 3.5 mEq/L (ref 3.5–5.1)
Sodium: 134 mEq/L — ABNORMAL LOW (ref 135–145)
Total Protein: 7.3 g/dL (ref 6.0–8.3)

## 2012-02-23 MED ORDER — SODIUM CHLORIDE 0.9 % IV BOLUS (SEPSIS)
1000.0000 mL | Freq: Once | INTRAVENOUS | Status: AC
  Administered 2012-02-23: 1000 mL via INTRAVENOUS

## 2012-02-23 MED ORDER — ONDANSETRON HCL 4 MG/2ML IJ SOLN
4.0000 mg | Freq: Once | INTRAMUSCULAR | Status: AC
  Administered 2012-02-23: 4 mg via INTRAVENOUS
  Filled 2012-02-23: qty 2

## 2012-02-23 MED ORDER — MORPHINE SULFATE 4 MG/ML IJ SOLN
4.0000 mg | Freq: Once | INTRAMUSCULAR | Status: AC
  Administered 2012-02-23: 4 mg via INTRAVENOUS
  Filled 2012-02-23: qty 1

## 2012-02-23 MED ORDER — PROMETHAZINE HCL 25 MG PO TABS: 25.0000 mg | ORAL_TABLET | Freq: Four times a day (QID) | ORAL | Status: DC | PRN

## 2012-02-23 MED ORDER — HYDROCODONE-ACETAMINOPHEN 5-325 MG PO TABS: 1.0000 | ORAL_TABLET | ORAL | Status: DC | PRN

## 2012-02-23 NOTE — ED Notes (Signed)
Pt reporting n/v/d x 3 days. Has epigastric pain x 3 days. Unable to eat or drink due to n/v. Pt warm to touch, pt resting, lying in fetal position. C/o 9/10 epigastric pain. Pt is a x 4. Has family at bedside

## 2012-02-23 NOTE — ED Provider Notes (Signed)
Medical screening examination/treatment/procedure(s) were performed by non-physician practitioner and as supervising physician I was immediately available for consultation/collaboration.   Lyanne Co, MD 02/23/12 1159

## 2012-02-23 NOTE — ED Notes (Signed)
All over abd pain since Monday n/v/d

## 2012-02-23 NOTE — ED Provider Notes (Signed)
History     CSN: [id number]  Arrival date & time 02/23/12  7829   First MD Initiated Contact with Patient 02/23/12 [phone]      Chief Complaint  Patient presents with  . Abdominal Pain    (Consider location/radiation/quality/duration/timing/severity/associated sxs/prior treatment) HPI Comments: Patient and significant other report patient has had periumbilical pain, N/V x 3 days.  Pain "feels like I swallowed glass."  Emesis is described as green.  Pt has had one loose stool in the past three days.  Is not able to eat anything, is drinking water and Gatorade but states that "it comes right back up." Associated subjective fevers (feeling hot).  Was seen in [place] for same three days ago and was given PO zofran tabs and PR nausea medications (unknown name), states these are not helping.  Pt states he gets this 3-4 times a year and feels exactly like his previous symptoms.  Has been diagnosed with cyclic vomiting syndrome, has seen GI and had multiple procedures and tests done without results.  Per his chart, the question was raised as to whether this could be cannabis hyperemesis syndrome.  Pt does admit to continued marijuana use.  Pt is able to pass flatus.  Significant other does note that he ate sushi the night before the symptoms began, no other abnormal foods.  Denies bloody stool, melena, urinary symptoms, testicular pain or swelling.  No hx of any abdominal surgeries.    The history is provided by the patient and a significant other.    Past Medical History  Diagnosis Date  . GERD (gastroesophageal reflux disease)   . Nephrolithiasis   . Cyclic vomiting syndrome     Past Surgical History  Procedure Date  . Unremarkable     Family History  Problem Relation Age of Onset  . Colon cancer Maternal Uncle   . Liver cancer Mother   . Diabetes Maternal Aunt   . Heart disease Paternal Grandfather   . Testicular cancer Brother     History  Substance Use Topics  . Smoking status:  Former Smoker -- 1 years    Types: Cigarettes    Quit date: 06/04/2007  . Smokeless tobacco: Never Used   Comment: see illicit drug comment  . Alcohol Use: Yes     occasional-once per month      Review of Systems  Constitutional: Positive for fever.  Respiratory: Negative for shortness of breath.   Cardiovascular: Negative for chest pain.  Gastrointestinal: Positive for nausea, vomiting and abdominal pain. Negative for diarrhea, constipation and blood in stool.  Genitourinary: Negative for dysuria, urgency, frequency, hematuria and testicular pain.    Allergies  Review of patient's allergies indicates no known allergies.  Home Medications   Current Outpatient Rx  Name Route Sig Dispense Refill  . ONDANSETRON HCL 4 MG PO TABS Oral Take 4 mg by mouth every 8 (eight) hours as needed. For nausea    . PROMETHAZINE HCL 25 MG RE SUPP Rectal Place 25 mg rectally every 6 (six) hours as needed. For nausea    . PROMETHAZINE HCL 50 MG RE SUPP Rectal Place 1 suppository (50 mg total) rectally every 6 (six) hours as needed for nausea. 12 each 2    BP 116/83  Pulse 76  Temp 99.2 F (37.3 C)  Resp 16  SpO2 99%  Physical Exam  Nursing note and vitals reviewed. Constitutional: He appears well-developed and well-nourished. No distress.  HENT:  Head: Normocephalic and atraumatic.  Neck: Neck supple.  Cardiovascular: Normal rate and regular rhythm.   Pulmonary/Chest: Effort normal and breath sounds normal. No respiratory distress. He has no wheezes. He has no rales.  Abdominal: Soft. Bowel sounds are normal. He exhibits no distension and no mass. There is tenderness in the right upper quadrant and epigastric area. There is no rebound and no guarding.  Neurological: He is alert. He exhibits normal muscle tone.  Skin: He is not diaphoretic.    ED Course  Procedures (including critical care time)  Labs Reviewed  CBC WITH DIFFERENTIAL - Abnormal; Notable for the following:    RBC 3.86  (*)     HCT 35.7 (*)     MCHC 36.4 (*)     Platelets 126 (*)     Neutrophils Relative 85 (*)     Lymphocytes Relative 9 (*)     Lymphs Abs 0.6 (*)     All other components within normal limits  COMPREHENSIVE METABOLIC PANEL - Abnormal; Notable for the following:    Sodium 134 (*)     Glucose, Bld 122 (*)     All other components within normal limits  LIPASE, BLOOD   No results found.  11:19 AM Patient reports improvement of pain and nausea.  Is no longer vomiting.  Reexamination of abdomen is benign.  Abdomen is soft, nondistended, diffusely tender across upper abdomen, no guarding, no rebound.  Plan for PO trial, d/c home with PO phenergan if continues to improve.    11:46 AM Pt now tolerating PO fluids, has finished entire cup of fluid.  States he only has mild nausea now.  Will redose zofran in ED and d/c home with PO phenergan as planned.    1. Abdominal pain   2. Nausea and vomiting       MDM  Pt with hx recurrent abdominal pain, N/V, with prior workup for same.  Pt continues to smoke marijuana, which is thought to be the cause of his symptoms.  Pt's abdominal exam remained benign throughout ED visit.  Symptoms relieved after one dose morphine and zofran.  I did give second dose of zofran for slight residual nausea.  Pt states these symptoms are identical to all previous episodes.  Doubt acute process at present - WBC normal, mild hyperglycemia with normal electrolytes and CO2. Normal LFTs and lipase. Pt without any symptoms to suggest UTI or kidney stone.  Doubt cholecystitis or pancreatitis given normal labs, no fever, reexamination showing diffuse upper abdominal pain, improvement of symptoms, and history of same symptoms previously with negative workup (including CT, [place], endoscopy).  Pt without abdominal distension and he is able to pass flatus, no hx abdominal surgeries - doubt SBO. Doubt appendicitis. Discussed all results with patient.  Pt given return precautions.  Pt  verbalizes understanding and agrees with plan.           [place], [place] 02/23/12 1158

## 2012-02-23 NOTE — ED Notes (Signed)
Pt provided water for PO challenge

## 2012-05-16 ENCOUNTER — Emergency Department (HOSPITAL_COMMUNITY)
Admission: EM | Admit: 2012-05-16 | Discharge: 2012-05-16 | Discharge status: Against Medical Advice | Payer: Self-pay | Attending: Emergency Medicine | Admitting: Emergency Medicine

## 2012-05-16 ENCOUNTER — Encounter (HOSPITAL_COMMUNITY): Payer: Self-pay | Admitting: *Deleted

## 2012-05-16 ENCOUNTER — Emergency Department (HOSPITAL_COMMUNITY)
Admission: EM | Admit: 2012-05-16 | Discharge: 2012-05-17 | Disposition: A | Discharge status: Home / Self Care | Payer: Self-pay | Attending: Emergency Medicine | Admitting: Emergency Medicine

## 2012-05-16 DIAGNOSIS — Z8719 Personal history of other diseases of the digestive system: Secondary | ICD-10-CM | POA: Insufficient documentation

## 2012-05-16 DIAGNOSIS — Z87891 Personal history of nicotine dependence: Secondary | ICD-10-CM | POA: Insufficient documentation

## 2012-05-16 DIAGNOSIS — Z87448 Personal history of other diseases of urinary system: Secondary | ICD-10-CM | POA: Insufficient documentation

## 2012-05-16 DIAGNOSIS — R112 Nausea with vomiting, unspecified: Secondary | ICD-10-CM | POA: Insufficient documentation

## 2012-05-16 DIAGNOSIS — E860 Dehydration: Secondary | ICD-10-CM | POA: Insufficient documentation

## 2012-05-16 DIAGNOSIS — R109 Unspecified abdominal pain: Secondary | ICD-10-CM | POA: Insufficient documentation

## 2012-05-16 DIAGNOSIS — R1013 Epigastric pain: Secondary | ICD-10-CM | POA: Insufficient documentation

## 2012-05-16 LAB — CBC WITH DIFFERENTIAL/PLATELET
HCT: 41.2 % (ref 39.0–52.0)
Hemoglobin: 15 g/dL (ref 13.0–17.0)
Lymphocytes Relative: 32 % (ref 12–46)
Lymphs Abs: 2.5 10*3/uL (ref 0.7–4.0)
MCHC: 36.4 g/dL — ABNORMAL HIGH (ref 30.0–36.0)
Monocytes Absolute: 1 10*3/uL (ref 0.1–1.0)
Monocytes Relative: 13 % — ABNORMAL HIGH (ref 3–12)
Neutro Abs: 4.3 10*3/uL (ref 1.7–7.7)
RBC: 4.5 MIL/uL (ref 4.22–5.81)

## 2012-05-16 LAB — COMPREHENSIVE METABOLIC PANEL
Alkaline Phosphatase: 90 U/L (ref 39–117)
BUN: 22 mg/dL (ref 6–23)
CO2: 28 mEq/L (ref 19–32)
Chloride: 95 mEq/L — ABNORMAL LOW (ref 96–112)
Creatinine, Ser: 1.09 mg/dL (ref 0.50–1.35)
GFR calc non Af Amer: >90 mL/min (ref >90)
Glucose, Bld: 102 mg/dL — ABNORMAL HIGH (ref 70–99)
Total Bilirubin: 0.7 mg/dL (ref 0.3–1.2)

## 2012-05-16 LAB — LIPASE, BLOOD: Lipase: 34 U/L (ref 11–59)

## 2012-05-16 MED ORDER — HYDROMORPHONE HCL PF 1 MG/ML IJ SOLN
0.5000 mg | Freq: Once | INTRAMUSCULAR | Status: AC
  Administered 2012-05-17: 0.5 mg via INTRAVENOUS
  Filled 2012-05-16: qty 1

## 2012-05-16 MED ORDER — ONDANSETRON HCL 4 MG/2ML IJ SOLN
4.0000 mg | Freq: Once | INTRAMUSCULAR | Status: AC
  Administered 2012-05-17: 4 mg via INTRAVENOUS
  Filled 2012-05-16: qty 2

## 2012-05-16 MED ORDER — SODIUM CHLORIDE 0.9 % IV BOLUS (SEPSIS)
1000.0000 mL | Freq: Once | INTRAVENOUS | Status: AC
  Administered 2012-05-17: 1000 mL via INTRAVENOUS

## 2012-05-16 MED ORDER — GI COCKTAIL ~~LOC~~
30.0000 mL | Freq: Once | ORAL | Status: AC
  Administered 2012-05-17: 30 mL via ORAL
  Filled 2012-05-16: qty 30

## 2012-05-16 NOTE — ED Notes (Signed)
Pt c/o abd pain x 2 wks; vomiting on and off

## 2012-05-16 NOTE — ED Notes (Signed)
The pt has had abd pain and vomiting for almost 2 weeks.  He was seen at [facility] x2 and the last time he was seen was last pm.  He was given a rx for nausea but he did not have the money to get it filled.Marland Kitchen  He lives in [place]

## 2012-05-16 NOTE — ED Notes (Signed)
Pt states that he is unable to void at the present time. Pt given a specimen cup at while awaiting for a room.

## 2012-05-16 NOTE — ED Provider Notes (Signed)
History     CSN: [id number]  Arrival date & time 05/16/12  2317   First MD Initiated Contact with Patient 05/16/12 2343      No chief complaint on file.   (Consider location/radiation/quality/duration/timing/severity/associated sxs/prior treatment) HPI Comments: 26 year old male who presents with recurrent vomiting and abdominal pain. According to the medical record the patient has been evaluated multiple times in the past at the emergency department with tests including CT scans, ultrasounds and gastric emptying studies none of which have shown a definitive source for his symptoms. He complains of epigastric pain which has been persistent over the last 2 weeks, he feels this daily, does not seem to be made worse by eating or drinking however he has not been able to hold down much in the way of food or fluid. He has been seen at [facility] emergency department twice including last night, he did not get his prescription filled. The patient states that he lives in [place]. The patient admits to only very occasional alcohol use, no history of prior abdominal surgery, no changes in his bowel movements.  The history is provided by the patient, a relative and medical records.    Past Medical History  Diagnosis Date  . GERD (gastroesophageal reflux disease)   . Nephrolithiasis   . Cyclic vomiting syndrome     Past Surgical History  Procedure Date  . Unremarkable     Family History  Problem Relation Age of Onset  . Colon cancer Maternal Uncle   . Liver cancer Mother   . Diabetes Maternal Aunt   . Heart disease Paternal Grandfather   . Testicular cancer Brother     History  Substance Use Topics  . Smoking status: Former Smoker -- 1 years    Types: Cigarettes    Quit date: 06/04/2007  . Smokeless tobacco: Never Used     Comment: see illicit drug comment  . Alcohol Use: Yes     Comment: occasional-once per month      Review of Systems  All other systems reviewed and are  negative.    Allergies  Review of patient's allergies indicates no known allergies.  Home Medications   Current Outpatient Rx  Name  Route  Sig  Dispense  Refill  . ACETAMINOPHEN 500 MG PO TABS   Oral   Take 1,000 mg by mouth every 6 (six) hours as needed. For pain         . HYDROCODONE-ACETAMINOPHEN 5-325 MG PO TABS   Oral   Take 1 tablet by mouth every 4 (four) hours as needed. For pain         . IBUPROFEN 200 MG PO TABS   Oral   Take 600 mg by mouth every 6 (six) hours as needed. For pain         . ONDANSETRON 4 MG PO TBDP   Oral   Take 1 tablet (4 mg total) by mouth every 8 (eight) hours as needed for nausea.   10 tablet   0   . PROMETHAZINE HCL 25 MG RE SUPP   Rectal   Place 1 suppository (25 mg total) rectally every 6 (six) hours as needed for nausea.   12 each   0   . PROMETHAZINE HCL 25 MG PO TABS   Oral   Take 1 tablet (25 mg total) by mouth every 6 (six) hours as needed for nausea.   12 tablet   0   . PROMETHAZINE HCL 50 MG RE SUPP  Rectal   Place 1 suppository (50 mg total) rectally every 6 (six) hours as needed for nausea.   12 each   2     BP 111/59  Pulse 43  Temp 98 F (36.7 C) (Oral)  Resp 16  SpO2 96%  Physical Exam  Nursing note and vitals reviewed. Constitutional: He appears well-developed and well-nourished. No distress.  HENT:  Head: Normocephalic and atraumatic.  Mouth/Throat: Oropharynx is clear and moist. No oropharyngeal exudate.  Eyes: Conjunctivae normal and EOM are normal. Pupils are equal, round, and reactive to light. Right eye exhibits no discharge. Left eye exhibits no discharge. No scleral icterus.  Neck: Normal range of motion. Neck supple. No JVD present. No thyromegaly present.  Cardiovascular: Normal rate, regular rhythm, normal heart sounds and intact distal pulses.  Exam reveals no gallop and no friction rub.   No murmur heard. Pulmonary/Chest: Effort normal and breath sounds normal. No respiratory  distress. He has no wheezes. He has no rales.  Abdominal: Soft. Bowel sounds are normal. He exhibits no distension and no mass. There is tenderness ( Mild to moderate epigastric tenderness, no guarding, no masses, no peritonitis).  Musculoskeletal: Normal range of motion. He exhibits no edema and no tenderness.  Lymphadenopathy:    He has no cervical adenopathy.  Neurological: He is alert. Coordination normal.  Skin: Skin is warm and dry. No rash noted. No erythema.  Psychiatric: He has a normal mood and affect. His behavior is normal.    ED Course  Procedures (including critical care time)  Labs Reviewed  COMPREHENSIVE METABOLIC PANEL - Abnormal; Notable for the following:    Sodium 130 (*)     Chloride 92 (*)     Glucose, Bld 112 (*)     All other components within normal limits  CBC WITH DIFFERENTIAL - Abnormal; Notable for the following:    MCHC 36.6 (*)     All other components within normal limits  URINALYSIS, ROUTINE W REFLEX MICROSCOPIC - Abnormal; Notable for the following:    APPearance CLOUDY (*)     Hgb urine dipstick TRACE (*)     Ketones, ur 15 (*)     All other components within normal limits  LIPASE, BLOOD  URINE MICROSCOPIC-ADD ON   No results found.   1. Nausea and vomiting   2. Dehydration       MDM  The patient does not appear overtly dehydrated, his pulse is 64, blood pressure is normal, he is afebrile and has a soft abdomen with minimal tenderness in the epigastrium. We'll check lipase, basic labs including a urinalysis to evaluate for a level of dehydration, antiemetics and IV fluid rehydration.  Pt has tolerated PO, labs unremarkable, mild ketones in the UA, no other problems.  Stable for d/c.      Vida Roller, MD 05/17/12 [phone]

## 2012-05-16 NOTE — ED Notes (Signed)
Patient states for 3 weeks he has had continuous nausea and vomiting, vomiting @30  times today, medium amount. Patient states he has tried taking medication for motion sickness and phenergan at home with no relief. Patient reports he has also had diarrhea but "not a lot because I haven't been eating much". Patient states nausea is increased with food and smoke. Patient was at [facility] earlier but left due to the wait time, patient states blood was drawn there, but no other treatments were received. Patient also c/o upper mid-epigastric abd pain. Patient placed on cardiac monitor.

## 2012-05-17 LAB — CBC WITH DIFFERENTIAL/PLATELET
Eosinophils Absolute: 0.1 10*3/uL (ref 0.0–0.7)
Hemoglobin: 14.8 g/dL (ref 13.0–17.0)
Lymphocytes Relative: 15 % (ref 12–46)
Lymphs Abs: 1.4 10*3/uL (ref 0.7–4.0)
MCH: 33.5 pg (ref 26.0–34.0)
MCV: 91.4 fL (ref 78.0–100.0)
Monocytes Relative: 7 % (ref 3–12)
Neutrophils Relative %: 76 % (ref 43–77)
RBC: 4.42 MIL/uL (ref 4.22–5.81)
WBC: 9.2 10*3/uL (ref 4.0–10.5)

## 2012-05-17 LAB — COMPREHENSIVE METABOLIC PANEL
AST: 23 U/L (ref 0–37)
Albumin: 4.5 g/dL (ref 3.5–5.2)
BUN: 22 mg/dL (ref 6–23)
Calcium: 10.1 mg/dL (ref 8.4–10.5)
Creatinine, Ser: 1.06 mg/dL (ref 0.50–1.35)
GFR calc non Af Amer: >90 mL/min (ref >90)

## 2012-05-17 LAB — URINALYSIS, ROUTINE W REFLEX MICROSCOPIC
Glucose, UA: NEGATIVE mg/dL
Leukocytes, UA: NEGATIVE
Protein, ur: NEGATIVE mg/dL
pH: 7.5 (ref 5.0–8.0)

## 2012-05-17 LAB — URINE MICROSCOPIC-ADD ON

## 2012-05-17 LAB — LIPASE, BLOOD: Lipase: 25 U/L (ref 11–59)

## 2012-05-17 MED ORDER — PROMETHAZINE HCL 25 MG RE SUPP: 25.0000 mg | Freq: Four times a day (QID) | RECTAL | Status: DC | PRN

## 2012-05-17 MED ORDER — OXYCODONE-ACETAMINOPHEN 5-325 MG PO TABS
1.0000 | ORAL_TABLET | Freq: Once | ORAL | Status: AC
  Administered 2012-05-17: 1 via ORAL
  Filled 2012-05-17 (×2): qty 1

## 2012-05-17 MED ORDER — METOCLOPRAMIDE HCL 5 MG/ML IJ SOLN
10.0000 mg | Freq: Once | INTRAMUSCULAR | Status: AC
  Administered 2012-05-17: 10 mg via INTRAVENOUS
  Filled 2012-05-17: qty 2

## 2012-05-17 MED ORDER — PROMETHAZINE HCL 25 MG/ML IJ SOLN
25.0000 mg | Freq: Once | INTRAMUSCULAR | Status: AC
  Administered 2012-05-17: 25 mg via INTRAVENOUS
  Filled 2012-05-17: qty 1

## 2012-05-17 MED ORDER — ONDANSETRON 4 MG PO TBDP: 4.0000 mg | ORAL_TABLET | Freq: Three times a day (TID) | ORAL | Status: DC | PRN

## 2012-05-17 MED ORDER — PROMETHAZINE HCL 25 MG PO TABS: 25.0000 mg | ORAL_TABLET | Freq: Four times a day (QID) | ORAL | Status: DC | PRN

## 2012-05-17 MED ORDER — SODIUM CHLORIDE 0.9 % IV BOLUS (SEPSIS)
1000.0000 mL | Freq: Once | INTRAVENOUS | Status: AC
  Administered 2012-05-17: 1000 mL via INTRAVENOUS

## 2012-05-22 ENCOUNTER — Inpatient Hospital Stay (HOSPITAL_COMMUNITY)
Admission: EM | Admit: 2012-05-22 | Discharge: 2012-05-25 | DRG: 392 | Disposition: A | Discharge status: Home / Self Care | Payer: MEDICAID | Attending: Internal Medicine | Admitting: Internal Medicine

## 2012-05-22 ENCOUNTER — Encounter (HOSPITAL_COMMUNITY): Payer: Self-pay | Admitting: *Deleted

## 2012-05-22 DIAGNOSIS — E860 Dehydration: Secondary | ICD-10-CM | POA: Diagnosis present

## 2012-05-22 DIAGNOSIS — R011 Cardiac murmur, unspecified: Secondary | ICD-10-CM | POA: Diagnosis present

## 2012-05-22 DIAGNOSIS — R1013 Epigastric pain: Secondary | ICD-10-CM | POA: Diagnosis present

## 2012-05-22 DIAGNOSIS — E876 Hypokalemia: Secondary | ICD-10-CM | POA: Diagnosis present

## 2012-05-22 DIAGNOSIS — R1115 Cyclical vomiting syndrome unrelated to migraine: Secondary | ICD-10-CM | POA: Diagnosis present

## 2012-05-22 DIAGNOSIS — D72829 Elevated white blood cell count, unspecified: Secondary | ICD-10-CM | POA: Diagnosis present

## 2012-05-22 DIAGNOSIS — K219 Gastro-esophageal reflux disease without esophagitis: Secondary | ICD-10-CM | POA: Diagnosis present

## 2012-05-22 DIAGNOSIS — Z79899 Other long term (current) drug therapy: Secondary | ICD-10-CM

## 2012-05-22 DIAGNOSIS — Z87891 Personal history of nicotine dependence: Secondary | ICD-10-CM

## 2012-05-22 DIAGNOSIS — E871 Hypo-osmolality and hyponatremia: Secondary | ICD-10-CM | POA: Diagnosis present

## 2012-05-22 DIAGNOSIS — Z87442 Personal history of urinary calculi: Secondary | ICD-10-CM

## 2012-05-22 LAB — CBC WITH DIFFERENTIAL/PLATELET
Basophils Absolute: 0 10*3/uL (ref 0.0–0.1)
Basophils Relative: 0 % (ref 0–1)
Eosinophils Absolute: 0 10*3/uL (ref 0.0–0.7)
MCH: 33.3 pg (ref 26.0–34.0)
MCHC: 36.4 g/dL — ABNORMAL HIGH (ref 30.0–36.0)
Monocytes Relative: 2 % — ABNORMAL LOW (ref 3–12)
Neutro Abs: 10.9 10*3/uL — ABNORMAL HIGH (ref 1.7–7.7)
Neutrophils Relative %: 91 % — ABNORMAL HIGH (ref 43–77)
Platelets: 189 10*3/uL (ref 150–400)
RDW: 11.8 % (ref 11.5–15.5)

## 2012-05-22 LAB — COMPREHENSIVE METABOLIC PANEL
ALT: 29 U/L (ref 0–53)
Albumin: 4.8 g/dL (ref 3.5–5.2)
Alkaline Phosphatase: 97 U/L (ref 39–117)
Calcium: 10.4 mg/dL (ref 8.4–10.5)
GFR calc Af Amer: >90 mL/min (ref >90)
Glucose, Bld: 131 mg/dL — ABNORMAL HIGH (ref 70–99)
Potassium: 3.3 mEq/L — ABNORMAL LOW (ref 3.5–5.1)
Sodium: 134 mEq/L — ABNORMAL LOW (ref 135–145)
Total Protein: 8.2 g/dL (ref 6.0–8.3)

## 2012-05-22 MED ORDER — ONDANSETRON HCL 4 MG/2ML IJ SOLN
4.0000 mg | Freq: Once | INTRAMUSCULAR | Status: AC
  Administered 2012-05-22: 4 mg via INTRAVENOUS
  Filled 2012-05-22: qty 2

## 2012-05-22 MED ORDER — SODIUM CHLORIDE 0.9 % IV BOLUS (SEPSIS)
1000.0000 mL | Freq: Once | INTRAVENOUS | Status: AC
  Administered 2012-05-22: 1000 mL via INTRAVENOUS

## 2012-05-22 MED ORDER — PANTOPRAZOLE SODIUM 40 MG IV SOLR
40.0000 mg | Freq: Once | INTRAVENOUS | Status: AC
  Administered 2012-05-22: 40 mg via INTRAVENOUS
  Filled 2012-05-22: qty 40

## 2012-05-22 MED ORDER — LORAZEPAM 2 MG/ML IJ SOLN
1.0000 mg | Freq: Once | INTRAMUSCULAR | Status: AC
  Administered 2012-05-22: 1 mg via INTRAVENOUS
  Filled 2012-05-22: qty 1

## 2012-05-22 MED ORDER — DIPHENHYDRAMINE HCL 50 MG/ML IJ SOLN
25.0000 mg | Freq: Once | INTRAMUSCULAR | Status: AC
  Administered 2012-05-22: 25 mg via INTRAVENOUS
  Filled 2012-05-22: qty 1

## 2012-05-22 MED ORDER — METHYLPREDNISOLONE SODIUM SUCC 125 MG IJ SOLR
125.0000 mg | Freq: Once | INTRAMUSCULAR | Status: AC
  Administered 2012-05-22: 125 mg via INTRAVENOUS
  Filled 2012-05-22: qty 2

## 2012-05-22 MED ORDER — METOCLOPRAMIDE HCL 5 MG/ML IJ SOLN
10.0000 mg | Freq: Once | INTRAMUSCULAR | Status: AC
  Administered 2012-05-22: 10 mg via INTRAVENOUS
  Filled 2012-05-22: qty 2

## 2012-05-22 NOTE — ED Notes (Signed)
Patient is alert and oriented x3.  He is complaining of cramping in his stomach with nausea and vomiting  That started 2 weeks ago.  He has been seen at [facility] and [facility] for same issue.  Patient is actively vomiting. Currently he rates his pain 10 of 10 in his abdomen.

## 2012-05-22 NOTE — ED Notes (Signed)
Asked pt if he was able to provide a urine sample.  Pt unable to void at this time.

## 2012-05-22 NOTE — ED Provider Notes (Signed)
History     CSN: [id number]  Arrival date & time 05/22/12  4098   First MD Initiated Contact with Patient 05/22/12 2021      Chief Complaint  Patient presents with  . Nausea  . Emesis   HPI  History provided by the patient and recent medical chart. Patient is a 26 year old male with history of GERD, kidney stones and cyclic vomiting syndrome who presents with complaints of continued nausea and vomiting symptoms. Patient reports he has had waxing waning nausea vomiting symptoms for the past 2 weeks. Symptoms have progressively worsened with increased abdominal cramping some pain. Patient was seen last week emergency room for similar symptoms. Didn't begin the feel slightly better after medications and IV fluids. He was discharged and has prescriptions for Phenergan which she has tried to take at home without significant improvement of symptoms. Patient was formally followed by GI specialist, Dr. Arlyce Dice but is not followed up in over a year. He denies any other complaints. Denies any fever, chills or sweats. Denies any diarrhea or constipation.    Past Medical History  Diagnosis Date  . GERD (gastroesophageal reflux disease)   . Nephrolithiasis   . Cyclic vomiting syndrome     Past Surgical History  Procedure Date  . Unremarkable     Family History  Problem Relation Age of Onset  . Colon cancer Maternal Uncle   . Liver cancer Mother   . Diabetes Maternal Aunt   . Heart disease Paternal Grandfather   . Testicular cancer Brother     History  Substance Use Topics  . Smoking status: Former Smoker -- 1 years    Types: Cigarettes    Quit date: 06/04/2007  . Smokeless tobacco: Never Used     Comment: see illicit drug comment  . Alcohol Use: Yes     Comment: occasional-once per month      Review of Systems  Constitutional: Positive for appetite change. Negative for fever and chills.  Cardiovascular: Negative for chest pain.  Gastrointestinal: Positive for nausea,  vomiting and abdominal pain. Negative for diarrhea, constipation and blood in stool.  All other systems reviewed and are negative.    Allergies  Review of patient's allergies indicates no known allergies.  Home Medications   Current Outpatient Rx  Name  Route  Sig  Dispense  Refill  . ACETAMINOPHEN 500 MG PO TABS   Oral   Take 1,000 mg by mouth every 6 (six) hours as needed. For pain         . HYDROCODONE-ACETAMINOPHEN 5-325 MG PO TABS   Oral   Take 1 tablet by mouth every 4 (four) hours as needed. For pain         . IBUPROFEN 200 MG PO TABS   Oral   Take 600 mg by mouth every 6 (six) hours as needed. For pain         . ONDANSETRON 4 MG PO TBDP   Oral   Take 1 tablet (4 mg total) by mouth every 8 (eight) hours as needed for nausea.   10 tablet   0   . PROMETHAZINE HCL 25 MG RE SUPP   Rectal   Place 1 suppository (25 mg total) rectally every 6 (six) hours as needed for nausea.   12 each   0   . PROMETHAZINE HCL 25 MG PO TABS   Oral   Take 1 tablet (25 mg total) by mouth every 6 (six) hours as needed for nausea.  12 tablet   0   . PROMETHAZINE HCL 50 MG RE SUPP   Rectal   Place 1 suppository (50 mg total) rectally every 6 (six) hours as needed for nausea.   12 each   2     BP 133/86  Temp 97.3 F (36.3 C) (Oral)  Resp 20  SpO2 100%  Physical Exam  Nursing note and vitals reviewed. Constitutional: He appears well-developed and well-nourished. No distress.  HENT:  Head: Normocephalic.  Cardiovascular: Normal rate and regular rhythm.   Pulmonary/Chest: Effort normal and breath sounds normal. No respiratory distress. He has no wheezes. He has no rales.  Abdominal: Soft. There is no tenderness. There is no rebound and no guarding.       Abdomen soft without apparent signs of tenderness  Neurological: He is alert.  Skin: Skin is warm.  Psychiatric: He has a normal mood and affect. His behavior is normal.    ED Course  Procedures   Results for  orders placed during the hospital encounter of 05/22/12  CBC WITH DIFFERENTIAL      Component Value Range   WBC 12.0 (*) 4.0 - 10.5 K/uL   RBC 4.54  4.22 - 5.81 MIL/uL   Hemoglobin 15.1  13.0 - 17.0 g/dL   HCT 78.2  95.6 - 21.3 %   MCV 91.4  78.0 - 100.0 fL   MCH 33.3  26.0 - 34.0 pg   MCHC 36.4 (*) 30.0 - 36.0 g/dL   RDW 08.6  57.8 - 46.9 %   Platelets 189  150 - 400 K/uL   Neutrophils Relative 91 (*) 43 - 77 %   Neutro Abs 10.9 (*) 1.7 - 7.7 K/uL   Lymphocytes Relative 6 (*) 12 - 46 %   Lymphs Abs 0.8  0.7 - 4.0 K/uL   Monocytes Relative 2 (*) 3 - 12 %   Monocytes Absolute 0.3  0.1 - 1.0 K/uL   Eosinophils Relative 0  0 - 5 %   Eosinophils Absolute 0.0  0.0 - 0.7 K/uL   Basophils Relative 0  0 - 1 %   Basophils Absolute 0.0  0.0 - 0.1 K/uL  LIPASE, BLOOD      Component Value Range   Lipase 12  11 - 59 U/L  COMPREHENSIVE METABOLIC PANEL      Component Value Range   Sodium 134 (*) 135 - 145 mEq/L   Potassium 3.3 (*) 3.5 - 5.1 mEq/L   Chloride 95 (*) 96 - 112 mEq/L   CO2 26  19 - 32 mEq/L   Glucose, Bld 131 (*) 70 - 99 mg/dL   BUN 15  6 - 23 mg/dL   Creatinine, Ser 6.29  0.50 - 1.35 mg/dL   Calcium 52.8  8.4 - 41.3 mg/dL   Total Protein 8.2  6.0 - 8.3 g/dL   Albumin 4.8  3.5 - 5.2 g/dL   AST 43 (*) 0 - 37 U/L   ALT 29  0 - 53 U/L   Alkaline Phosphatase 97  39 - 117 U/L   Total Bilirubin 1.1  0.3 - 1.2 mg/dL   GFR calc non Af Amer >90  >90 mL/min   GFR calc Af Amer >90  >90 mL/min       No results found.   1. Cyclic vomiting syndrome   2. Dehydration, mild   3. Heart murmur       MDM  8:20 PM patient seen and evaluated. Patient lying in bed appears  in mild discomfort no acute distress.   Patient reports having prior diagnosis of cyclic vomiting syndrome previously followed by GI specialist, Dr. Arlyce Dice. Past workup included swallowing studies and endoscopies. Patient does report having sensitivity to gluten but is unsure of any diagnosis of  celiac.  Patient reports having a recent CT scan of his abdomen in [place].  Patient continues to report nausea vomiting symptoms despite medications.  Spoke with [facility]. They will see patient and evaluate for admission.  Angus Seller, [place] 05/23/12 [phone]

## 2012-05-22 NOTE — ED Notes (Signed)
Pt states he will attempt to give urine specimen after he gets fluids. RN and PA are informed

## 2012-05-22 NOTE — ED Notes (Addendum)
Have attempted to collect urine specimen 6 times and pt states he is still unable to void at this time. Pt was given some water per order and has kept it down

## 2012-05-23 ENCOUNTER — Encounter (HOSPITAL_COMMUNITY): Payer: Self-pay

## 2012-05-23 ENCOUNTER — Observation Stay (HOSPITAL_COMMUNITY): Payer: Self-pay

## 2012-05-23 DIAGNOSIS — R011 Cardiac murmur, unspecified: Secondary | ICD-10-CM

## 2012-05-23 DIAGNOSIS — R1013 Epigastric pain: Secondary | ICD-10-CM

## 2012-05-23 DIAGNOSIS — E860 Dehydration: Secondary | ICD-10-CM | POA: Diagnosis present

## 2012-05-23 DIAGNOSIS — R1115 Cyclical vomiting syndrome unrelated to migraine: Principal | ICD-10-CM

## 2012-05-23 LAB — BASIC METABOLIC PANEL
Calcium: 9.3 mg/dL (ref 8.4–10.5)
Creatinine, Ser: 0.87 mg/dL (ref 0.50–1.35)
GFR calc Af Amer: >90 mL/min (ref >90)
GFR calc non Af Amer: >90 mL/min (ref >90)
Sodium: 129 mEq/L — ABNORMAL LOW (ref 135–145)

## 2012-05-23 LAB — URINALYSIS, ROUTINE W REFLEX MICROSCOPIC
Bilirubin Urine: NEGATIVE
Glucose, UA: NEGATIVE mg/dL
Leukocytes, UA: NEGATIVE
Nitrite: NEGATIVE
Specific Gravity, Urine: 1.025 (ref 1.005–1.030)
pH: 8 (ref 5.0–8.0)

## 2012-05-23 LAB — URINE MICROSCOPIC-ADD ON

## 2012-05-23 MED ORDER — LORAZEPAM 1 MG PO TABS: 1.0000 mg | ORAL_TABLET | ORAL | Status: DC | PRN

## 2012-05-23 MED ORDER — ONDANSETRON HCL 4 MG/2ML IJ SOLN
4.0000 mg | Freq: Four times a day (QID) | INTRAMUSCULAR | Status: DC | PRN
  Administered 2012-05-24: 4 mg via INTRAVENOUS
  Filled 2012-05-23: qty 2

## 2012-05-23 MED ORDER — KCL IN DEXTROSE-NACL 20-5-0.9 MEQ/L-%-% IV SOLN
INTRAVENOUS | Status: DC
  Administered 2012-05-23 (×2): via INTRAVENOUS
  Filled 2012-05-23 (×3): qty 1000

## 2012-05-23 MED ORDER — SODIUM CHLORIDE 0.9 % IV SOLN
INTRAVENOUS | Status: DC
  Administered 2012-05-23 – 2012-05-24 (×3): via INTRAVENOUS

## 2012-05-23 MED ORDER — INFLUENZA VIRUS VACC SPLIT PF IM SUSP
0.5000 mL | INTRAMUSCULAR | Status: AC
  Filled 2012-05-23: qty 0.5

## 2012-05-23 MED ORDER — ONDANSETRON HCL 4 MG PO TABS: 4.0000 mg | ORAL_TABLET | Freq: Four times a day (QID) | ORAL | Status: DC | PRN

## 2012-05-23 MED ORDER — PANTOPRAZOLE SODIUM 40 MG PO TBEC
40.0000 mg | DELAYED_RELEASE_TABLET | Freq: Every day | ORAL | Status: DC
  Administered 2012-05-23 – 2012-05-25 (×3): 40 mg via ORAL
  Filled 2012-05-23 (×3): qty 1

## 2012-05-23 MED ORDER — MORPHINE SULFATE 2 MG/ML IJ SOLN
2.0000 mg | Freq: Once | INTRAMUSCULAR | Status: AC
  Administered 2012-05-23: 2 mg via INTRAVENOUS
  Filled 2012-05-23: qty 1

## 2012-05-23 NOTE — Progress Notes (Signed)
  Echocardiogram 2D Echocardiogram has been performed.  Erik Cowan 05/23/2012, 10:07 AM

## 2012-05-23 NOTE — Progress Notes (Signed)
Patient tolerated full liquid diet well, switched to DYS III diet soft foods, will continue to monitor

## 2012-05-23 NOTE — H&P (Signed)
[facility] History and Physical  Erik Cowan ZOX:[id number] DOB: 1985/11/28    PCP:   Melvia Heaps, MD   Chief Complaint: persistent nausea and vomiting  HPI: Erik Cowan is an 26 y.o. male with hx of cyclic vomiting syndrome, GERD, Hx of kidney stone, presents to ER with nausea and vomiting for at least a couple of weeks.  He was seen at an outside urgent care and reportedly had a negative abdominal CT.  Evaluation here in the ER included an normal lipase of 12, WBC of 12K, Hb of 15g/DL, unremarkable liver and renal fx tests.  His K is slightly low at 3.3.  He was treated for four hours in the ER, and hospitalist was asked to admit him for further symptomatic tx of intractable nausea and vomiting.   Rewiew of Systems:  Constitutional: Negative for malaise, fever and chills. No significant weight loss or weight gain Eyes: Negative for eye pain, redness and discharge, diplopia, visual changes, or flashes of light. ENMT: Negative for ear pain, hoarseness, nasal congestion, sinus pressure and sore throat. No headaches; tinnitus, drooling, or problem swallowing. Cardiovascular: Negative for chest pain, palpitations, diaphoresis, dyspnea and peripheral edema. ; No orthopnea, PND Respiratory: Negative for cough, hemoptysis, wheezing and stridor. No pleuritic chestpain. Gastrointestinal: Negative fordiarrhea, constipation, abdominal pain, melena, blood in stool, hematemesis, jaundice and rectal bleeding.    Genitourinary: Negative for frequency, dysuria, incontinence,flank pain and hematuria; Musculoskeletal: Negative for back pain and neck pain. Negative for swelling and trauma.;  Skin: . Negative for pruritus, rash, abrasions, bruising and skin lesion.; ulcerations Neuro: Negative for headache, lightheadedness and neck stiffness. Negative for weakness, altered level of consciousness , altered mental status, extremity weakness, burning feet, involuntary movement, seizure and  syncope.  Psych: negative for anxiety, depression, insomnia, tearfulness, panic attacks, hallucinations, paranoia, suicidal or homicidal ideation   Past Medical History  Diagnosis Date  . GERD (gastroesophageal reflux disease)   . Nephrolithiasis   . Cyclic vomiting syndrome     Past Surgical History  Procedure Date  . Unremarkable     Medications:  HOME MEDS: Prior to Admission medications   Medication Sig Start Date End Date Taking? Authorizing Provider  acetaminophen (TYLENOL) 500 MG tablet Take 1,000 mg by mouth every 6 (six) hours as needed. For pain   Yes Historical Provider, MD  HYDROcodone-acetaminophen (NORCO/VICODIN) 5-325 MG per tablet Take 1 tablet by mouth every 4 (four) hours as needed. For pain 02/23/12  Yes McAllister, PA  ibuprofen (ADVIL,MOTRIN) 200 MG tablet Take 600 mg by mouth every 6 (six) hours as needed. For pain   Yes Historical Provider, MD  ondansetron (ZOFRAN ODT) 4 MG disintegrating tablet Take 1 tablet (4 mg total) by mouth every 8 (eight) hours as needed for nausea. 05/17/12  Yes Vida Roller, MD  promethazine (PHENERGAN) 25 MG tablet Take 1 tablet (25 mg total) by mouth every 6 (six) hours as needed for nausea. 05/17/12  Yes Vida Roller, MD     Allergies:  No Known Allergies  Social History:   reports that he quit smoking about 4 years ago. His smoking use included Cigarettes. He quit after 1 year of use. He has never used smokeless tobacco. He reports that he drinks alcohol. He reports that he uses illicit drugs about 7 times per week.  Family History: Family History  Problem Relation Age of Onset  . Colon cancer Maternal Uncle   . Liver cancer Mother   . Diabetes Maternal Aunt   .  Heart disease Paternal Grandfather   . Testicular cancer Brother      Physical Exam: Filed Vitals:   05/22/12 2006  BP: 133/86  Temp: 97.3 F (36.3 C)  TempSrc: Oral  Resp: 20  SpO2: 100%   Blood pressure 133/86, temperature 97.3 F (36.3 C),  temperature source Oral, resp. rate 20, SpO2 100.00%.  GEN:  Pleasant  patient lying in the stretcher in no acute distress; cooperative with exam. He appears comfortable and sleeping. PSYCH:  alert and oriented x4; does not appear anxious or depressed; affect is appropriate. HEENT: Mucous membranes pink and anicteric; PERRLA; EOM intact; no cervical lymphadenopathy nor thyromegaly or carotid bruit; no JVD; There were no stridor. Neck is very supple. Breasts:: Not examined CHEST WALL: No tenderness CHEST: Normal respiration, clear to auscultation bilaterally.  HEART: Regular rate and rhythm.  There are no murmur, rub, or gallops.   BACK: No kyphosis or scoliosis; no CVA tenderness ABDOMEN: soft and non-tender; no masses, no organomegaly, normal abdominal bowel sounds; no pannus; no intertriginous candida. There is no rebound and no distention. Rectal Exam: Not done EXTREMITIES: No bone or joint deformity; age-appropriate arthropathy of the hands and knees; no edema; no ulcerations.  There is no calf tenderness. Genitalia: not examined PULSES: 2+ and symmetric SKIN: Normal hydration no rash or ulceration CNS: Cranial nerves 2-12 grossly intact no focal lateralizing neurologic deficit.  Speech is fluent; uvula elevated with phonation, facial symmetry and tongue midline. DTR are normal bilaterally, cerebella exam is intact, barbinski is negative and strengths are equaled bilaterally.  No sensory loss.   Labs on Admission:  Basic Metabolic Panel:  Lab 05/22/12 4540 05/16/12 2354 05/16/12 2234  NA 134* 130* 134*  K 3.3* 3.5 3.5  CL 95* 92* 95*  CO2 26 26 28   GLUCOSE 131* 112* 102*  BUN 15 22 22   CREATININE 0.98 1.06 1.09  CALCIUM 10.4 10.1 10.1  MG -- -- --  PHOS -- -- --   Liver Function Tests:  Lab 05/22/12 2032 05/16/12 2354 05/16/12 2234  AST 43* 23 21  ALT 29 18 18   ALKPHOS 97 91 90  BILITOT 1.1 0.7 0.7  PROT 8.2 7.7 7.8  ALBUMIN 4.8 4.5 4.5    Lab 05/22/12 2032 05/16/12  2354 05/16/12 2234  LIPASE 12 25 34  AMYLASE -- -- --   No results found for this basename: AMMONIA:5 in the last 168 hours CBC:  Lab 05/22/12 2032 05/16/12 2354 05/16/12 2234  WBC 12.0* 9.2 7.9  NEUTROABS 10.9* 7.1 4.3  HGB 15.1 14.8 15.0  HCT 41.5 40.4 41.2  MCV 91.4 91.4 91.6  PLT 189 161 163   Cardiac Enzymes: No results found for this basename: CKTOTAL:5,CKMB:5,CKMBINDEX:5,TROPONINI:5 in the last 168 hours  CBG: No results found for this basename: GLUCAP:5 in the last 168 hours   Radiological Exams on Admission: No results found.   Assessment/Plan Present on Admission:  . Cyclic vomiting syndrome . Heart murmur . Dehydration, mild  PLAN:  Will admit him for intractable nausea and vomiting with dehydration because of cyclic nausea and vomiting syndrome.  Will replete his K.  He didn't know anything about his heart murmur, so I will obtain a cardiac ECHO.  He is otherwise stable, full code, and will be admitted to [facility] service.  Other plans as per orders.  Code Status: FULL Unk Lightning, MD. [facility] Pager [phone] 7pm to 7am.  05/23/2012, 1:03 AM

## 2012-05-23 NOTE — Progress Notes (Signed)
Patient was admitted earlier today. H&P reviewed.  Patient is feeling better. Pain is better. No vomiting. Tolerated liquids.  VSS Lungs are CTA Abdomen: Soft, NT. BS present. No masses or organomegaly. Neuro: Alert and oriented x 3. No focal deficits.  Patient here with nausea/vomiting and abdominal pain. Has history of cyclical vomiting syndrome.  Seems to be improving. Advance diet AAS was negative for acute process. Follow ECHO report. Check labs in AM.  [facility] 05/23/2012 12:07 PM

## 2012-05-23 NOTE — Progress Notes (Signed)
Patient complaining of midline abdominal pain just behind naval region (10 out of 10), Paged Md, Gave 2mg  Morphine, Patient resting now, will continue to monitor

## 2012-05-24 LAB — COMPREHENSIVE METABOLIC PANEL
ALT: 25 U/L (ref 0–53)
AST: 24 U/L (ref 0–37)
Albumin: 3.6 g/dL (ref 3.5–5.2)
CO2: 25 mEq/L (ref 19–32)
Chloride: 98 mEq/L (ref 96–112)
Creatinine, Ser: 0.94 mg/dL (ref 0.50–1.35)
GFR calc non Af Amer: >90 mL/min (ref >90)
Sodium: 130 mEq/L — ABNORMAL LOW (ref 135–145)
Total Bilirubin: 0.6 mg/dL (ref 0.3–1.2)

## 2012-05-24 LAB — CBC
MCV: 93.5 fL (ref 78.0–100.0)
Platelets: 152 10*3/uL (ref 150–400)
RBC: 4.02 MIL/uL — ABNORMAL LOW (ref 4.22–5.81)
RDW: 12 % (ref 11.5–15.5)
WBC: 6.6 10*3/uL (ref 4.0–10.5)

## 2012-05-24 MED ORDER — ONDANSETRON 8 MG/NS 50 ML IVPB
8.0000 mg | Freq: Four times a day (QID) | INTRAVENOUS | Status: DC | PRN
  Administered 2012-05-24: 8 mg via INTRAVENOUS
  Filled 2012-05-24 (×2): qty 8

## 2012-05-24 MED ORDER — MORPHINE SULFATE 4 MG/ML IJ SOLN
4.0000 mg | INTRAMUSCULAR | Status: DC | PRN
  Administered 2012-05-24: 6 mg via INTRAVENOUS
  Administered 2012-05-24 (×2): 4 mg via INTRAVENOUS
  Filled 2012-05-24: qty 2
  Filled 2012-05-24 (×2): qty 1

## 2012-05-24 MED ORDER — MORPHINE SULFATE 2 MG/ML IJ SOLN
2.0000 mg | INTRAMUSCULAR | Status: DC | PRN
  Administered 2012-05-24: 2 mg via INTRAVENOUS

## 2012-05-24 MED ORDER — ENOXAPARIN SODIUM 40 MG/0.4ML ~~LOC~~ SOLN
40.0000 mg | Freq: Every day | SUBCUTANEOUS | Status: DC
  Administered 2012-05-24: 40 mg via SUBCUTANEOUS
  Filled 2012-05-24 (×2): qty 0.4

## 2012-05-24 MED ORDER — HYDROCODONE-ACETAMINOPHEN 5-325 MG PO TABS
1.0000 | ORAL_TABLET | ORAL | Status: DC | PRN
  Filled 2012-05-24: qty 2

## 2012-05-24 MED ORDER — MORPHINE SULFATE 2 MG/ML IJ SOLN
INTRAMUSCULAR | Status: AC
  Filled 2012-05-24: qty 1

## 2012-05-24 NOTE — Progress Notes (Signed)
[facility] PROGRESS NOTE  Erik Cowan ZOX:[id number] DOB: 1985/11/30 DOA: 05/22/2012  PCP: Melvia Heaps, MD  Brief HPI: Erik Cowan is an 26 y.o. male with hx of cyclic vomiting syndrome, GERD, Hx of kidney stone, presents to ER with nausea and vomiting for at least a couple of weeks. He was seen at an outside urgent care and reportedly had a negative abdominal CT. Evaluation here in the ER included an normal lipase of 12, WBC of 12K, Hb of 15g/DL, unremarkable liver and renal fx tests. His K is slightly low at 3.3. He was treated for four hours in the ER, and hospitalist was asked to admit him for further symptomatic tx of intractable nausea and vomiting.    Past medical history:  GERD (gastroesophageal reflux disease)   Nephrolithiasis   Cyclic vomiting syndrome   Consultants: None  Procedures: None  Antibiotics: None  Subjective: Continues to have nausea and vomiting. Says that usually morphine 6 mg works for him along with high dose zofran.   Objective: Vital Signs  Filed Vitals:   05/23/12 1334 05/23/12 1954 05/23/12 2138 05/24/12 0536  BP: 122/65  135/64 115/71  Pulse: 53  56 54  Temp: 98.3 F (36.8 C)  98.4 F (36.9 C) 98.3 F (36.8 C)  TempSrc: Oral  Oral Oral  Resp: 20  18 18   Height:  6\' 2"  (1.88 m)    Weight:  157 lb (71.215 kg)    SpO2: 98%  99% 99%    Intake/Output Summary (Last 24 hours) at 05/24/12 1030 Last data filed at 05/23/12 1900  Gross per 24 hour  Intake 2353.33 ml  Output      0 ml  Net 2353.33 ml   Filed Weights   05/23/12 1954  Weight: 157 lb (71.215 kg)    Intake/Output from previous day: 12/24 0701 - 12/25 0700 In: 2353.3 [P.O.:720; I.V.:1633.3] Out: -   BP 115/71  Pulse 54  Temp 98.3 F (36.8 C) (Oral)  Resp 18  Ht 6\' 2"  (1.88 m)  Wt 157 lb (71.215 kg)  BMI 20.16 kg/m2  SpO2 99%  General Appearance:    Alert, cooperative, no distress, appears stated age  Head:    Normocephalic, without obvious  abnormality, atraumatic  Eyes:    PERRL, conjunctiva/corneas clear, EOM's intact, fundi    benign, both eyes       Nose:   Nares normal, septum midline, mucosa normal, no drainage   or sinus tenderness  Throat:   Lips, mucosa, and tongue normal; teeth and gums normal  Neck:   Supple, symmetrical, trachea midline, no adenopathy;       thyroid:  No enlargement/tenderness/nodules; no carotid   bruit or JVD  Back:     Symmetric, no curvature, ROM normal, no CVA tenderness  Lungs:     Clear to auscultation bilaterally, respirations unlabored  Chest wall:    No tenderness or deformity  Heart:    Regular rate and rhythm, S1 and S2 normal, no murmur, rub   or gallop  Abdomen:     Soft, non-tender, bowel sounds active all four quadrants,    no masses, no organomegaly  Rectal:    Normal tone, normal prostate, no masses or tenderness;   guaiac negative stool  Extremities:   Extremities normal, atraumatic, no cyanosis or edema  Pulses:   2+ and symmetric all extremities  Skin:   Skin color, texture, turgor normal, no rashes or lesions  Lymph nodes:   Cervical, supraclavicular, and  axillary nodes normal  Neurologic:   CNII-XII intact. Normal strength, sensation and reflexes      throughout     Lab Results:  Basic Metabolic Panel:  Lab 05/24/12 1610 05/23/12 1255 05/22/12 2032  NA 130* 129* 134*  K 4.4 3.9 3.3*  CL 98 96 95*  CO2 25 24 26   GLUCOSE 98 128* 131*  BUN 13 12 15   CREATININE 0.94 0.87 0.98  CALCIUM 9.2 9.3 10.4  MG -- -- --  PHOS -- -- --   Liver Function Tests:  Lab 05/24/12 0505 05/22/12 2032  AST 24 43*  ALT 25 29  ALKPHOS 79 97  BILITOT 0.6 1.1  PROT 6.7 8.2  ALBUMIN 3.6 4.8    Lab 05/22/12 2032  LIPASE 12  AMYLASE --   No results found for this basename: AMMONIA:5 in the last 168 hours CBC:  Lab 05/24/12 0505 05/22/12 2032  WBC 6.6 12.0*  NEUTROABS -- 10.9*  HGB 13.0 15.1  HCT 37.6* 41.5  MCV 93.5 91.4  PLT 152 189      Studies/Results: Dg  Abd Acute W/chest  05/23/2012  *RADIOLOGY REPORT*  Clinical Data: Nausea, vomiting, abdominal pain, weight loss  ACUTE ABDOMEN SERIES (ABDOMEN 2 VIEW & CHEST 1 VIEW)  Comparison: 06/01/2011  Findings: Normal heart size and vascularity.  Lungs clear.  No focal pneumonia, collapse, consolidation, edema, effusion, or pneumothorax.  Trachea midline.  Negative for free air.  Normal bowel gas pattern without obstruction or dilatation.  Stable punctate 3 mm right renal calculus noted.  No osseous abnormality.  Slight curvature of the spine appears positional.  IMPRESSION: Negative for obstruction or free air.  No acute chest process  Stable right nephrolithiasis   Original Report Authenticated By: Judie Petit. Miles Costain, M.D.     Medications: I have reviewed the patient's current medications.  Assessment/Plan:  Active Problems:  Cyclic vomiting syndrome  Heart murmur  Dehydration, mild  Hyponatremia  Abdominal pain seems to have worsened as compared to yesterday. Patient continues to vomit. Will order ultrasound KUB to assess for obstruction from renal stone. Advance diet as tolerated. AAS was negative for acute process.  Follow ECHO report. Check labs in AM to monitor hyponatremia.     Code Status Full  DVT Prophylaxis SQ lovenox  Family Communication: Not available Disposition Plan: Will continue to stay for now.   Time spent: 35 mins  LOS: 2 days   Lars Mage  [facility] Pager [phone] 05/24/2012, 10:30 AM  If 8PM-8AM, please contact night-coverage at [email or web address], password [facility]

## 2012-05-24 NOTE — Progress Notes (Signed)
Patient rolled over and pulled out IV, Patient having severe pain rated 10/10, Page Md Terri Piedra about problem

## 2012-05-24 NOTE — ED Provider Notes (Signed)
Medical screening examination/treatment/procedure(s) were performed by non-physician practitioner and as supervising physician I was immediately available for consultation/collaboration.  Breyana Follansbee, MD 05/24/12 0014 

## 2012-05-24 NOTE — Progress Notes (Signed)
Patient resting comfortably in room, will continue to monitor

## 2012-05-25 ENCOUNTER — Inpatient Hospital Stay (HOSPITAL_COMMUNITY): Payer: Self-pay

## 2012-05-25 MED ORDER — HYDROCODONE-ACETAMINOPHEN 5-325 MG PO TABS: 1.0000 | ORAL_TABLET | ORAL | Status: DC | PRN

## 2012-05-25 MED ORDER — ONDANSETRON 8 MG PO TBDP: 8.0000 mg | ORAL_TABLET | Freq: Three times a day (TID) | ORAL | Status: DC | PRN

## 2012-05-25 NOTE — Discharge Summary (Signed)
Physician Discharge Summary  Erik Cowan ZOX:[id number] DOB: 17-Mar-1986 DOA: 05/22/2012  PCP: Melvia Heaps, MD  Admit date: 05/22/2012 Discharge date: 05/25/2012  Recommendations for Outpatient Follow-up:  1. Patient needs to follow up with Dr. Arlyce Dice as outpatient.   Discharge Diagnoses . Abdominal pain, epigastric unknown etiology  . GERD (gastroesophageal reflux disease)  . Nephrolithiasis  . Cyclic vomiting syndrome  . Hyponatremia  . Hypokalemia  . Leukocytosis  . Heart murmur  . Dehydration, mild     Discharge Condition: stable Disposition: The patient is being discharged home. He is instructed to follow up with Dr. Arlyce Dice in 1 week. The care manager is working with the patient to arrange for follow up with a PCP.   Diet recommendation: no restrictions  Filed Weights   05/23/12 1954 05/25/12 0835  Weight: 157 lb (71.215 kg) 171 lb 1.2 oz (77.6 kg)    History of present illness: Erik Cowan is an 26 y.o. male with hx of cyclic vomiting syndrome, GERD, Hx of kidney stone, presents to ER with nausea and vomiting for at least a couple of weeks. He was seen at an outside urgent care and reportedly had a negative abdominal CT. Evaluation here in the ER included an normal lipase of 12, WBC of 12K, Hb of 15g/DL, unremarkable liver and renal fx tests. His K is slightly low at 3.3. He was treated for four hours in the ER, and hospitalist was asked to admit him for further symptomatic tx of intractable nausea and vomiting.   Hospital Course: Patient was admitted with ongoing vomiting and abdominal pain. The pain and vomiting resolved with pain medications and high dose zofran. He was NPO initially and then was started on liquid diet prior to being discharged home on general diet. Abdominal ultrasound was done which did not reveal any hydronephrosis.    Discharge Instructions     Medication List     As of 05/25/2012  9:05 AM    ASK your doctor about these  medications         acetaminophen 500 MG tablet   Commonly known as: TYLENOL   Take 1,000 mg by mouth every 6 (six) hours as needed. For pain      HYDROcodone-acetaminophen 5-325 MG per tablet   Commonly known as: NORCO/VICODIN   Take 1 tablet by mouth every 4 (four) hours as needed. For pain      ibuprofen 200 MG tablet   Commonly known as: ADVIL,MOTRIN   Take 600 mg by mouth every 6 (six) hours as needed. For pain      ondansetron 4 MG disintegrating tablet   Commonly known as: ZOFRAN-ODT   Take 1 tablet (4 mg total) by mouth every 8 (eight) hours as needed for nausea.      promethazine 25 MG tablet   Commonly known as: PHENERGAN   Take 1 tablet (25 mg total) by mouth every 6 (six) hours as needed for nausea.          The results of significant diagnostics from this hospitalization (including imaging, microbiology, ancillary and laboratory) are listed below for reference.    Significant Diagnostic Studies: US Abdomen Complete  05/25/2012  *RADIOLOGY REPORT*  Clinical Data:  Abdominal pain.  History stones.  Assess for hydronephrosis.  COMPLETE ABDOMINAL ULTRASOUND  Comparison:  05/23/2012 plain film examination.  06/04/2011 CT.  Ultrasound worksheet not available.  I spoke directly with ultrasound technologist.  Findings:  Gallbladder:  No gallstones, gallbladder wall thickening, or pericholecystic fluid.  Common bile duct:  5.5 mm.  Liver:  No focal lesion identified.  Within normal limits in parenchymal echogenicity.  IVC:  Appears normal.  Pancreas:  No focal abnormality seen.  Spleen:  5.9 cm.  No focal mass.  Right Kidney:  11 cm. No hydronephrosis.  Previously noted right renal calculi not appreciated by ultrasound.  Left Kidney:  11.1 cm. No hydronephrosis or renal mass.  Abdominal aorta:  No aneurysm identified.  IMPRESSION: No hydronephrosis.  The previously noted nonobstructing right renal calculi are not appreciated on the present ultrasound.  Mild prominence of the  common bile duct measuring up to 5.5 mm. Etiology indeterminate.   Original Report Authenticated By: Lacy Duverney, M.D.    Dg Abd Acute W/chest  05/23/2012  *RADIOLOGY REPORT*  Clinical Data: Nausea, vomiting, abdominal pain, weight loss  ACUTE ABDOMEN SERIES (ABDOMEN 2 VIEW & CHEST 1 VIEW)  Comparison: 06/01/2011  Findings: Normal heart size and vascularity.  Lungs clear.  No focal pneumonia, collapse, consolidation, edema, effusion, or pneumothorax.  Trachea midline.  Negative for free air.  Normal bowel gas pattern without obstruction or dilatation.  Stable punctate 3 mm right renal calculus noted.  No osseous abnormality.  Slight curvature of the spine appears positional.  IMPRESSION: Negative for obstruction or free air.  No acute chest process  Stable right nephrolithiasis   Original Report Authenticated By: Judie Petit. Miles Costain, M.D.     Microbiology: No results found for this or any previous visit (from the past 240 hour(s)).   Labs: Basic Metabolic Panel:  Lab 05/24/12 1610 05/23/12 1255 05/22/12 2032  NA 130* 129* 134*  K 4.4 3.9 3.3*  CL 98 96 95*  CO2 25 24 26   GLUCOSE 98 128* 131*  BUN 13 12 15   CREATININE 0.94 0.87 0.98  CALCIUM 9.2 9.3 10.4  MG -- -- --  PHOS -- -- --   Liver Function Tests:  Lab 05/24/12 0505 05/22/12 2032  AST 24 43*  ALT 25 29  ALKPHOS 79 97  BILITOT 0.6 1.1  PROT 6.7 8.2  ALBUMIN 3.6 4.8    Lab 05/22/12 2032  LIPASE 12  AMYLASE --   No results found for this basename: AMMONIA:5 in the last 168 hours CBC:  Lab 05/24/12 0505 05/22/12 2032  WBC 6.6 12.0*  NEUTROABS -- 10.9*  HGB 13.0 15.1  HCT 37.6* 41.5  MCV 93.5 91.4  PLT 152 189   Cardiac Enzymes: No results found for this basename: CKTOTAL:5,CKMB:5,CKMBINDEX:5,TROPONINI:5 in the last 168 hours BNP: BNP (last 3 results) No results found for this basename: PROBNP:3 in the last 8760 hours CBG: No results found for this basename: GLUCAP:5 in the last 168 hours  Active Problems:  Cyclic  vomiting syndrome  Heart murmur  Dehydration, mild   Time coordinating discharge: 35 minutes  Signed:  Lars Mage MD [facility] 05/25/2012, 9:05 AM

## 2012-05-25 NOTE — Progress Notes (Signed)
Met with pt to discuss how to obtain a PCP.

## 2012-07-15 ENCOUNTER — Other Ambulatory Visit: Payer: Self-pay

## 2013-01-06 IMAGING — CR DG ABDOMEN 2V
2 series · 2 of 2 positions shown · non-contrast
Comparison: 08/22/2010

CLINICAL DATA: Abdominal pain, nausea, vomiting.

ABDOMEN - 2 VIEW

[w abdomen upright *]
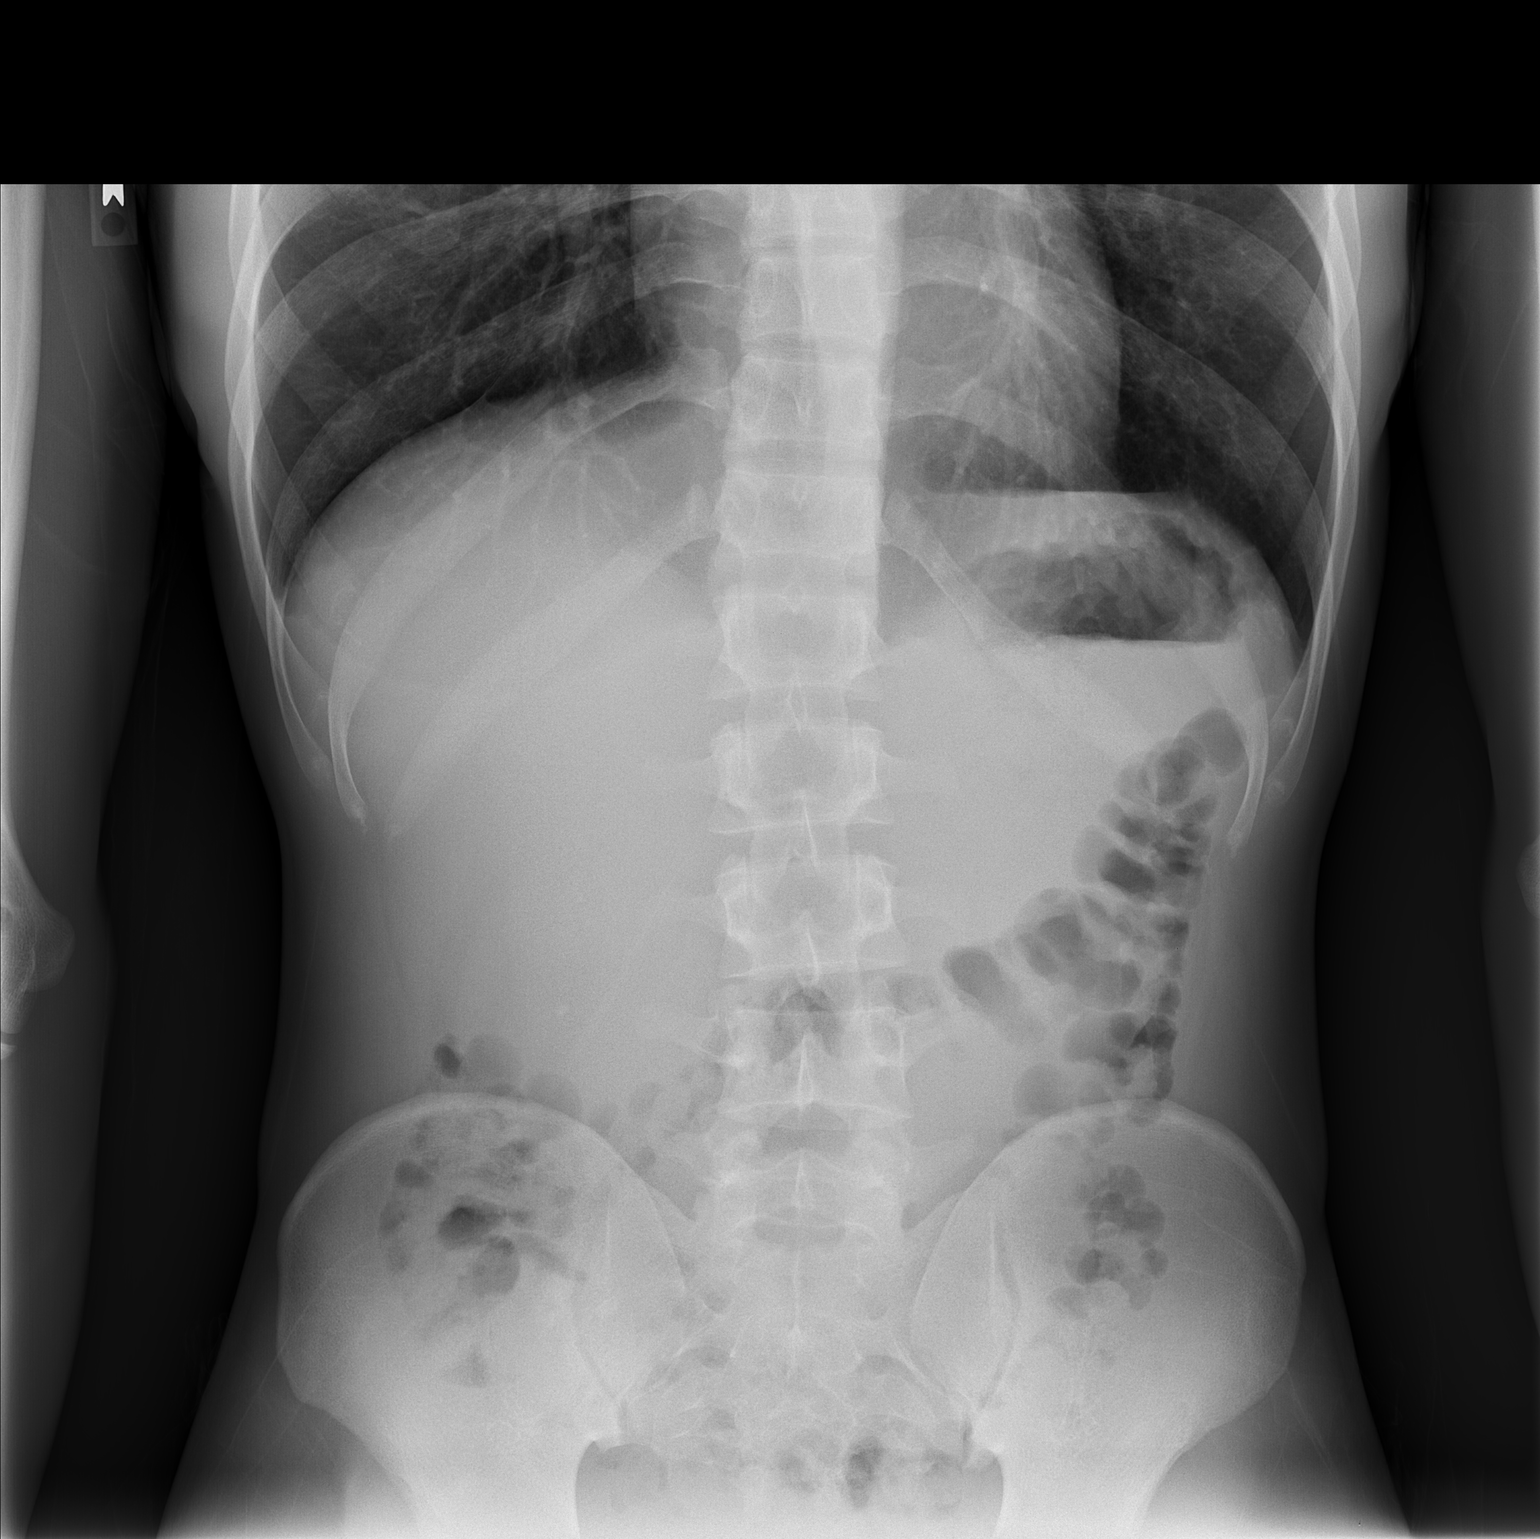

[t abdomen supine]
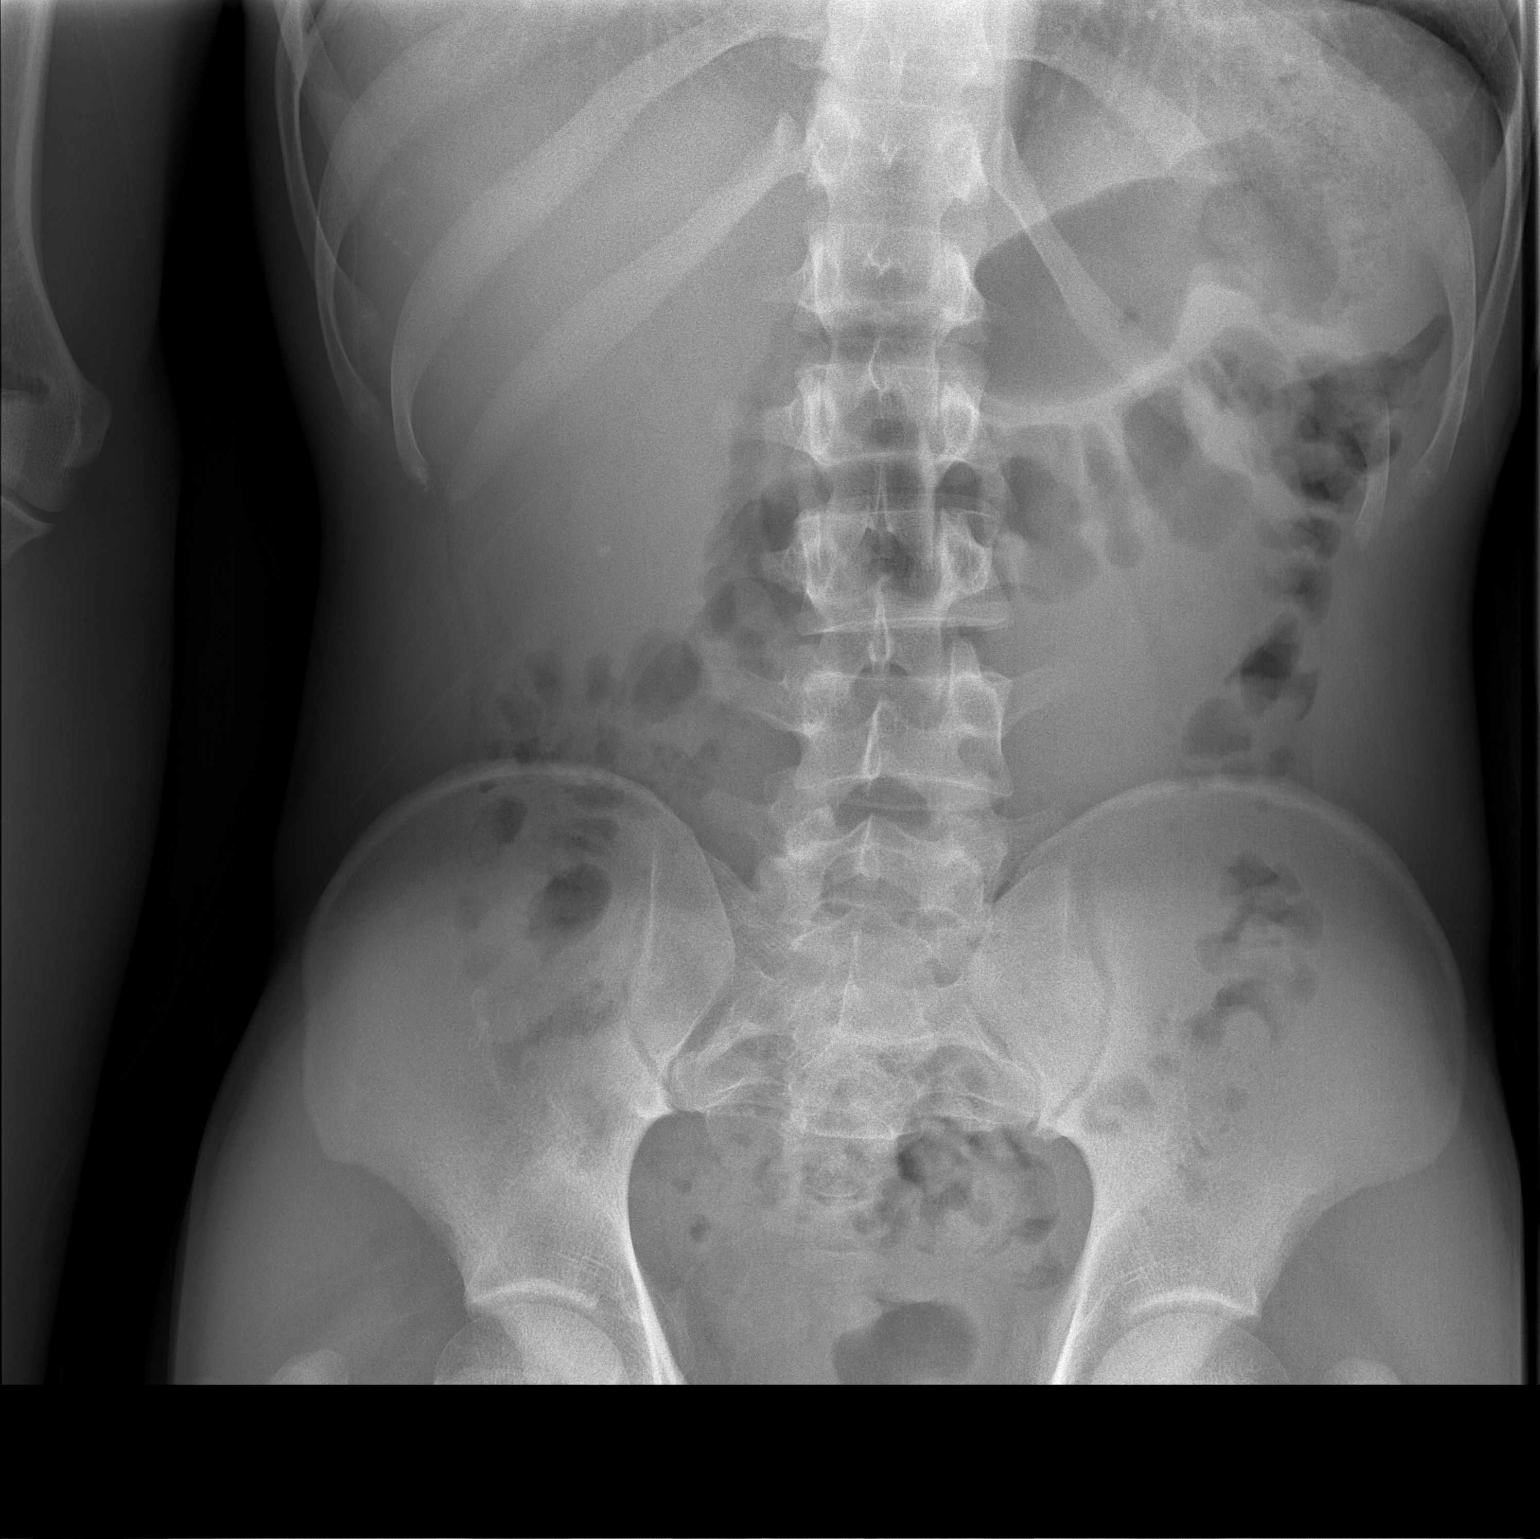

[2 of 2 positions shown; findings below may reference images not displayed]

FINDINGS: Scattered gas and stool in the colon without small or
large bowel distension.  The stomach is not distended.  No free
intra-abdominal air.  No abnormal air fluid levels.  Calcification
projected over the lower pole right kidney is stable.  Bones appear
intact.
IMPRESSION: Nonobstructive bowel gas pattern.  Small calcification in the right
lower kidney.  No change since prior study.

## 2013-04-05 ENCOUNTER — Other Ambulatory Visit: Payer: Self-pay

## 2014-01-11 ENCOUNTER — Encounter: Payer: Self-pay | Admitting: Gastroenterology

## 2014-02-20 DIAGNOSIS — L03119 Cellulitis of unspecified part of limb: Principal | ICD-10-CM

## 2014-02-20 DIAGNOSIS — Z8719 Personal history of other diseases of the digestive system: Secondary | ICD-10-CM | POA: Insufficient documentation

## 2014-02-20 DIAGNOSIS — L02419 Cutaneous abscess of limb, unspecified: Secondary | ICD-10-CM | POA: Insufficient documentation

## 2014-02-20 DIAGNOSIS — Z87891 Personal history of nicotine dependence: Secondary | ICD-10-CM | POA: Insufficient documentation

## 2014-02-20 DIAGNOSIS — Z87442 Personal history of urinary calculi: Secondary | ICD-10-CM | POA: Insufficient documentation

## 2014-02-21 ENCOUNTER — Encounter (HOSPITAL_COMMUNITY): Payer: Self-pay | Admitting: Emergency Medicine

## 2014-02-21 ENCOUNTER — Emergency Department (HOSPITAL_COMMUNITY)
Admission: EM | Admit: 2014-02-21 | Discharge: 2014-02-21 | Disposition: A | Discharge status: Home / Self Care | Payer: 59 | Attending: Emergency Medicine | Admitting: Emergency Medicine

## 2014-02-21 DIAGNOSIS — L02415 Cutaneous abscess of right lower limb: Secondary | ICD-10-CM

## 2014-02-21 MED ORDER — CEPHALEXIN 500 MG PO CAPS: 500.0000 mg | ORAL_CAPSULE | Freq: Four times a day (QID) | ORAL | Status: DC

## 2014-02-21 MED ORDER — LIDOCAINE-EPINEPHRINE (PF) 2 %-1:200000 IJ SOLN
10.0000 mL | Freq: Once | INTRAMUSCULAR | Status: DC
  Filled 2014-02-21: qty 10

## 2014-02-21 MED ORDER — IBUPROFEN 800 MG PO TABS: 800.0000 mg | ORAL_TABLET | Freq: Three times a day (TID) | ORAL | Status: DC

## 2014-02-21 MED ORDER — SULFAMETHOXAZOLE-TRIMETHOPRIM 800-160 MG PO TABS: 1.0000 | ORAL_TABLET | Freq: Two times a day (BID) | ORAL | Status: DC

## 2014-02-21 NOTE — Discharge Instructions (Signed)
Bactrim and Keflex for infection as prescribed. Take ibuprofen for pain. Warm soaks at home, warm compresses. Keep wound clean and dry. Followup as needed. Return if it is worsening.   Abscess An abscess is an infected area that contains a collection of pus and debris.It can occur in almost any part of the body. An abscess is also known as a furuncle or boil. CAUSES  An abscess occurs when tissue gets infected. This can occur from blockage of oil or sweat glands, infection of hair follicles, or a minor injury to the skin. As the body tries to fight the infection, pus collects in the area and creates pressure under the skin. This pressure causes pain. People with weakened immune systems have difficulty fighting infections and get certain abscesses more often.  SYMPTOMS Usually an abscess develops on the skin and becomes a painful mass that is red, warm, and tender. If the abscess forms under the skin, you may feel a moveable soft area under the skin. Some abscesses break open (rupture) on their own, but most will continue to get worse without care. The infection can spread deeper into the body and eventually into the bloodstream, causing you to feel ill.  DIAGNOSIS  Your caregiver will take your medical history and perform a physical exam. A sample of fluid may also be taken from the abscess to determine what is causing your infection. TREATMENT  Your caregiver may prescribe antibiotic medicines to fight the infection. However, taking antibiotics alone usually does not cure an abscess. Your caregiver may need to make a small cut (incision) in the abscess to drain the pus. In some cases, gauze is packed into the abscess to reduce pain and to continue draining the area. HOME CARE INSTRUCTIONS   Only take over-the-counter or prescription medicines for pain, discomfort, or fever as directed by your caregiver.  If you were prescribed antibiotics, take them as directed. Finish them even if you start to  feel better.  If gauze is used, follow your caregiver's directions for changing the gauze.  To avoid spreading the infection:  Keep your draining abscess covered with a bandage.  Wash your hands well.  Do not share personal care items, towels, or whirlpools with others.  Avoid skin contact with others.  Keep your skin and clothes clean around the abscess.  Keep all follow-up appointments as directed by your caregiver. SEEK MEDICAL CARE IF:   You have increased pain, swelling, redness, fluid drainage, or bleeding.  You have muscle aches, chills, or a general ill feeling.  You have a fever. MAKE SURE YOU:   Understand these instructions.  Will watch your condition.  Will get help right away if you are not doing well or get worse. Document Released: 02/24/2005 Document Revised: 11/16/2011 Document Reviewed: 07/30/2011 [facility] Patient Information 2015 [employer], [place]. This information is not intended to replace advice given to you by your health care provider. Make sure you discuss any questions you have with your health care provider.

## 2014-02-21 NOTE — ED Provider Notes (Signed)
Medical screening examination/treatment/procedure(s) were performed by non-physician practitioner and as supervising physician I was immediately available for consultation/collaboration.   EKG Interpretation None        Anay Walter, MD 02/21/14 0712 

## 2014-02-21 NOTE — ED Notes (Signed)
Pt states he has a place on the outside of his right ankle that has been bothering him for the past 2 to 3 weeks  Pt states he had another place like it but it healed up   Pt states he has been cleaning it and squeezing it but it wont get better

## 2014-02-21 NOTE — ED Provider Notes (Signed)
CSN: [id number]     Arrival date & time 02/20/14  2319 History   First MD Initiated Contact with Patient 02/21/14 0038     Chief Complaint  Patient presents with  . Abscess     (Consider location/radiation/quality/duration/timing/severity/associated sxs/prior Treatment) HPI Erik Cowan is a 28 y.o. male who presents to emergency department complaining of swelling and pain to the right lower leg. Patient states he started out as a temple to the right lower leg, states he thought he got bit by a spider. He states he squeezed it and got pus out. Since then it's been getting getting larger, more tender. States it is still draining. He denies any fever chills. He denies any prior history of the same. He states the reason he came to emergency department is because now his, redness and swelling over his lower leg. He did not try any medications for this. He did not try any wound care or soaks. He is otherwise healthy.   Past Medical History  Diagnosis Date  . GERD (gastroesophageal reflux disease)   . Nephrolithiasis   . Cyclic vomiting syndrome    Past Surgical History  Procedure Laterality Date  . Unremarkable    . Appendectomy     Family History  Problem Relation Age of Onset  . Colon cancer Maternal Uncle   . Liver cancer Mother   . Diabetes Maternal Aunt   . Heart disease Paternal Grandfather   . Testicular cancer Brother    History  Substance Use Topics  . Smoking status: Former Smoker -- 1 years    Types: Cigarettes    Quit date: 06/04/2007  . Smokeless tobacco: Never Used     Comment: see illicit drug comment  . Alcohol Use: Yes     Comment: occasional-once per month    Review of Systems  Constitutional: Negative for fever and chills.  Respiratory: Negative for cough, chest tightness and shortness of breath.   Cardiovascular: Negative for chest pain, palpitations and leg swelling.  Genitourinary: Negative for dysuria, urgency, frequency and hematuria.   Musculoskeletal: Positive for myalgias. Negative for neck pain and neck stiffness.  Skin: Positive for color change and wound. Negative for rash.  Allergic/Immunologic: Negative for immunocompromised state.  Neurological: Negative for dizziness, weakness, light-headedness, numbness and headaches.      Allergies  Review of patient's allergies indicates no known allergies.  Home Medications   Prior to Admission medications   Not on File   BP 127/68  Pulse 60  Temp(Src) 98 F (36.7 C) (Oral)  Resp 18  SpO2 99% Physical Exam  Nursing note and vitals reviewed. Constitutional: He appears well-developed and well-nourished. No distress.  HENT:  Head: Normocephalic.  Neck: Normal range of motion. Neck supple.  Cardiovascular: Normal rate, regular rhythm and normal heart sounds.   Pulmonary/Chest: Effort normal and breath sounds normal. No respiratory distress. He has no wheezes. He has no rales.  Abdominal: Soft. There is no tenderness.  Skin: Skin is warm and dry.  Abscess to the right lateral lower leg. Mild surrounding erythema. Tenderness to palpation. Some purulent drainage. Wound is fluctuant.    ED Course  Procedures (including critical care time) Labs Review Labs Reviewed - No data to display  Imaging Review No results found.   EKG Interpretation None      INCISION AND DRAINAGE Performed by: Jaynie Crumble A Consent: Verbal consent obtained. Risks and benefits: risks, benefits and alternatives were discussed Type: abscess  Body area: right lower leg  Anesthesia: local infiltration  Incision was made with a scalpel.  Local anesthetic: lidocaine 2% w epinephrine  Anesthetic total: 4 ml  Complexity: complex Blunt dissection to break up loculations  Drainage: purulent  Drainage amount: large  Packing material: no packing  Patient tolerance: Patient tolerated the procedure well with no immediate complications.     MDM   Final diagnoses:   Abscess of right lower leg    Patient with draining abscess with mild surrounding cellulitis to the right lower leg. Incised and drained and got a lot more purulent drainage out. Did not pack. We'll discharge home with Keflex, Bactrim, followup as needed. He is afebrile, otherwise nontoxic appearing. No signs of systemic infection.  Filed Vitals:   02/20/14 2337  BP: 127/68  Pulse: 60  Temp: 98 F (36.7 C)  TempSrc: Oral  Resp: 18  SpO2: 99%       Lottie Mussel, PA-C 02/21/14 [phone]

## 2014-02-25 ENCOUNTER — Emergency Department (HOSPITAL_COMMUNITY)
Admission: EM | Admit: 2014-02-25 | Discharge: 2014-02-26 | Disposition: A | Discharge status: Home / Self Care | Payer: 59 | Attending: Emergency Medicine | Admitting: Emergency Medicine

## 2014-02-25 ENCOUNTER — Encounter (HOSPITAL_COMMUNITY): Payer: Self-pay | Admitting: Emergency Medicine

## 2014-02-25 DIAGNOSIS — R112 Nausea with vomiting, unspecified: Secondary | ICD-10-CM

## 2014-02-25 DIAGNOSIS — R630 Anorexia: Secondary | ICD-10-CM | POA: Insufficient documentation

## 2014-02-25 DIAGNOSIS — Z87442 Personal history of urinary calculi: Secondary | ICD-10-CM | POA: Insufficient documentation

## 2014-02-25 DIAGNOSIS — Z8719 Personal history of other diseases of the digestive system: Secondary | ICD-10-CM | POA: Insufficient documentation

## 2014-02-25 DIAGNOSIS — Z792 Long term (current) use of antibiotics: Secondary | ICD-10-CM | POA: Insufficient documentation

## 2014-02-25 DIAGNOSIS — Z791 Long term (current) use of non-steroidal anti-inflammatories (NSAID): Secondary | ICD-10-CM | POA: Insufficient documentation

## 2014-02-25 DIAGNOSIS — R1084 Generalized abdominal pain: Secondary | ICD-10-CM | POA: Insufficient documentation

## 2014-02-25 DIAGNOSIS — R1013 Epigastric pain: Secondary | ICD-10-CM | POA: Insufficient documentation

## 2014-02-25 DIAGNOSIS — Z87891 Personal history of nicotine dependence: Secondary | ICD-10-CM | POA: Insufficient documentation

## 2014-02-25 DIAGNOSIS — R197 Diarrhea, unspecified: Secondary | ICD-10-CM | POA: Insufficient documentation

## 2014-02-25 DIAGNOSIS — R109 Unspecified abdominal pain: Secondary | ICD-10-CM | POA: Insufficient documentation

## 2014-02-25 LAB — BASIC METABOLIC PANEL
ANION GAP: 13 (ref 5–15)
BUN: 18 mg/dL (ref 6–23)
CALCIUM: 9.4 mg/dL (ref 8.4–10.5)
CO2: 26 meq/L (ref 19–32)
CREATININE: 0.98 mg/dL (ref 0.50–1.35)
Chloride: 97 mEq/L (ref 96–112)
GFR calc non Af Amer: >90 mL/min (ref >90)
Glucose, Bld: 120 mg/dL — ABNORMAL HIGH (ref 70–99)
Potassium: 4 mEq/L (ref 3.7–5.3)
SODIUM: 136 meq/L — AB (ref 137–147)

## 2014-02-25 LAB — CBC
HCT: 36.9 % — ABNORMAL LOW (ref 39.0–52.0)
Hemoglobin: 13.2 g/dL (ref 13.0–17.0)
MCH: 33.1 pg (ref 26.0–34.0)
MCHC: 35.8 g/dL (ref 30.0–36.0)
MCV: 92.5 fL (ref 78.0–100.0)
PLATELETS: 186 10*3/uL (ref 150–400)
RBC: 3.99 MIL/uL — AB (ref 4.22–5.81)
RDW: 11.7 % (ref 11.5–15.5)
WBC: 5 10*3/uL (ref 4.0–10.5)

## 2014-02-25 MED ORDER — ONDANSETRON HCL 4 MG/2ML IJ SOLN: 4.0000 mg | Freq: Once | INTRAMUSCULAR | Status: DC

## 2014-02-25 MED ORDER — SODIUM CHLORIDE 0.9 % IV BOLUS (SEPSIS)
1000.0000 mL | Freq: Once | INTRAVENOUS | Status: AC
  Administered 2014-02-25: 1000 mL via INTRAVENOUS

## 2014-02-25 MED ORDER — METOCLOPRAMIDE HCL 5 MG/ML IJ SOLN
10.0000 mg | Freq: Once | INTRAMUSCULAR | Status: AC
  Administered 2014-02-25: 10 mg via INTRAVENOUS
  Filled 2014-02-25: qty 2

## 2014-02-25 MED ORDER — KETOROLAC TROMETHAMINE 15 MG/ML IJ SOLN
15.0000 mg | Freq: Once | INTRAMUSCULAR | Status: AC
  Administered 2014-02-25: 15 mg via INTRAVENOUS
  Filled 2014-02-25: qty 1

## 2014-02-25 MED ORDER — ONDANSETRON 4 MG PO TBDP
8.0000 mg | ORAL_TABLET | Freq: Once | ORAL | Status: AC
  Administered 2014-02-25: 8 mg via ORAL
  Filled 2014-02-25: qty 2

## 2014-02-25 NOTE — ED Notes (Signed)
Pt c/o lower, sharp abdominal pain starting today. Reports NVD has been for "few days." Denies urinary issues. LLQ tender on palpation. Pt prescribed antibiotics for insect bite, denies taking medications.

## 2014-02-25 NOTE — ED Notes (Signed)
Pt reports waking up this AM with severe lower abdominal pain- admits to nausea, vomiting, and diarrhea associated with symptoms.  Pt was seen for insect bite last week and prescribed antibiotics, pt has not started taking them yet.  Denies urinary symptoms.

## 2014-02-26 LAB — HEPATIC FUNCTION PANEL
ALT: 13 U/L (ref 0–53)
AST: 23 U/L (ref 0–37)
Albumin: 4.2 g/dL (ref 3.5–5.2)
Alkaline Phosphatase: 83 U/L (ref 39–117)
BILIRUBIN TOTAL: 0.3 mg/dL (ref 0.3–1.2)
Total Protein: 8 g/dL (ref 6.0–8.3)

## 2014-02-26 LAB — LIPASE, BLOOD: LIPASE: 35 U/L (ref 11–59)

## 2014-02-26 MED ORDER — SODIUM CHLORIDE 0.9 % IV SOLN
Freq: Once | INTRAVENOUS | Status: AC
  Administered 2014-02-26: 01:00:00 via INTRAVENOUS

## 2014-02-26 MED ORDER — FENTANYL CITRATE 0.05 MG/ML IJ SOLN
75.0000 ug | Freq: Once | INTRAMUSCULAR | Status: AC
  Administered 2014-02-26: 75 ug via INTRAVENOUS
  Filled 2014-02-26: qty 2

## 2014-02-26 MED ORDER — ONDANSETRON HCL 4 MG/2ML IJ SOLN
4.0000 mg | Freq: Once | INTRAMUSCULAR | Status: AC
  Administered 2014-02-26: 4 mg via INTRAVENOUS
  Filled 2014-02-26: qty 2

## 2014-02-26 MED ORDER — NAPROXEN 500 MG PO TABS: 500.0000 mg | ORAL_TABLET | Freq: Three times a day (TID) | ORAL | Status: DC | PRN

## 2014-02-26 MED ORDER — METOCLOPRAMIDE HCL 10 MG PO TABS: 10.0000 mg | ORAL_TABLET | Freq: Four times a day (QID) | ORAL | Status: DC | PRN

## 2014-02-26 NOTE — ED Provider Notes (Signed)
CSN: [id number]     Arrival date & time 02/25/14  1918 History   First MD Initiated Contact with Patient 02/25/14 2302     Chief Complaint  Patient presents with  . Abdominal Pain     (Consider location/radiation/quality/duration/timing/severity/associated sxs/prior Treatment) HPI Comments: 28 year old male with reflux, kidney stone, cyclical vomiting, past smoker, mild alcohol use presents with recurrent vomiting and diarrhea and abdominal cramping for the past few days. Similar previous episodes. Patient denies sick contacts or new foods. Nonradiating cramping. Patient not tolerating oral fluids. Nothing improves his symptoms. No travel outside the country.  Patient is a 28 y.o. male presenting with abdominal pain. The history is provided by the patient.  Abdominal Pain Associated symptoms: diarrhea, nausea and vomiting   Associated symptoms: no chest pain, no chills, no dysuria, no fever and no shortness of breath     Past Medical History  Diagnosis Date  . GERD (gastroesophageal reflux disease)   . Nephrolithiasis   . Cyclic vomiting syndrome    Past Surgical History  Procedure Laterality Date  . Unremarkable    . Appendectomy     Family History  Problem Relation Age of Onset  . Colon cancer Maternal Uncle   . Liver cancer Mother   . Diabetes Maternal Aunt   . Heart disease Paternal Grandfather   . Testicular cancer Brother    History  Substance Use Topics  . Smoking status: Former Smoker -- 1 years    Types: Cigarettes    Quit date: 06/04/2007  . Smokeless tobacco: Never Used     Comment: see illicit drug comment  . Alcohol Use: Yes     Comment: occasional-once per month    Review of Systems  Constitutional: Positive for appetite change. Negative for fever and chills.  HENT: Negative for congestion.   Eyes: Negative for visual disturbance.  Respiratory: Negative for shortness of breath.   Cardiovascular: Negative for chest pain.  Gastrointestinal: Positive  for nausea, vomiting, abdominal pain and diarrhea. Negative for blood in stool.  Genitourinary: Negative for dysuria and flank pain.  Musculoskeletal: Negative for back pain, neck pain and neck stiffness.  Skin: Negative for rash.  Neurological: Negative for light-headedness and headaches.      Allergies  Review of patient's allergies indicates no known allergies.  Home Medications   Prior to Admission medications   Medication Sig Start Date End Date Taking? Authorizing Provider  cephALEXin (KEFLEX) 500 MG capsule Take 1 capsule (500 mg total) by mouth 4 (four) times daily. 02/21/14   Tatyana A Kirichenko, PA-C  ibuprofen (ADVIL,MOTRIN) 800 MG tablet Take 1 tablet (800 mg total) by mouth 3 (three) times daily. 02/21/14   Tatyana A Kirichenko, PA-C  sulfamethoxazole-trimethoprim (SEPTRA DS) 800-160 MG per tablet Take 1 tablet by mouth every 12 (twelve) hours. 02/21/14   Tatyana A Kirichenko, PA-C   BP 152/97  Pulse 50  Temp(Src) 97.5 F (36.4 C) (Oral)  Resp 16  Ht 6\' 2"  (1.88 m)  Wt 154 lb 8 oz (70.081 kg)  BMI 19.83 kg/m2  SpO2 99% Physical Exam  Nursing note and vitals reviewed. Constitutional: He is oriented to person, place, and time. He appears well-developed and well-nourished.  HENT:  Head: Normocephalic and atraumatic.  Mild dry mucous membranes  Eyes: Conjunctivae are normal. Right eye exhibits no discharge. Left eye exhibits no discharge.  Neck: Normal range of motion. Neck supple. No tracheal deviation present.  Cardiovascular: Normal rate and regular rhythm.   Pulmonary/Chest: Effort normal and  breath sounds normal.  Abdominal: Soft. He exhibits no distension. There is tenderness (mild diffuse worse epigastric). There is no guarding.  Musculoskeletal: He exhibits no edema and no tenderness.  Neurological: He is alert and oriented to person, place, and time.  Skin: Skin is warm. No rash noted.  Psychiatric: He has a normal mood and affect.    ED Course   Procedures (including critical care time) Labs Review Labs Reviewed  CBC - Abnormal; Notable for the following:    RBC 3.99 (*)    HCT 36.9 (*)    All other components within normal limits  BASIC METABOLIC PANEL - Abnormal; Notable for the following:    Sodium 136 (*)    Glucose, Bld 120 (*)    All other components within normal limits  LIPASE, BLOOD  HEPATIC FUNCTION PANEL    Imaging Review No results found.   EKG Interpretation None      MDM   Final diagnoses:  Abdominal cramping  Nausea vomiting and diarrhea   Well-appearing male with cyclical vomiting history presents with symptoms of gastroenteritis versus cyclical vomiting versus less likely ulcer or biliary. Nonfocal abdominal exam no peritonitis. Plan for IV fluids, pain meds and nausea meds. Blood work reviewed unremarkable.  Patient recurrent vomiting in the ER requiring multiple fluid boluses and multiple antiemetics and pain meds. Patient did improve on recheck. Patient does use marijuana frequently and has had cyclical vomiting in the past. He used today. Discussed avoiding drugs.  2 L fluid bolus in ER. Results and differential diagnosis were discussed with the patient/parent/guardian. Close follow up outpatient was discussed, comfortable with the plan.   Medications  ondansetron (ZOFRAN-ODT) disintegrating tablet 8 mg (8 mg Oral Given 02/25/14 2237)  sodium chloride 0.9 % bolus 1,000 mL (0 mLs Intravenous Stopped 02/26/14 0108)  metoCLOPramide (REGLAN) injection 10 mg (10 mg Intravenous Given 02/25/14 2354)  ketorolac (TORADOL) 15 MG/ML injection 15 mg (15 mg Intravenous Given 02/25/14 2353)  0.9 %  sodium chloride infusion ( Intravenous Stopped 02/26/14 0213)  ondansetron (ZOFRAN) injection 4 mg (4 mg Intravenous Given 02/26/14 0107)  fentaNYL (SUBLIMAZE) injection 75 mcg (75 mcg Intravenous Given 02/26/14 0107)    Filed Vitals:   02/25/14 1922 02/25/14 2301 02/25/14 2356  BP: 124/78 155/79 152/97  Pulse:  69 50 50  Temp: 98.6 F (37 C) 97.5 F (36.4 C)   TempSrc:  Oral   Resp: 18 18 16   Height: 6\' 2"  (1.88 m)    Weight: 154 lb 8 oz (70.081 kg)    SpO2: 95% 100% 99%    Final diagnoses:  Abdominal cramping  Nausea vomiting and diarrhea        Enid SkeensJoshua M Katelyn Broadnax, MD 02/26/14 [id number]

## 2014-02-26 NOTE — Discharge Instructions (Signed)
If you were given medicines take as directed.  If you are on coumadin or contraceptives realize their levels and effectiveness is altered by many different medicines.  If you have any reaction (rash, tongues swelling, other) to the medicines stop taking and see a physician.   Please follow up as directed and return to the ER or see a physician for new or worsening symptoms.  Thank you. Filed Vitals:   02/25/14 1922 02/25/14 2301 02/25/14 2356  BP: 124/78 155/79 152/97  Pulse: 69 50 50  Temp: 98.6 F (37 C) 97.5 F (36.4 C)   TempSrc:  Oral   Resp: 18 18 16   Height: 6\' 2"  (1.88 m)    Weight: 154 lb 8 oz (70.081 kg)    SpO2: 95% 100% 99%

## 2014-02-26 NOTE — ED Notes (Signed)
Dr Zavitz at bedside  

## 2014-02-28 ENCOUNTER — Encounter (HOSPITAL_COMMUNITY): Payer: Self-pay | Admitting: Emergency Medicine

## 2014-02-28 ENCOUNTER — Emergency Department (HOSPITAL_COMMUNITY)
Admission: EM | Admit: 2014-02-28 | Discharge: 2014-02-28 | Disposition: A | Discharge status: Home / Self Care | Payer: Commercial Managed Care - PPO | Attending: Emergency Medicine | Admitting: Emergency Medicine

## 2014-02-28 DIAGNOSIS — Z792 Long term (current) use of antibiotics: Secondary | ICD-10-CM | POA: Diagnosis not present

## 2014-02-28 DIAGNOSIS — Z87891 Personal history of nicotine dependence: Secondary | ICD-10-CM | POA: Diagnosis not present

## 2014-02-28 DIAGNOSIS — F1210 Cannabis abuse, uncomplicated: Secondary | ICD-10-CM

## 2014-02-28 DIAGNOSIS — Z791 Long term (current) use of non-steroidal anti-inflammatories (NSAID): Secondary | ICD-10-CM | POA: Diagnosis not present

## 2014-02-28 DIAGNOSIS — Z87442 Personal history of urinary calculi: Secondary | ICD-10-CM | POA: Insufficient documentation

## 2014-02-28 DIAGNOSIS — Z8719 Personal history of other diseases of the digestive system: Secondary | ICD-10-CM | POA: Diagnosis not present

## 2014-02-28 DIAGNOSIS — R1013 Epigastric pain: Secondary | ICD-10-CM | POA: Diagnosis present

## 2014-02-28 DIAGNOSIS — G43A1 Cyclical vomiting, intractable: Secondary | ICD-10-CM | POA: Diagnosis not present

## 2014-02-28 DIAGNOSIS — R1110 Vomiting, unspecified: Secondary | ICD-10-CM

## 2014-02-28 LAB — LIPASE, BLOOD: Lipase: 13 U/L (ref 11–59)

## 2014-02-28 LAB — CBC WITH DIFFERENTIAL/PLATELET
BASOS PCT: 0 % (ref 0–1)
Basophils Absolute: 0 10*3/uL (ref 0.0–0.1)
Eosinophils Absolute: 0 10*3/uL (ref 0.0–0.7)
Eosinophils Relative: 0 % (ref 0–5)
HEMATOCRIT: 40.8 % (ref 39.0–52.0)
Hemoglobin: 14.4 g/dL (ref 13.0–17.0)
Lymphocytes Relative: 23 % (ref 12–46)
Lymphs Abs: 1.7 10*3/uL (ref 0.7–4.0)
MCH: 32.2 pg (ref 26.0–34.0)
MCHC: 35.3 g/dL (ref 30.0–36.0)
MCV: 91.3 fL (ref 78.0–100.0)
MONO ABS: 0.7 10*3/uL (ref 0.1–1.0)
MONOS PCT: 9 % (ref 3–12)
Neutro Abs: 5 10*3/uL (ref 1.7–7.7)
Neutrophils Relative %: 68 % (ref 43–77)
Platelets: 207 10*3/uL (ref 150–400)
RBC: 4.47 MIL/uL (ref 4.22–5.81)
RDW: 11.6 % (ref 11.5–15.5)
WBC: 7.4 10*3/uL (ref 4.0–10.5)

## 2014-02-28 LAB — COMPREHENSIVE METABOLIC PANEL
ALK PHOS: 82 U/L (ref 39–117)
ALT: 20 U/L (ref 0–53)
AST: 26 U/L (ref 0–37)
Albumin: 4.7 g/dL (ref 3.5–5.2)
Anion gap: 14 (ref 5–15)
BUN: 25 mg/dL — ABNORMAL HIGH (ref 6–23)
CO2: 27 mEq/L (ref 19–32)
Calcium: 10 mg/dL (ref 8.4–10.5)
Chloride: 93 mEq/L — ABNORMAL LOW (ref 96–112)
Creatinine, Ser: 1.01 mg/dL (ref 0.50–1.35)
GFR calc Af Amer: >90 mL/min (ref >90)
GFR calc non Af Amer: >90 mL/min (ref >90)
Glucose, Bld: 99 mg/dL (ref 70–99)
Potassium: 4.1 mEq/L (ref 3.7–5.3)
Sodium: 134 mEq/L — ABNORMAL LOW (ref 137–147)
TOTAL PROTEIN: 8.5 g/dL — AB (ref 6.0–8.3)
Total Bilirubin: 0.9 mg/dL (ref 0.3–1.2)

## 2014-02-28 MED ORDER — GI COCKTAIL ~~LOC~~
30.0000 mL | Freq: Once | ORAL | Status: AC
  Administered 2014-02-28: 30 mL via ORAL
  Filled 2014-02-28: qty 30

## 2014-02-28 MED ORDER — SODIUM CHLORIDE 0.9 % IV BOLUS (SEPSIS)
1000.0000 mL | Freq: Once | INTRAVENOUS | Status: AC
  Administered 2014-02-28: 1000 mL via INTRAVENOUS

## 2014-02-28 MED ORDER — ONDANSETRON HCL 4 MG/2ML IJ SOLN
4.0000 mg | Freq: Once | INTRAMUSCULAR | Status: AC
  Administered 2014-02-28: 4 mg via INTRAVENOUS
  Filled 2014-02-28: qty 2

## 2014-02-28 MED ORDER — PROMETHAZINE HCL 25 MG PO TABS: 25.0000 mg | ORAL_TABLET | Freq: Four times a day (QID) | ORAL | Status: DC | PRN

## 2014-02-28 MED ORDER — FENTANYL CITRATE 0.05 MG/ML IJ SOLN
75.0000 ug | Freq: Once | INTRAMUSCULAR | Status: AC
  Administered 2014-02-28: 75 ug via INTRAVENOUS
  Filled 2014-02-28: qty 2

## 2014-02-28 MED ORDER — PROMETHAZINE HCL 25 MG/ML IJ SOLN
12.5000 mg | Freq: Once | INTRAMUSCULAR | Status: AC
  Administered 2014-02-28: 12.5 mg via INTRAVENOUS
  Filled 2014-02-28: qty 1

## 2014-02-28 MED ORDER — KETOROLAC TROMETHAMINE 15 MG/ML IJ SOLN
15.0000 mg | Freq: Once | INTRAMUSCULAR | Status: AC
  Administered 2014-02-28: 15 mg via INTRAVENOUS
  Filled 2014-02-28: qty 1

## 2014-02-28 MED ORDER — METOCLOPRAMIDE HCL 5 MG/ML IJ SOLN
10.0000 mg | Freq: Once | INTRAMUSCULAR | Status: AC
  Administered 2014-02-28: 10 mg via INTRAVENOUS
  Filled 2014-02-28: qty 2

## 2014-02-28 MED ORDER — HYDROMORPHONE HCL 1 MG/ML IJ SOLN
0.5000 mg | Freq: Once | INTRAMUSCULAR | Status: AC
  Administered 2014-02-28: 0.5 mg via INTRAVENOUS
  Filled 2014-02-28: qty 1

## 2014-02-28 MED ORDER — ONDANSETRON HCL 4 MG PO TABS: 4.0000 mg | ORAL_TABLET | Freq: Three times a day (TID) | ORAL | Status: DC | PRN

## 2014-02-28 NOTE — Discharge Planning (Signed)
[facility] Community [facility] & Eligibility Specialist ° °Spoke to patient regarding primary care resources and establishing care with a provider. Resource guide and my contact information provided for any future questions or concerns. No other Community Health & Eligibility Specialist needs identified at this time. °

## 2014-02-28 NOTE — Discharge Instructions (Signed)
Cyclic Vomiting Syndrome °Cyclic vomiting syndrome is a benign condition in which patients experience bouts or cycles of severe nausea and vomiting that last for hours or even days. The bouts of nausea and vomiting alternate with longer periods of no symptoms and generally good health. Cyclic vomiting syndrome occurs mostly in children, but can affect adults. °CAUSES  °CV[facility] has no known cause. Each episode is typically similar to the previous ones. The episodes tend to:  °· Start at about the same time of day. °· Last the same length of time. °· Present the same symptoms at the same level of intensity. °Cyclic vomiting syndrome can begin at any age in children and adults. Cyclic vomiting syndrome usually starts between the ages of 3 and 7 years. In adults, episodes tend to occur less often than they do in children, but they last longer. Furthermore, the events or situations that trigger episodes in adults cannot always be pinpointed as easily as they can in children. °There are 4 phases of cyclic vomiting syndrome: °1. Prodrome. The prodrome phase signals that an episode of nausea and vomiting is about to begin. This phase can last from just a few minutes to several hours. This phase is often marked by belly (abdominal) pain. Sometimes taking medicine early in the prodrome phase can stop an episode in progress. However, sometimes there is no warning. A person may simply wake up in the middle of the night or early morning and begin vomiting. °2. Episode. The episode phase consists of: °· Severe vomiting. °· Nausea. °· Gagging (retching). °3. Recovery. The recovery phase begins when the nausea and vomiting stop. Healthy color, appetite, and energy return. °4. Symptom-free interval. The symptom-free interval phase is the period between episodes when no symptoms are present. °TRIGGERS °Episodes can be triggered by an infection or event. Examples of triggers include: °· Infections. °· Colds, allergies, sinus problems, and  the flu. °· Eating certain foods such as chocolate or cheese. °· Foods with monosodium glutamate (MSG) or preservatives. °· Fast foods. °· Pre-packaged foods. °· Foods with low nutritional value (junk foods). °· Overeating. °· Eating just before going to bed. °· Hot weather. °· Dehydration. °· Not enough sleep or poor sleep quality. °· Physical exhaustion. °· Menstruation. °· Motion sickness. °· Emotional stress (school or home difficulties). °· Excitement or stress. °SYMPTOMS  °The main symptoms of cyclic vomiting syndrome are: °· Severe vomiting. °· Nausea. °· Gagging (retching). °Episodes usually begin at night or the first thing in the morning. Episodes may include vomiting or retching up to 5 or 6 times an hour during the worst of the episode. Episodes usually last anywhere from 1 to 4 days. Episodes can last for up to 10 days. Other symptoms include: °· Paleness. °· Exhaustion. °· Listlessness. °· Abdominal pain. °· Loose stools or diarrhea. °Sometimes the nausea and vomiting are so severe that a person appears to be almost unconscious. Sensitivity to light, headache, fever, dizziness, may also accompany an episode. In addition, the vomiting may cause drooling and excessive thirst. Drinking water usually leads to more vomiting, though the water can dilute the acid in the vomit, making the episode a little less painful. Continuous vomiting can lead to dehydration, which means that the body has lost excessive water and salts. °DIAGNOSIS  °Cyclic vomiting syndrome is hard to diagnose because there are no clear tests to identify it. A caregiver must diagnose cyclic vomiting syndrome by looking at symptoms and medical history. A caregiver must exclude more common diseases   or disorders that can also cause nausea and vomiting. Also, diagnosis takes time because caregivers need to identify a pattern or cycle to the vomiting. TREATMENT  Cyclic vomiting syndrome cannot be cured. Treatment varies, but people with  cyclic vomiting syndrome should get plenty of rest and sleep and take medications that prevent, stop, or lessen the vomiting episodes and other symptoms. People whose episodes are frequent and long-lasting may be treated during the symptom-free intervals in an effort to prevent or ease future episodes. The symptom-free phase is a good time to eliminate anything known to trigger an episode. For example, if episodes are brought on by stress or excitement, this period is the time to find ways to reduce stress and stay calm. If sinus problems or allergies cause episodes, those conditions should be treated. The triggers listed above should be avoided or prevented. Because of the similarities between migraine and cyclic vomiting syndrome, caregivers treat some people with severe cyclic vomiting syndrome with drugs that are also used for migraine headaches. The drugs are designed to:  Prevent episodes.  Reduce their frequency.  Lessen their severity. HOME CARE INSTRUCTIONS Once a vomiting episode begins, treatment is supportive. It helps to stay in bed and sleep in a dark, quiet room. Severe nausea and vomiting may require hospitalization and intravenous (IV) fluids to prevent dehydration. Relaxing medications (sedatives) may help if the nausea continues. Sometimes, during the prodrome phase, it is possible to stop an episode from happening altogether. Only take over-the-counter or prescription medicines for pain, discomfort or fever as directed by your caregiver. Do not give aspirin to children. During the recovery phase, drinking water and replacing lost electrolytes (salts in the blood) are very important. Electrolytes are salts that the body needs to function well and stay healthy. Symptoms during the recovery phase can vary. Some people find that their appetites return to normal immediately, while others need to begin by drinking clear liquids and then move slowly to solid food. RELATED COMPLICATIONS The  severe vomiting that defines cyclic vomiting syndrome is a risk factor for several complications:  Dehydration--Vomiting causes the body to lose water quickly.  Electrolyte imbalance--Vomiting also causes the body to lose the important salts it needs to keep working properly.  Peptic esophagitis--The tube that connects the mouth to the stomach (esophagus) becomes injured from the stomach acid that comes up with the vomit.  Hematemesis--The esophagus becomes irritated and bleeds, so blood mixes with the vomit.  Mallory-Weiss tear--The lower end of the esophagus may tear open or the stomach may bruise from vomiting or retching.  Tooth decay--The acid in the vomit can hurt the teeth by corroding the tooth enamel. SEEK MEDICAL CARE IF: You have questions or problems. Document Released: 07/26/2001 Document Revised: 08/09/2011 Document Reviewed: 08/24/2010 [facility] Patient Information 2015 [place], [place]. This information is not intended to replace advice given to you by your health care provider. Make sure you discuss any questions you have with your health care provider.  Marijuana Abuse Your exam shows you have used marijuana or pot. There are many health problems related to marijuana abuse. These include:  Bronchitis.  Chronic cough.  Emphysema.  Lung and upper airway cancer. Abusers also experience impairment in:  Memory.  Judgment.  Ability to learn.  Coordination. Students who smoke marijuana:  Get lower grades.  Are less likely to graduate than those who do not. Adults who abuse marijuana:  Have problems at work.  May even lose their jobs due to:  Poor work International aid/development workerperformance.  Absenteeism. Attention, memory, and learning skills have been shown to be diminished for up to 6 months after stopping regular use, and there is evidence that the effects can be cumulative over a lifetime.  Heavier use of marijuana also puts a strain on relationships with friends and loved ones  and can lead to moodiness and loss of confidence. Acute intoxication can lead to:  Increased anxiety.  A panic episode. It also increases the risk for having an automobile accident. This is especially true if the pot is combined with alcohol or other intoxicants. Treatment for acute intoxication is rarely needed. However, medicine to reduce anxiety may be helpful in some people. Millions of people are considered to be dependent on marijuana. It is long-term regular use that leads to addiction and all of its complex problems. Information on the problem of addiction and the health problems of long-term abuse is posted at the [facility] for Drug Abuse website, http://www.price-smith.com/. Consult with your doctor or counselor if you want further information and support in handling this common problem. Document Released: 06/24/2004 Document Revised: 08/09/2011 Document Reviewed: 04/11/2007 [facility] Patient Information 2015 [place], [place]. This information is not intended to replace advice given to you by your health care provider. Make sure you discuss any questions you have with your health care provider.

## 2014-02-28 NOTE — ED Provider Notes (Signed)
CSN: [id number]     Arrival date & time 02/28/14  0757 History   First MD Initiated Contact with Patient 02/28/14 [phone]     Chief Complaint  Patient presents with  . Abdominal Pain   Patient is a 28 y.o. male presenting with abdominal pain. The history is provided by the patient and medical records.  Abdominal Pain Pain location:  Epigastric Pain quality: cramping and sharp   Pain radiates to:  Does not radiate Pain severity:  Severe Onset quality:  Gradual Duration:  1 week Timing:  Constant Progression:  Worsening Chronicity:  Recurrent Context comment:  Marijuana use Relieved by:  Nothing Associated symptoms: nausea and vomiting   Associated symptoms: no chest pain, no chills, no constipation, no cough, no diarrhea, no dysuria, no fever, no hematuria, no shortness of breath and no sore throat   Risk factors comment:  Marijuana use  Pt is a 28 yo M with a PMH of marijuana use, kidney stones, GERD and cyclic vomiting syndrome who presents with abdominal pain and decreased PO intake. Patient has had multiple ED presentations for same and has required fluid boluses and antiemetics for symptom resolution. Symptoms x 1 week. Patient reports red streaked emesis x 3 days and emesis with coffee ground appearance last night and this morning. He is asking for dilaudid. However, during his last visit to the ED he received fentanyl and toradol with symptomatic relief. Patient continues to smoke marijuana (2 joints a week) as well as cigarettes.   Past Medical History  Diagnosis Date  . GERD (gastroesophageal reflux disease)   . Nephrolithiasis   . Cyclic vomiting syndrome    Past Surgical History  Procedure Laterality Date  . Unremarkable    . Appendectomy     Family History  Problem Relation Age of Onset  . Colon cancer Maternal Uncle   . Liver cancer Mother   . Diabetes Maternal Aunt   . Heart disease Paternal Grandfather   . Testicular cancer Brother    History  Substance Use  Topics  . Smoking status: Former Smoker -- 1 years    Types: Cigarettes    Quit date: 06/04/2007  . Smokeless tobacco: Never Used     Comment: see illicit drug comment  . Alcohol Use: Yes     Comment: occasional-once per month    Review of Systems  Constitutional: Negative for fever and chills.  HENT: Negative for rhinorrhea and sore throat.   Eyes: Negative for visual disturbance.  Respiratory: Negative for cough and shortness of breath.   Cardiovascular: Negative for chest pain.  Gastrointestinal: Positive for nausea, vomiting and abdominal pain. Negative for diarrhea, constipation and blood in stool.  Genitourinary: Negative for dysuria and hematuria.  Musculoskeletal: Negative for back pain and neck pain.  Skin: Negative for rash.  Neurological: Negative for syncope and headaches.  Psychiatric/Behavioral: Negative for confusion.  All other systems reviewed and are negative.   Allergies  Review of patient's allergies indicates no known allergies.  Home Medications   Prior to Admission medications   Medication Sig Start Date End Date Taking? Authorizing Provider  cephALEXin (KEFLEX) 500 MG capsule Take 1 capsule (500 mg total) by mouth 4 (four) times daily. 02/21/14   Tatyana A Kirichenko, PA-C  ibuprofen (ADVIL,MOTRIN) 800 MG tablet Take 1 tablet (800 mg total) by mouth 3 (three) times daily. 02/21/14   Tatyana A Kirichenko, PA-C  metoCLOPramide (REGLAN) 10 MG tablet Take 1 tablet (10 mg total) by mouth every 6 (six)  hours as needed for nausea (nausea/headache). 02/26/14   Enid Skeens, MD  naproxen (NAPROSYN) 500 MG tablet Take 1 tablet (500 mg total) by mouth every 8 (eight) hours as needed. 02/26/14   Enid Skeens, MD  ondansetron (ZOFRAN) 4 MG tablet Take 1 tablet (4 mg total) by mouth every 8 (eight) hours as needed for nausea or vomiting. 02/28/14   Maris Berger, MD  promethazine (PHENERGAN) 25 MG tablet Take 1 tablet (25 mg total) by mouth every 6 (six) hours as  needed for nausea or vomiting. 02/28/14   Maris Berger, MD  sulfamethoxazole-trimethoprim (SEPTRA DS) 800-160 MG per tablet Take 1 tablet by mouth every 12 (twelve) hours. 02/21/14   Tatyana A Kirichenko, PA-C   BP 179/91  Pulse 44  Temp(Src) 98.1 F (36.7 C) (Oral)  Resp 20  SpO2 98% Physical Exam  Constitutional: He is oriented to person, place, and time. He appears well-developed and well-nourished. No distress.  HENT:  Head: Normocephalic and atraumatic.  Mouth/Throat: Oropharynx is clear and moist.  Eyes: EOM are normal.  No conjunctival pallor  Neck: Neck supple. No JVD present.  Cardiovascular: Normal rate, regular rhythm, normal heart sounds and intact distal pulses.   Cap refill >3sec  Pulmonary/Chest: Effort normal and breath sounds normal.  Abdominal: Soft. He exhibits no distension. There is tenderness (mild, epigastric). There is no rebound and no guarding.  Musculoskeletal: Normal range of motion. He exhibits no edema.  Neurological: He is alert and oriented to person, place, and time. No cranial nerve deficit.  Skin: Skin is warm and dry.  normal turgor.  Psychiatric: His behavior is normal.   ED Course  Procedures  None   Labs Review Labs Reviewed  COMPREHENSIVE METABOLIC PANEL - Abnormal; Notable for the following:    Sodium 134 (*)    Chloride 93 (*)    BUN 25 (*)    Total Protein 8.5 (*)    All other components within normal limits  LIPASE, BLOOD  CBC WITH DIFFERENTIAL   MDM   Final diagnoses:  Intractable vomiting with nausea, vomiting of unspecified type  Marijuana abuse    28 yo M with marijuana abuse and cyclic vomiting syndrome presents with emesis and abd pain. BUN:Cr ratio >20. NS bolus x 2L given. Fentanyl for pain. zofran for nausea. GI cocktail given for further symptomatic relief. Pt intermittently bradycardic and states he normally has a low HR. No AMS or hypotension. After multiple rounds of zofran, patient still nauseated with inability  to tolerate PO. Will given phenergan and reglan in an attempt to improve patient's symptoms. Patient reassessed and able to tolerate PO and abd pain improved. Instructed him to stop using marijuana and to call his PCP today or tomorrow to schedule a f/u visit within the next 1-2 weeks. Patient voices understanding and is agreeable with this plan. Stable at discharge.   Case discussed with Dr. Romeo Apple.  Maris Berger, MD  Maris Berger, MD 02/28/14 [phone]

## 2014-02-28 NOTE — ED Notes (Signed)
Pt tolerating apple juice and water

## 2014-02-28 NOTE — ED Provider Notes (Signed)
Medical screening examination/treatment/procedure(s) were conducted as a shared visit with resident physician and myself.  I personally evaluated the patient during the encounter.   I interviewed and examined the patient. Lungs are CTAB. Cardiac exam wnl. Abdomen soft w/ mild epig ttp. I interpreted/reviewed the labs and/or imaging which were non-contributory.  Pt sx improved w/ tx. Plan for d/c home.   Purvis SheffieldForrest Cherity Blickenstaff, MD 02/28/14 [phone]

## 2014-02-28 NOTE — ED Notes (Signed)
Pt c/o n/v & abd pain for one week. Pt sts he has had some blood in vomited, Pt denies diarrhea and sts last BM 02/21/14. Pt sts he has been taking zofran w/o relief.

## 2014-03-01 ENCOUNTER — Emergency Department (HOSPITAL_COMMUNITY): Payer: 59

## 2014-03-01 ENCOUNTER — Encounter (HOSPITAL_COMMUNITY): Payer: Self-pay | Admitting: Emergency Medicine

## 2014-03-01 ENCOUNTER — Emergency Department (HOSPITAL_COMMUNITY)
Admission: EM | Admit: 2014-03-01 | Discharge: 2014-03-02 | Disposition: A | Discharge status: Home / Self Care | Payer: 59 | Attending: Emergency Medicine | Admitting: Emergency Medicine

## 2014-03-01 DIAGNOSIS — F13129 Sedative, hypnotic or anxiolytic abuse with intoxication, unspecified: Secondary | ICD-10-CM | POA: Insufficient documentation

## 2014-03-01 DIAGNOSIS — Z792 Long term (current) use of antibiotics: Secondary | ICD-10-CM | POA: Diagnosis not present

## 2014-03-01 DIAGNOSIS — F12129 Cannabis abuse with intoxication, unspecified: Secondary | ICD-10-CM | POA: Insufficient documentation

## 2014-03-01 DIAGNOSIS — R197 Diarrhea, unspecified: Secondary | ICD-10-CM | POA: Insufficient documentation

## 2014-03-01 DIAGNOSIS — G43A0 Cyclical vomiting, not intractable: Secondary | ICD-10-CM | POA: Diagnosis not present

## 2014-03-01 DIAGNOSIS — K219 Gastro-esophageal reflux disease without esophagitis: Secondary | ICD-10-CM | POA: Insufficient documentation

## 2014-03-01 DIAGNOSIS — R1084 Generalized abdominal pain: Secondary | ICD-10-CM | POA: Diagnosis not present

## 2014-03-01 DIAGNOSIS — Z79899 Other long term (current) drug therapy: Secondary | ICD-10-CM | POA: Insufficient documentation

## 2014-03-01 DIAGNOSIS — R1115 Cyclical vomiting syndrome unrelated to migraine: Secondary | ICD-10-CM

## 2014-03-01 DIAGNOSIS — Z87442 Personal history of urinary calculi: Secondary | ICD-10-CM | POA: Insufficient documentation

## 2014-03-01 DIAGNOSIS — Z9089 Acquired absence of other organs: Secondary | ICD-10-CM | POA: Diagnosis not present

## 2014-03-01 DIAGNOSIS — Z791 Long term (current) use of non-steroidal anti-inflammatories (NSAID): Secondary | ICD-10-CM | POA: Insufficient documentation

## 2014-03-01 DIAGNOSIS — R1033 Periumbilical pain: Secondary | ICD-10-CM | POA: Diagnosis present

## 2014-03-01 DIAGNOSIS — Z87891 Personal history of nicotine dependence: Secondary | ICD-10-CM | POA: Diagnosis not present

## 2014-03-01 LAB — COMPREHENSIVE METABOLIC PANEL
ALBUMIN: 4.5 g/dL (ref 3.5–5.2)
ALT: 16 U/L (ref 0–53)
AST: 17 U/L (ref 0–37)
Alkaline Phosphatase: 78 U/L (ref 39–117)
Anion gap: 14 (ref 5–15)
BUN: 15 mg/dL (ref 6–23)
CALCIUM: 9.2 mg/dL (ref 8.4–10.5)
CO2: 25 meq/L (ref 19–32)
Chloride: 90 mEq/L — ABNORMAL LOW (ref 96–112)
Creatinine, Ser: 0.89 mg/dL (ref 0.50–1.35)
GFR calc Af Amer: >90 mL/min (ref >90)
Glucose, Bld: 115 mg/dL — ABNORMAL HIGH (ref 70–99)
Potassium: 3.4 mEq/L — ABNORMAL LOW (ref 3.7–5.3)
SODIUM: 129 meq/L — AB (ref 137–147)
Total Bilirubin: 0.9 mg/dL (ref 0.3–1.2)
Total Protein: 7.7 g/dL (ref 6.0–8.3)

## 2014-03-01 LAB — CBC WITH DIFFERENTIAL/PLATELET
Basophils Absolute: 0 10*3/uL (ref 0.0–0.1)
Basophils Relative: 0 % (ref 0–1)
EOS PCT: 0 % (ref 0–5)
Eosinophils Absolute: 0 10*3/uL (ref 0.0–0.7)
HEMATOCRIT: 37.4 % — AB (ref 39.0–52.0)
HEMOGLOBIN: 13.8 g/dL (ref 13.0–17.0)
Lymphocytes Relative: 8 % — ABNORMAL LOW (ref 12–46)
Lymphs Abs: 0.6 10*3/uL — ABNORMAL LOW (ref 0.7–4.0)
MCH: 32.8 pg (ref 26.0–34.0)
MCHC: 36.9 g/dL — ABNORMAL HIGH (ref 30.0–36.0)
MCV: 88.8 fL (ref 78.0–100.0)
MONOS PCT: 7 % (ref 3–12)
Monocytes Absolute: 0.5 10*3/uL (ref 0.1–1.0)
Neutro Abs: 6.1 10*3/uL (ref 1.7–7.7)
Neutrophils Relative %: 85 % — ABNORMAL HIGH (ref 43–77)
Platelets: 184 10*3/uL (ref 150–400)
RBC: 4.21 MIL/uL — ABNORMAL LOW (ref 4.22–5.81)
RDW: 11.3 % — ABNORMAL LOW (ref 11.5–15.5)
WBC: 7.2 10*3/uL (ref 4.0–10.5)

## 2014-03-01 LAB — LIPASE, BLOOD: Lipase: 16 U/L (ref 11–59)

## 2014-03-01 MED ORDER — DIPHENHYDRAMINE HCL 50 MG/ML IJ SOLN
25.0000 mg | Freq: Once | INTRAMUSCULAR | Status: AC
  Administered 2014-03-01: 25 mg via INTRAVENOUS
  Filled 2014-03-01: qty 1

## 2014-03-01 MED ORDER — METOCLOPRAMIDE HCL 5 MG/ML IJ SOLN
10.0000 mg | Freq: Once | INTRAMUSCULAR | Status: AC
  Administered 2014-03-01: 10 mg via INTRAVENOUS
  Filled 2014-03-01: qty 2

## 2014-03-01 MED ORDER — SODIUM CHLORIDE 0.9 % IV BOLUS (SEPSIS)
1000.0000 mL | Freq: Once | INTRAVENOUS | Status: AC
  Administered 2014-03-01: 1000 mL via INTRAVENOUS

## 2014-03-01 MED ORDER — ONDANSETRON HCL 4 MG/2ML IJ SOLN
4.0000 mg | Freq: Once | INTRAMUSCULAR | Status: AC
  Administered 2014-03-01: 4 mg via INTRAVENOUS
  Filled 2014-03-01: qty 2

## 2014-03-01 MED ORDER — ONDANSETRON HCL 4 MG PO TABS: 4.0000 mg | ORAL_TABLET | Freq: Four times a day (QID) | ORAL | Status: DC

## 2014-03-01 MED ORDER — IOHEXOL 300 MG/ML  SOLN
50.0000 mL | Freq: Once | INTRAMUSCULAR | Status: AC | PRN
  Administered 2014-03-01: 50 mL via INTRAVENOUS

## 2014-03-01 MED ORDER — IOHEXOL 300 MG/ML  SOLN
100.0000 mL | Freq: Once | INTRAMUSCULAR | Status: AC | PRN
  Administered 2014-03-01: 100 mL via INTRAVENOUS

## 2014-03-01 NOTE — ED Provider Notes (Signed)
CSN: [id number]     Arrival date & time 03/01/14  1802 History   First MD Initiated Contact with Patient 03/01/14 1817     Chief Complaint  Patient presents with  . Abdominal Pain  . Emesis  . Diarrhea     (Consider location/radiation/quality/duration/timing/severity/associated sxs/prior Treatment) HPI Landers Lyn Hollingsheadlexander is a 28 y.o. male has been seen twice for the past week for abdominal pain with negative workup presents today for evaluation of continuous abdominal pain. Patient states he has had nausea, vomiting for the past week and every time he goes to the hospital and gets better, he goes home and starts all over. He complains of abdominal pain he locates to the right of his umbilicus and just above it. He characterizes the pain as a sharp cramping that does not radiate. He had an appointment to followup with GI today, but he missed it because he "felt sick this morning". He reports taking his Phenergan he was prescribed, but has not experienced relief. Patient reports having previously been counseled on marijuana use and cervical vomiting, reports no marijuana use in 3 weeks. Patient is also actively hiccuping.  Past Medical History  Diagnosis Date  . GERD (gastroesophageal reflux disease)   . Nephrolithiasis   . Cyclic vomiting syndrome    Past Surgical History  Procedure Laterality Date  . Unremarkable    . Appendectomy     Family History  Problem Relation Age of Onset  . Colon cancer Maternal Uncle   . Liver cancer Mother   . Diabetes Maternal Aunt   . Heart disease Paternal Grandfather   . Testicular cancer Brother    History  Substance Use Topics  . Smoking status: Former Smoker -- 1 years    Types: Cigarettes    Quit date: 06/04/2007  . Smokeless tobacco: Never Used     Comment: see illicit drug comment  . Alcohol Use: Yes     Comment: occasional-once per month    Review of Systems  Constitutional: Negative for fever.  HENT: Negative for sore throat.    Eyes: Negative for visual disturbance.  Respiratory: Negative for shortness of breath.   Cardiovascular: Negative for chest pain.  Gastrointestinal: Positive for nausea, vomiting and abdominal pain.  Endocrine: Negative for polyuria.  Genitourinary: Negative for dysuria.  Skin: Negative for rash.  Neurological: Negative for headaches.      Allergies  Review of patient's allergies indicates no known allergies.  Home Medications   Prior to Admission medications   Medication Sig Start Date End Date Taking? Authorizing Provider  ondansetron (ZOFRAN) 4 MG tablet Take 4 mg by mouth every 8 (eight) hours as needed for nausea or vomiting (nausea).   Yes Historical Provider, MD  cephALEXin (KEFLEX) 500 MG capsule Take 500 mg by mouth 4 (four) times daily.    Historical Provider, MD  ibuprofen (ADVIL,MOTRIN) 800 MG tablet Take 1 tablet (800 mg total) by mouth 3 (three) times daily. 02/21/14   Tatyana A Kirichenko, PA-C  metoCLOPramide (REGLAN) 10 MG tablet Take 1 tablet (10 mg total) by mouth every 6 (six) hours as needed for nausea (nausea/headache). 02/26/14   Enid SkeensJoshua M Zavitz, MD  naproxen (NAPROSYN) 500 MG tablet Take 1 tablet (500 mg total) by mouth every 8 (eight) hours as needed. 02/26/14   Enid SkeensJoshua M Zavitz, MD  ondansetron (ZOFRAN) 4 MG tablet Take 1 tablet (4 mg total) by mouth every 6 (six) hours. 03/01/14   Earle GellBenjamin W Modupe Shampine, PA-C  promethazine (PHENERGAN) 25 MG  tablet Take 1 tablet (25 mg total) by mouth every 6 (six) hours as needed for nausea or vomiting. 02/28/14   Maris Berger, MD  sulfamethoxazole-trimethoprim (SEPTRA DS) 800-160 MG per tablet Take 1 tablet by mouth every 12 (twelve) hours. 02/21/14   Tatyana A Kirichenko, PA-C   BP 165/86  Pulse 50  Temp(Src) 98.3 F (36.8 C) (Oral)  Resp 16  SpO2 100% Physical Exam  Nursing note and vitals reviewed. Constitutional: He is oriented to person, place, and time. He appears well-developed and well-nourished.  HENT:  Head:  Normocephalic and atraumatic.  Mouth/Throat: Oropharynx is clear and moist.  Eyes: Conjunctivae are normal. Pupils are equal, round, and reactive to light. Right eye exhibits no discharge. Left eye exhibits no discharge. No scleral icterus.  Neck: Neck supple.  Cardiovascular: Normal rate, regular rhythm and normal heart sounds.   Pulmonary/Chest: Effort normal and breath sounds normal. No respiratory distress. He has no wheezes. He has no rales.  Abdominal: Soft. There is tenderness.  Abdomen is soft and nondistended. There is tenderness to palpation in the right lower quadrant, over the umbilicus as well as just superior to the umbilicus. No obvious lesions, rashes or deformities appreciated. No organomegaly.  Musculoskeletal: He exhibits no tenderness.  Neurological: He is alert and oriented to person, place, and time.  Cranial Nerves II-XII grossly intact  Skin: Skin is warm and dry. No rash noted.  Psychiatric: He has a normal mood and affect.    ED Course  Procedures (including critical care time) Labs Review Labs Reviewed  COMPREHENSIVE METABOLIC PANEL - Abnormal; Notable for the following:    Sodium 129 (*)    Potassium 3.4 (*)    Chloride 90 (*)    Glucose, Bld 115 (*)    All other components within normal limits  CBC WITH DIFFERENTIAL - Abnormal; Notable for the following:    RBC 4.21 (*)    HCT 37.4 (*)    MCHC 36.9 (*)    RDW 11.3 (*)    Neutrophils Relative % 85 (*)    Lymphocytes Relative 8 (*)    Lymphs Abs 0.6 (*)    All other components within normal limits  LIPASE, BLOOD  URINE RAPID DRUG SCREEN (HOSP PERFORMED)    Imaging Review Ct Abdomen Pelvis W Contrast  03/01/2014   CLINICAL DATA:  Right lower quadrant abdominal pain. Nausea, vomiting, and diarrhea for 2 weeks.  EXAM: CT ABDOMEN AND PELVIS WITH CONTRAST  TECHNIQUE: Multidetector CT imaging of the abdomen and pelvis was performed using the standard protocol following bolus administration of intravenous  contrast.  CONTRAST:  OMNIPAQUE IOHEXOL 300 MG/ML SOLN, 50mL OMNIPAQUE IOHEXOL 300 MG/ML SOLN  COMPARISON:  Ultrasound abdomen 05/25/2012. CT abdomen and pelvis 06/04/2011.  FINDINGS: Lung bases are clear.  Small esophageal hiatal hernia.  Small area of focal fatty infiltration in the liver adjacent to the falciform ligament. No other focal liver lesions identified. The gallbladder, pancreas, spleen, adrenal glands, kidneys, abdominal aorta, inferior vena cava, and retroperitoneal lymph nodes are unremarkable. Stomach, small bowel, and colon are not abnormally distended. No free air or free fluid in the abdomen.  Pelvis: Appendix is not definitively identified but no inflammatory changes are suggested in the right lower quadrant. Small amount of free fluid in the pelvis is nonspecific but was also present previously without change. Sigmoid colon is decompressed. No evidence of diverticulitis. Bladder wall is not thickened. No pelvic mass or lymphadenopathy. Normal alignment of the lumbar spine. No destructive  bone lesions appreciated.  IMPRESSION: No acute process demonstrated in the abdomen or pelvis to account for patient's pain. There is a small amount of free fluid in the pelvis which is nonspecific but was present previously without change. Appendix is not identified but no inflammatory changes are suggested in the right lower quadrant.   Electronically Signed   By: Burman Nieves M.D.   On: 03/01/2014 21:59     EKG Interpretation None     Filed Vitals:   03/01/14 1813 03/01/14 2011  BP: 162/89 165/86  Pulse: 50   Temp: 98.8 F (37.1 C) 98.3 F (36.8 C)  TempSrc: Oral Oral  Resp: 20 16  SpO2: 100%    Meds given in ED:  Medications  ondansetron (ZOFRAN) injection 4 mg (4 mg Intravenous Given 03/01/14 2009)  iohexol (OMNIPAQUE) 300 MG/ML solution 50 mL (50 mLs Intravenous Contrast Given 03/01/14 2010)  sodium chloride 0.9 % bolus 1,000 mL (1,000 mLs Intravenous New Bag/Given 03/01/14  2208)  metoCLOPramide (REGLAN) injection 10 mg (10 mg Intravenous Given 03/01/14 2203)  diphenhydrAMINE (BENADRYL) injection 25 mg (25 mg Intravenous Given 03/01/14 2204)  iohexol (OMNIPAQUE) 300 MG/ML solution 100 mL (100 mLs Intravenous Contrast Given 03/01/14 2127)    New Prescriptions   ONDANSETRON (ZOFRAN) 4 MG TABLET    Take 1 tablet (4 mg total) by mouth every 6 (six) hours.     MDM  Vitals stable -  -afebrile. Patient brady at baseline. Bolus given Pt resting comfortably in ED. NAD. Nursing reports he is actively trying to make himself vomit. Physical exam and clinical picture not concerning for emergent pathology at this time. Acute appendicitis less likely. Patient has been seen twice this week for the same complaint negative workup. And when I asked the patient what helps his nausea and he responded "hydrocodone" then he said "the only other thing that works is Dilaudid" Labwork--CBC, CMP and lipase completed yesterday and today are noncontributory Imaging- Ct abd showed no acute processes and no source for his current N/V and symptomology.   Pt still has Phenergan at home, will give Zofran ODT to manage N/V symptoms. Advised fluid rehydration. Discussed maintaining f/u with GI/PCP as well as return precautions, discontinuing use of marijuana,  pt very amenable to plan. Prior to patient discharge, I discussed and reviewed this case with Dr. Noel Gerold. He also saw the patient.  Final diagnoses:  Abdominal pain, generalized  Non-intractable cyclical vomiting with nausea        Sharlene Motts, PA-C 03/01/14 2334

## 2014-03-01 NOTE — ED Notes (Signed)
Pt reports 2 episodes of diarrhea and countless number of episodes vomiting today. Pt reports ate toast this morning but that was all he could get down. Pt actively vomiting at present time. PA made aware.

## 2014-03-01 NOTE — ED Notes (Signed)
Pt c/o RLQ abdominal pain and n/v/d x 2 weeks.  Pain score 9/10.  [facility].  Pt reports taking phenergan w/o relief.  Pt has not followed up w/ GI.

## 2014-03-01 NOTE — ED Notes (Signed)
Requested patient to urinate. 

## 2014-03-01 NOTE — ED Notes (Signed)
Patient tried to urinate.  

## 2014-03-01 NOTE — ED Provider Notes (Signed)
Medical screening examination/treatment/procedure(s) were conducted as a shared visit with non-physician practitioner(s) and myself.  I personally evaluated the patient during the encounter.   EKG Interpretation None       Briefly, pt is a 28 y.o. male presenting with recurrent abd pain.  He's been seen numerous times for the same symptoms. Likely etiology of the symptoms was thought to be cyclic vomiting syndrome secondary to marijuana use.  I performed an examination on the patient including cardiac, pulmonary, and gi systems which were remarkable for epigastric tenderness. CT scan obtained and unremarkable for acute pathology. Instructed patient to followup with gastroenterology as planned. Standard return precautions given.     Mirian MoMatthew Gentry, MD 03/01/14 [phone]

## 2014-03-02 LAB — RAPID URINE DRUG SCREEN, HOSP PERFORMED
Amphetamines: NOT DETECTED
BARBITURATES: POSITIVE — AB
BENZODIAZEPINES: NOT DETECTED
Cocaine: NOT DETECTED
Opiates: NOT DETECTED
Tetrahydrocannabinol: POSITIVE — AB

## 2014-03-02 MED ORDER — ONDANSETRON 8 MG PO TBDP
8.0000 mg | ORAL_TABLET | Freq: Once | ORAL | Status: AC
  Administered 2014-03-02: 8 mg via ORAL
  Filled 2014-03-02: qty 1

## 2014-10-29 ENCOUNTER — Emergency Department (HOSPITAL_COMMUNITY)
Admission: EM | Admit: 2014-10-29 | Discharge: 2014-10-29 | Disposition: A | Discharge status: Home / Self Care | Payer: Commercial Managed Care - PPO | Attending: Emergency Medicine | Admitting: Emergency Medicine

## 2014-10-29 ENCOUNTER — Encounter (HOSPITAL_COMMUNITY): Payer: Self-pay | Admitting: Neurology

## 2014-10-29 DIAGNOSIS — R001 Bradycardia, unspecified: Secondary | ICD-10-CM | POA: Insufficient documentation

## 2014-10-29 DIAGNOSIS — K2970 Gastritis, unspecified, without bleeding: Secondary | ICD-10-CM | POA: Diagnosis not present

## 2014-10-29 DIAGNOSIS — Z87442 Personal history of urinary calculi: Secondary | ICD-10-CM | POA: Insufficient documentation

## 2014-10-29 DIAGNOSIS — R112 Nausea with vomiting, unspecified: Secondary | ICD-10-CM

## 2014-10-29 DIAGNOSIS — Z720 Tobacco use: Secondary | ICD-10-CM | POA: Diagnosis not present

## 2014-10-29 DIAGNOSIS — R1115 Cyclical vomiting syndrome unrelated to migraine: Secondary | ICD-10-CM

## 2014-10-29 LAB — CBC WITH DIFFERENTIAL/PLATELET
BASOS ABS: 0 10*3/uL (ref 0.0–0.1)
Basophils Relative: 0 % (ref 0–1)
EOS PCT: 0 % (ref 0–5)
Eosinophils Absolute: 0 10*3/uL (ref 0.0–0.7)
HCT: 36.3 % — ABNORMAL LOW (ref 39.0–52.0)
HEMOGLOBIN: 13 g/dL (ref 13.0–17.0)
Lymphocytes Relative: 17 % (ref 12–46)
Lymphs Abs: 0.9 10*3/uL (ref 0.7–4.0)
MCH: 33.2 pg (ref 26.0–34.0)
MCHC: 35.8 g/dL (ref 30.0–36.0)
MCV: 92.6 fL (ref 78.0–100.0)
Monocytes Absolute: 0.4 10*3/uL (ref 0.1–1.0)
Monocytes Relative: 9 % (ref 3–12)
NEUTROS PCT: 74 % (ref 43–77)
Neutro Abs: 3.8 10*3/uL (ref 1.7–7.7)
Platelets: 162 10*3/uL (ref 150–400)
RBC: 3.92 MIL/uL — ABNORMAL LOW (ref 4.22–5.81)
RDW: 11.7 % (ref 11.5–15.5)
WBC: 5.1 10*3/uL (ref 4.0–10.5)

## 2014-10-29 LAB — COMPREHENSIVE METABOLIC PANEL
ALT: 20 U/L (ref 17–63)
AST: 21 U/L (ref 15–41)
Albumin: 4.3 g/dL (ref 3.5–5.0)
Alkaline Phosphatase: 55 U/L (ref 38–126)
Anion gap: 10 (ref 5–15)
BUN: 21 mg/dL — AB (ref 6–20)
CALCIUM: 9.5 mg/dL (ref 8.9–10.3)
CO2: 28 mmol/L (ref 22–32)
Chloride: 94 mmol/L — ABNORMAL LOW (ref 101–111)
Creatinine, Ser: 1.08 mg/dL (ref 0.61–1.24)
GLUCOSE: 112 mg/dL — AB (ref 65–99)
Potassium: 3.6 mmol/L (ref 3.5–5.1)
SODIUM: 132 mmol/L — AB (ref 135–145)
Total Bilirubin: 0.9 mg/dL (ref 0.3–1.2)
Total Protein: 7.2 g/dL (ref 6.5–8.1)

## 2014-10-29 LAB — LIPASE, BLOOD: Lipase: 18 U/L — ABNORMAL LOW (ref 22–51)

## 2014-10-29 MED ORDER — KETOROLAC TROMETHAMINE 30 MG/ML IJ SOLN
30.0000 mg | Freq: Once | INTRAMUSCULAR | Status: AC
  Administered 2014-10-29: 30 mg via INTRAVENOUS
  Filled 2014-10-29: qty 1

## 2014-10-29 MED ORDER — ONDANSETRON HCL 4 MG PO TABS: 4.0000 mg | ORAL_TABLET | Freq: Four times a day (QID) | ORAL | Status: DC

## 2014-10-29 MED ORDER — GI COCKTAIL ~~LOC~~: 30.0000 mL | Freq: Once | ORAL | Status: DC

## 2014-10-29 MED ORDER — OMEPRAZOLE 20 MG PO CPDR: 20.0000 mg | DELAYED_RELEASE_CAPSULE | Freq: Every day | ORAL | Status: DC

## 2014-10-29 MED ORDER — PROMETHAZINE HCL 25 MG/ML IJ SOLN
12.5000 mg | Freq: Once | INTRAMUSCULAR | Status: AC
  Administered 2014-10-29: 12.5 mg via INTRAVENOUS
  Filled 2014-10-29 (×2): qty 1

## 2014-10-29 MED ORDER — SODIUM CHLORIDE 0.9 % IV BOLUS (SEPSIS)
1000.0000 mL | Freq: Once | INTRAVENOUS | Status: AC
  Administered 2014-10-29: 1000 mL via INTRAVENOUS

## 2014-10-29 MED ORDER — ONDANSETRON HCL 4 MG/2ML IJ SOLN
4.0000 mg | Freq: Once | INTRAMUSCULAR | Status: AC
  Administered 2014-10-29: 4 mg via INTRAVENOUS
  Filled 2014-10-29: qty 2

## 2014-10-29 MED ORDER — SUCRALFATE 1 G PO TABS
1.0000 g | ORAL_TABLET | Freq: Once | ORAL | Status: AC
  Administered 2014-10-29: 1 g via ORAL
  Filled 2014-10-29: qty 1

## 2014-10-29 MED ORDER — SUCRALFATE 1 G PO TABS: 1.0000 g | ORAL_TABLET | Freq: Three times a day (TID) | ORAL | Status: DC

## 2014-10-29 MED ORDER — FENTANYL CITRATE (PF) 100 MCG/2ML IJ SOLN
50.0000 ug | Freq: Once | INTRAMUSCULAR | Status: AC
  Administered 2014-10-29: 50 ug via INTRAVENOUS
  Filled 2014-10-29: qty 2

## 2014-10-29 MED ORDER — PANTOPRAZOLE SODIUM 40 MG IV SOLR
40.0000 mg | Freq: Once | INTRAVENOUS | Status: AC
  Administered 2014-10-29: 40 mg via INTRAVENOUS
  Filled 2014-10-29: qty 40

## 2014-10-29 NOTE — ED Notes (Signed)
Pt. Unable to void at the moment. Pt. Stated wasn't able to hold much fluid down if we were able to give more fluids via IV then he thinks he would be able to urinate.

## 2014-10-29 NOTE — ED Notes (Signed)
The patient is aware that a urine specimen is needed. 

## 2014-10-29 NOTE — Discharge Instructions (Signed)
Gastritis, Adult Gastritis is soreness and swelling (inflammation) of the lining of the stomach. Gastritis can develop as a sudden onset (acute) or long-term (chronic) condition. If gastritis is not treated, it can lead to stomach bleeding and ulcers. CAUSES  Gastritis occurs when the stomach lining is weak or damaged. Digestive juices from the stomach then inflame the weakened stomach lining. The stomach lining may be weak or damaged due to viral or bacterial infections. One common bacterial infection is the Helicobacter pylori infection. Gastritis can also result from excessive alcohol consumption, taking certain medicines, or having too much acid in the stomach.  SYMPTOMS  In some cases, there are no symptoms. When symptoms are present, they may include:  Pain or a burning sensation in the upper abdomen.  Nausea.  Vomiting.  An uncomfortable feeling of fullness after eating. DIAGNOSIS  Your caregiver may suspect you have gastritis based on your symptoms and a physical exam. To determine the cause of your gastritis, your caregiver may perform the following: 1. Blood or stool tests to check for the H pylori bacterium. 2. Gastroscopy. A thin, flexible tube (endoscope) is passed down the esophagus and into the stomach. The endoscope has a light and camera on the end. Your caregiver uses the endoscope to view the inside of the stomach. 3. Taking a tissue sample (biopsy) from the stomach to examine under a microscope. TREATMENT  Depending on the cause of your gastritis, medicines may be prescribed. If you have a bacterial infection, such as an H pylori infection, antibiotics may be given. If your gastritis is caused by too much acid in the stomach, H2 blockers or antacids may be given. Your caregiver may recommend that you stop taking aspirin, ibuprofen, or other nonsteroidal anti-inflammatory drugs (NSAIDs). HOME CARE INSTRUCTIONS  Only take over-the-counter or prescription medicines as directed  by your caregiver.  If you were given antibiotic medicines, take them as directed. Finish them even if you start to feel better.  Drink enough fluids to keep your urine clear or pale yellow.  Avoid foods and drinks that make your symptoms worse, such as:  Caffeine or alcoholic drinks.  Chocolate.  Peppermint or mint flavorings.  Garlic and onions.  Spicy foods.  Citrus fruits, such as oranges, lemons, or limes.  Tomato-based foods such as sauce, chili, salsa, and pizza.  Fried and fatty foods.  Eat small, frequent meals instead of large meals. SEEK IMMEDIATE MEDICAL CARE IF:   You have black or dark red stools.  You vomit blood or material that looks like coffee grounds.  You are unable to keep fluids down.  Your abdominal pain gets worse.  You have a fever.  You do not feel better after 1 week.  You have any other questions or concerns. MAKE SURE YOU:  Understand these instructions.  Will watch your condition.  Will get help right away if you are not doing well or get worse. Document Released: 05/11/2001 Document Revised: 11/16/2011 Document Reviewed: 06/30/2011 [facility] Patient Information 2015 [place], [place]. This information is not intended to replace advice given to you by your health care provider. Make sure you discuss any questions you have with your health care provider.  Food Choices for Gastroesophageal Reflux Disease When you have gastroesophageal reflux disease (GERD), the foods you eat and your eating habits are very important. Choosing the right foods can help ease the discomfort of GERD. WHAT GENERAL GUIDELINES DO I NEED TO FOLLOW?  Choose fruits, vegetables, whole grains, low-fat dairy products, and low-fat  meat, fish, and poultry.  Limit fats such as oils, salad dressings, butter, nuts, and avocado.  Keep a food diary to identify foods that cause symptoms.  Avoid foods that cause reflux. These may be different for different people.  Eat  frequent small meals instead of three large meals each day.  Eat your meals slowly, in a relaxed setting.  Limit fried foods.  Cook foods using methods other than frying.  Avoid drinking alcohol.  Avoid drinking large amounts of liquids with your meals.  Avoid bending over or lying down until 2-3 hours after eating. WHAT FOODS ARE NOT RECOMMENDED? The following are some foods and drinks that may worsen your symptoms: Vegetables Tomatoes. Tomato juice. Tomato and spaghetti sauce. Chili peppers. Onion and garlic. Horseradish. Fruits Oranges, grapefruit, and lemon (fruit and juice). Meats High-fat meats, fish, and poultry. This includes hot dogs, ribs, ham, sausage, salami, and bacon. Dairy Whole milk and chocolate milk. Sour cream. Cream. Butter. Ice cream. Cream cheese.  Beverages Coffee and tea, with or without caffeine. Carbonated beverages or energy drinks. Condiments Hot sauce. Barbecue sauce.  Sweets/Desserts Chocolate and cocoa. Donuts. Peppermint and spearmint. Fats and Oils High-fat foods, including [place] fries and potato chips. Other Vinegar. Strong spices, such as black pepper, white pepper, red pepper, cayenne, curry powder, cloves, ginger, and chili powder. The items listed above may not be a complete list of foods and beverages to avoid. Contact your dietitian for more information. Document Released: 05/17/2005 Document Revised: 05/22/2013 Document Reviewed: 03/21/2013 [facility] Patient Information 2015 [place]. This information is not intended to replace advice given to you by your health care provider. Make sure you discuss any questions you have with your health care provider.   Cyclic Vomiting Syndrome Cyclic vomiting syndrome is a benign condition in which patients experience bouts or cycles of severe nausea and vomiting that last for hours or even days. The bouts of nausea and vomiting alternate with longer periods of no symptoms and generally good  health. Cyclic vomiting syndrome occurs mostly in children, but can affect adults. CAUSES  [facility] has no known cause. Each episode is typically similar to the previous ones. The episodes tend to:   Start at about the same time of day.  Last the same length of time.  Present the same symptoms at the same level of intensity. Cyclic vomiting syndrome can begin at any age in children and adults. Cyclic vomiting syndrome usually starts between the ages of 3 and 7 years. In adults, episodes tend to occur less often than they do in children, but they last longer. Furthermore, the events or situations that trigger episodes in adults cannot always be pinpointed as easily as they can in children. There are 4 phases of cyclic vomiting syndrome: 4. Prodrome. The prodrome phase signals that an episode of nausea and vomiting is about to begin. This phase can last from just a few minutes to several hours. This phase is often marked by belly (abdominal) pain. Sometimes taking medicine early in the prodrome phase can stop an episode in progress. However, sometimes there is no warning. A person may simply wake up in the middle of the night or early morning and begin vomiting. 5. Episode. The episode phase consists of:  Severe vomiting.  Nausea.  Gagging (retching). 6. Recovery. The recovery phase begins when the nausea and vomiting stop. Healthy color, appetite, and energy return. 7. Symptom-free interval. The symptom-free interval phase is the period between episodes when no symptoms are present. TRIGGERS  Episodes can be triggered by an infection or event. Examples of triggers include:  Infections.  Colds, allergies, sinus problems, and the flu.  Eating certain foods such as chocolate or cheese.  Foods with monosodium glutamate (MSG) or preservatives.  Fast foods.  Pre-packaged foods.  Foods with low nutritional value (junk foods).  Overeating.  Eating just before going to bed.  Hot  weather.  Dehydration.  Not enough sleep or poor sleep quality.  Physical exhaustion.  Menstruation.  Motion sickness.  Emotional stress (school or home difficulties).  Excitement or stress. SYMPTOMS  The main symptoms of cyclic vomiting syndrome are:  Severe vomiting.  Nausea.  Gagging (retching). Episodes usually begin at night or the first thing in the morning. Episodes may include vomiting or retching up to 5 or 6 times an hour during the worst of the episode. Episodes usually last anywhere from 1 to 4 days. Episodes can last for up to 10 days. Other symptoms include:  Paleness.  Exhaustion.  Listlessness.  Abdominal pain.  Loose stools or diarrhea. Sometimes the nausea and vomiting are so severe that a person appears to be almost unconscious. Sensitivity to light, headache, fever, dizziness, may also accompany an episode. In addition, the vomiting may cause drooling and excessive thirst. Drinking water usually leads to more vomiting, though the water can dilute the acid in the vomit, making the episode a little less painful. Continuous vomiting can lead to dehydration, which means that the body has lost excessive water and salts. DIAGNOSIS  Cyclic vomiting syndrome is hard to diagnose because there are no clear tests to identify it. A caregiver must diagnose cyclic vomiting syndrome by looking at symptoms and medical history. A caregiver must exclude more common diseases or disorders that can also cause nausea and vomiting. Also, diagnosis takes time because caregivers need to identify a pattern or cycle to the vomiting. TREATMENT  Cyclic vomiting syndrome cannot be cured. Treatment varies, but people with cyclic vomiting syndrome should get plenty of rest and sleep and take medications that prevent, stop, or lessen the vomiting episodes and other symptoms. People whose episodes are frequent and long-lasting may be treated during the symptom-free intervals in an effort to  prevent or ease future episodes. The symptom-free phase is a good time to eliminate anything known to trigger an episode. For example, if episodes are brought on by stress or excitement, this period is the time to find ways to reduce stress and stay calm. If sinus problems or allergies cause episodes, those conditions should be treated. The triggers listed above should be avoided or prevented. Because of the similarities between migraine and cyclic vomiting syndrome, caregivers treat some people with severe cyclic vomiting syndrome with drugs that are also used for migraine headaches. The drugs are designed to:  Prevent episodes.  Reduce their frequency.  Lessen their severity. HOME CARE INSTRUCTIONS Once a vomiting episode begins, treatment is supportive. It helps to stay in bed and sleep in a dark, quiet room. Severe nausea and vomiting may require hospitalization and intravenous (IV) fluids to prevent dehydration. Relaxing medications (sedatives) may help if the nausea continues. Sometimes, during the prodrome phase, it is possible to stop an episode from happening altogether. Only take over-the-counter or prescription medicines for pain, discomfort or fever as directed by your caregiver. Do not give aspirin to children. During the recovery phase, drinking water and replacing lost electrolytes (salts in the blood) are very important. Electrolytes are salts that the body needs to function well and  stay healthy. Symptoms during the recovery phase can vary. Some people find that their appetites return to normal immediately, while others need to begin by drinking clear liquids and then move slowly to solid food. RELATED COMPLICATIONS The severe vomiting that defines cyclic vomiting syndrome is a risk factor for several complications:  Dehydration--Vomiting causes the body to lose water quickly.  Electrolyte imbalance--Vomiting also causes the body to lose the important salts it needs to keep working  properly.  Peptic esophagitis--The tube that connects the mouth to the stomach (esophagus) becomes injured from the stomach acid that comes up with the vomit.  Hematemesis--The esophagus becomes irritated and bleeds, so blood mixes with the vomit.  Mallory-Weiss tear--The lower end of the esophagus may tear open or the stomach may bruise from vomiting or retching.  Tooth decay--The acid in the vomit can hurt the teeth by corroding the tooth enamel. SEEK MEDICAL CARE IF: You have questions or problems. Document Released: 07/26/2001 Document Revised: 08/09/2011 Document Reviewed: 08/24/2010 [facility] Patient Information 2015 [place], [place]. This information is not intended to replace advice given to you by your health care provider. Make sure you discuss any questions you have with your health care provider.

## 2014-10-29 NOTE — ED Provider Notes (Signed)
CSN: [id number]     Arrival date & time 10/29/14  1056 History   First MD Initiated Contact with Patient 10/29/14 1108     Chief Complaint  Patient presents with  . Emesis     (Consider location/radiation/quality/duration/timing/severity/associated sxs/prior Treatment) HPI Erik Cowan is a 29 year old male with past medical history of GERD, cyclic vomiting syndrome, gastritis who presents the ER complaining of nausea, vomiting, upper abdominal pain. Patient states his symptoms began approximately 3 days ago, and persisted. Patient states he has seen gastroenterology for ongoing symptoms which have brought him into the emergency room for identical signs and symptoms previously, gastroenterology advised him that he has a food-induced gastritis. Patient states 3 days ago he ate lasagna, which he states is a food that aggravates his gastritis. Patient states his Symptoms Have Pers[facility], and He Has Gotten to the [place] Where He Is Unable to Keep Food or Fluids down. Patient states he actively takes Prilosec at home, however he has been out of his medication. He states his last follow with GI was approximately 2 months ago.  Past Medical History  Diagnosis Date  . GERD (gastroesophageal reflux disease)   . Nephrolithiasis   . Cyclic vomiting syndrome    Past Surgical History  Procedure Laterality Date  . Unremarkable    . Appendectomy     Family History  Problem Relation Age of Onset  . Colon cancer Maternal Uncle   . Liver cancer Mother   . Diabetes Maternal Aunt   . Heart disease Paternal Grandfather   . Testicular cancer Brother    History  Substance Use Topics  . Smoking status: Current Every Day Smoker -- 1 years    Types: Cigarettes    Last Attempt to Quit: 06/04/2007  . Smokeless tobacco: Never Used     Comment: see illicit drug comment  . Alcohol Use: Yes     Comment: occasional-once per month    Review of Systems  Constitutional: Negative  for fever.  HENT: Negative for trouble swallowing.   Eyes: Negative for visual disturbance.  Respiratory: Negative for shortness of breath.   Cardiovascular: Negative for chest pain.  Gastrointestinal: Positive for nausea, vomiting and abdominal pain.  Genitourinary: Negative for dysuria.  Musculoskeletal: Negative for neck pain.  Skin: Negative for rash.  Neurological: Negative for dizziness, weakness and numbness.  Psychiatric/Behavioral: Negative.       Allergies  Review of patient's allergies indicates no known allergies.  Home Medications   Prior to Admission medications   Medication Sig Start Date End Date Taking? Authorizing Provider  ibuprofen (ADVIL,MOTRIN) 800 MG tablet Take 1 tablet (800 mg total) by mouth 3 (three) times daily. Patient not taking: Reported on 10/29/2014 02/21/14   Jaynie Crumbleatyana Kirichenko, PA-C  omeprazole (PRILOSEC) 20 MG capsule Take 1 capsule (20 mg total) by mouth daily. 10/29/14   Ladona MowJoe Tessi Eustache, PA-C  ondansetron (ZOFRAN) 4 MG tablet Take 1 tablet (4 mg total) by mouth every 6 (six) hours. 10/29/14   Ladona MowJoe Dalan Cowger, PA-C  sucralfate (CARAFATE) 1 G tablet Take 1 tablet (1 g total) by mouth 4 (four) times daily -  with meals and at bedtime. 10/29/14   Ladona MowJoe Boby Eyer, PA-C   BP 146/97 mmHg  Pulse 87  Temp(Src) 98.6 F (37 C) (Oral)  Resp 16  Ht 6\' 2"  (1.88 m)  Wt 145 lb (65.772 kg)  BMI 18.61 kg/m2  SpO2 100% Physical Exam  Constitutional: He is oriented to person, place, and time. He  appears well-developed and well-nourished. No distress.  HENT:  Head: Normocephalic and atraumatic.  Mouth/Throat: Oropharynx is clear and moist. No oropharyngeal exudate.  Eyes: Right eye exhibits no discharge. Left eye exhibits no discharge. No scleral icterus.  Neck: Normal range of motion.  Cardiovascular: Regular rhythm, S1 normal, S2 normal and normal heart sounds.  Bradycardia present.   No murmur heard. Pulmonary/Chest: Effort normal and breath sounds normal. No respiratory  distress.  Abdominal: Soft. Normal appearance. There is tenderness in the epigastric area and left upper quadrant. There is no rigidity, no guarding, no tenderness at McBurney's point and negative Murphy's sign.  Mild, epigastric tenderness and left upper quadrant tenderness.   Musculoskeletal: Normal range of motion. He exhibits no edema or tenderness.  Neurological: He is alert and oriented to person, place, and time. He has normal strength. No cranial nerve deficit or sensory deficit. Coordination normal.  Patient fully alert, answering questions appropriately in full, clear sentences. Cranial nerves II through XII grossly intact. Motor strength 5 out of 5 in all major muscle groups of upper and lower extremities. Distal sensation intact.   Skin: Skin is warm and dry. No rash noted. He is not diaphoretic.  Psychiatric: He has a normal mood and affect.  Nursing note and vitals reviewed.   ED Course  Procedures (including critical care time) Labs Review Labs Reviewed  CBC WITH DIFFERENTIAL/PLATELET - Abnormal; Notable for the following:    RBC 3.92 (*)    HCT 36.3 (*)    All other components within normal limits  COMPREHENSIVE METABOLIC PANEL - Abnormal; Notable for the following:    Sodium 132 (*)    Chloride 94 (*)    Glucose, Bld 112 (*)    BUN 21 (*)    All other components within normal limits  LIPASE, BLOOD - Abnormal; Notable for the following:    Lipase 18 (*)    All other components within normal limits  URINALYSIS, ROUTINE W REFLEX MICROSCOPIC (NOT AT [facility])    Imaging Review No results found.   EKG Interpretation None      MDM   Final diagnoses:  Nausea and vomiting, vomiting of unspecified type  Gastritis  Non-intractable cyclical vomiting with nausea   Patient here with left upper quadrant abdominal pain, nausea and vomiting consistent with previously diagnosed gastritis. Patient states lasagna he ate several days ago made his symptoms began. He states food  like this typically sets off his symptoms. After therapy here, patient is feeling well, stating he is ready to go home. Patient states after the symptoms began the typically take approximately 2 days to completely resolve. Patient is nontoxic, nonseptic appearing, in no apparent distress.  Patient's pain and other symptoms adequately managed in emergency department.  Fluid bolus given.  Labs, imaging and vitals reviewed.  Patient does not meet the SIRS or Sepsis criteria.  On repeat exam patient does not have a surgical abdomin and there are no peritoneal signs. Pt tolerating PO.  No indication of appendicitis, bowel obstruction, bowel perforation, cholecystitis, diverticulitis.  Patient discharged home with symptomatic treatment and given strict instructions for follow-up with their gastroenterologist.  I have also discussed reasons to return immediately to the ER.  Patient expresses understanding and agrees with plan.  BP 146/97 mmHg  Pulse 87  Temp(Src) 98.6 F (37 C) (Oral)  Resp 16  Ht  (1.88 m)  Wt 145 lb (65.772 kg)  BMI 18.61 kg/m2  SpO2 100%  Signed,  Ladona Mow, PA-C  4:50 PM      Ladona Mow, PA-C 10/29/14 1650  Ladona Mow, PA-C 10/29/14 1659  Mirian Mo, MD 11/02/14 [phone]

## 2014-10-29 NOTE — ED Notes (Signed)
Pt reports vomiting x 2 days. Has cyclic vomiting syndrome. States ran out of pain meds 2 days ago when vomiting started.

## 2014-10-29 NOTE — ED Notes (Signed)
Pt provided coke for oral trial.

## 2014-11-13 ENCOUNTER — Inpatient Hospital Stay
Admit: 2014-11-13 | Discharge: 2014-11-14 | Disposition: A | Discharge status: Other Acute Inpatient Hospital | Payer: Self-pay | Attending: Emergency Medical Services

## 2014-11-13 DIAGNOSIS — R109 Unspecified abdominal pain: Secondary | ICD-10-CM

## 2014-11-13 MED ORDER — FAMOTIDINE 20 MG TAB
20 mg | ORAL | Status: AC
Start: 2014-11-13 — End: 2014-11-13
  Administered 2014-11-14: via ORAL

## 2014-11-13 MED ORDER — SODIUM CHLORIDE 0.9 % IJ SYRG
Freq: Once | INTRAMUSCULAR | Status: DC
Start: 2014-11-13 — End: 2014-11-14

## 2014-11-13 NOTE — ED Provider Notes (Signed)
[facility]  EMERGENCY DEPARTMENT TREATMENT REPORT  NAME:  Rodney Chavez, [place]  SEX:   M  ADMIT: 11/13/2014  DOB:   July 17, 1985  MR#    [id number]  ROOM:  [id number]  TIME DICTATED: 06 44 PM  ACCT#  [id number]        PRIMARY CARE PROVIDER:  None.    CHIEF COMPLAINT:  Behavioral changes, decreased appetite.    HISTORY OF PRESENT ILLNESS:  This is a 29 year old male here for a mental evaluation brought in his   grandmother. The patient's grandmother has noticed he has had some behavioral   changes. He seems manic. He only eats 1 meal a day he tells me because he has   had decreased appetite and occasionally has epigastric pain. He has lost about   40 pounds in the past 2 months. He denies having any nausea, vomiting, or   diarrhea. There is no psych history for the patient himself or in the family.     REVIEW OF SYSTEMS:   CONSTITUTIONAL: No fever or chills.  EYES:  No visual symptoms.  ENT:  No sore throat, runny nose, or other URI symptoms.  RESPIRATORY: No cough, shortness of breath, or wheezing.   CARDIOVASCULAR:  No chest pain, chest pressure, or palpitations.  GASTROINTESTINAL:  Abdominal pain. No nausea, vomiting or diarrhea.   GENITOURINARY: No dysuria, frequency, or urgency.  MUSCULOSKELETAL:  No joint pain or swelling.   INTEGUMENTARY:  No rashes.  PSYCHIATRIC:  No suicidal or homicidal ideation.     PAST MEDICAL HISTORY:   None.     SOCIAL HISTORY:   No tobacco.     FAMILY HISTORY:  None.     ALLERGIES:   NONE.     MEDICATIONS:   None.    PHYSICAL EXAMINATION:   VITAL SIGNS:  Blood pressure is 139/66, pulse 71, respirations 16, temperature   is 97.1, oxygen saturation is 96% on room air.  GENERAL APPEARANCE:  Patient appears well developed and well nourished.   Appearance and behavior are age and situation appropriate. Calm and   cooperative.   HEENT: Eyes:  Conjunctivae clear, lids normal. Pupils equal, symmetrical and   normally reactive. Mouth/Throat:  Surfaces of the pharynx, palate, and tongue    are pink, moist, and without lesions.  NECK:  Supple, nontender, symmetric, no masses or JVD, trachea midline,   thyroid not enlarged, nodular or tender.  RESPIRATORY: Clear and equal breath sounds.  No respiratory distress,   tachypnea, or accessory muscle use.  CARDIOVASCULAR:  Heart regular without murmurs, gallops, rubs or thrills.  CHEST:  Chest symmetrical without masses or tenderness.   GASTROINTESTINAL: Abdomen is soft, nondistended, nontender, active bowel   sounds.  EXTREMITIES: Normal exam without edema or deformity.  MUSCULOSKELETAL: Stance and gait appear normal.  SKIN:  warm and dry without lesions.   PSYCHIATRIC: Judgment appears appropriate. Recent and remote memory appear to   be intact. Oriented to time and place and person. Mood and affect appropriate.         CONTINUATION BY Dianna Rossetti, MD:    The patient received a bed at [facility], Dr. Loleta Chance.  Will be   transferred for psychiatric admission.    IMPRESSION:  Acute mania.    DISPOSITION:  Transfer to psych.      ___________________  Lucio Edward MD  Dictated By: Caprice Renshaw E. Mertie Moores, PA-C    My signature above authenticates this document and my orders, the final  diagnosis (es), discharge prescription (s), and instructions in the Epic   record.  If you have any questions please contact [phone].    Nursing notes have been reviewed by the physician/ advanced practice   clinician.    [facility]  D:11/13/2014 18:44:38  T: 11/13/2014 20:42:01  [id number]

## 2014-11-13 NOTE — ED Notes (Signed)
Report to Rick RN

## 2014-11-13 NOTE — ED Notes (Signed)
Pt family feels he is having a Psychotic break. Pt says he feels no one loves him. Pt from [place].

## 2014-11-14 LAB — CBC WITH AUTOMATED DIFF
BASOPHILS: 0.4 % (ref 0–3)
EOSINOPHILS: 1.1 % (ref 0–5)
HCT: 36.5 % — ABNORMAL LOW (ref 37.0–50.0)
HGB: 12.7 gm/dl (ref 12.4–17.2)
IMMATURE GRANULOCYTES: 0.2 % (ref 0.0–3.0)
LYMPHOCYTES: 29 % (ref 28–48)
MCH: 34 pg (ref 23.0–34.6)
MCHC: 34.8 gm/dl (ref 30.0–36.0)
MCV: 97.6 fL (ref 80.0–98.0)
MONOCYTES: 7.9 % (ref 1–13)
MPV: 9.3 fL (ref 6.0–10.0)
NEUTROPHILS: 61.4 % (ref 34–64)
NRBC: 0 (ref 0–0)
PLATELET: 165 10*3/uL (ref 140–450)
RBC: 3.74 M/uL — ABNORMAL LOW (ref 3.80–5.70)
RDW-SD: 42.5 (ref 35.1–43.9)
WBC: 9 10*3/uL (ref 4.0–11.0)

## 2014-11-14 LAB — DRUG SCREEN, URINE
Amphetamine: NEGATIVE
Barbiturates: NEGATIVE
Benzodiazepines: NEGATIVE
Cocaine: NEGATIVE
Marijuana: POSITIVE — AB
Methadone: NEGATIVE
Opiates: NEGATIVE
Phencyclidine: NEGATIVE

## 2014-11-14 LAB — METABOLIC PANEL, COMPREHENSIVE
ALT (SGPT): 23 U/L (ref 12–78)
AST (SGOT): 20 U/L (ref 15–37)
Albumin: 4.3 gm/dl (ref 3.4–5.0)
Alk. phosphatase: 107 U/L (ref 45–117)
BUN: 20 mg/dl (ref 7–25)
Bilirubin, total: 0.9 mg/dl (ref 0.2–1.0)
CO2: 23 mEq/L (ref 21–32)
Calcium: 9.2 mg/dl (ref 8.5–10.1)
Chloride: 105 mEq/L (ref 98–107)
Creatinine: 1.1 mg/dl (ref 0.6–1.3)
GFR est AA: >60
GFR est non-AA: >60
Glucose: 112 mg/dl — ABNORMAL HIGH (ref 74–106)
Potassium: 3.6 mEq/L (ref 3.5–5.1)
Protein, total: 7.6 gm/dl (ref 6.4–8.2)
Sodium: 140 mEq/L (ref 136–145)

## 2014-11-14 LAB — LIPASE: Lipase: 177 U/L (ref 73–393)

## 2014-11-14 LAB — ETHYL ALCOHOL: ALCOHOL(ETHYL),SERUM: NEGATIVE mg/dl (ref 0.0–9.0)

## 2014-11-14 MED ORDER — NICOTINE 21 MG/24 HR DAILY PATCH
21 mg/24 hr | TRANSDERMAL | Status: DC
Start: 2014-11-14 — End: 2014-11-14

## 2014-11-14 MED FILL — FAMOTIDINE 20 MG TAB: 20 mg | ORAL | Qty: 1

## 2014-11-14 MED FILL — NICOTINE 21 MG/24 HR DAILY PATCH: 21 mg/24 hr | TRANSDERMAL | Qty: 1

## 2014-11-14 NOTE — ED Notes (Signed)
Client in room has had inc anxiety at times and hyperverbal.

## 2015-03-01 ENCOUNTER — Emergency Department (HOSPITAL_COMMUNITY)
Admission: EM | Admit: 2015-03-01 | Discharge: 2015-03-01 | Disposition: A | Discharge status: Home / Self Care | Payer: Commercial Managed Care - PPO | Attending: Emergency Medicine | Admitting: Emergency Medicine

## 2015-03-01 ENCOUNTER — Encounter (HOSPITAL_COMMUNITY): Payer: Self-pay | Admitting: Emergency Medicine

## 2015-03-01 ENCOUNTER — Emergency Department (HOSPITAL_COMMUNITY): Payer: Commercial Managed Care - PPO

## 2015-03-01 DIAGNOSIS — Z79899 Other long term (current) drug therapy: Secondary | ICD-10-CM | POA: Insufficient documentation

## 2015-03-01 DIAGNOSIS — S8992XA Unspecified injury of left lower leg, initial encounter: Secondary | ICD-10-CM | POA: Insufficient documentation

## 2015-03-01 DIAGNOSIS — Z87442 Personal history of urinary calculi: Secondary | ICD-10-CM | POA: Insufficient documentation

## 2015-03-01 DIAGNOSIS — Z8669 Personal history of other diseases of the nervous system and sense organs: Secondary | ICD-10-CM | POA: Diagnosis not present

## 2015-03-01 DIAGNOSIS — Y9289 Other specified places as the place of occurrence of the external cause: Secondary | ICD-10-CM | POA: Diagnosis not present

## 2015-03-01 DIAGNOSIS — M25561 Pain in right knee: Secondary | ICD-10-CM

## 2015-03-01 DIAGNOSIS — M25562 Pain in left knee: Secondary | ICD-10-CM

## 2015-03-01 DIAGNOSIS — Y9389 Activity, other specified: Secondary | ICD-10-CM | POA: Insufficient documentation

## 2015-03-01 DIAGNOSIS — Y998 Other external cause status: Secondary | ICD-10-CM | POA: Insufficient documentation

## 2015-03-01 DIAGNOSIS — Z8659 Personal history of other mental and behavioral disorders: Secondary | ICD-10-CM | POA: Diagnosis not present

## 2015-03-01 DIAGNOSIS — Z720 Tobacco use: Secondary | ICD-10-CM | POA: Insufficient documentation

## 2015-03-01 DIAGNOSIS — K219 Gastro-esophageal reflux disease without esophagitis: Secondary | ICD-10-CM | POA: Insufficient documentation

## 2015-03-01 DIAGNOSIS — S8991XA Unspecified injury of right lower leg, initial encounter: Secondary | ICD-10-CM | POA: Diagnosis not present

## 2015-03-01 HISTORY — DX: Bipolar disorder, unspecified: F31.9

## 2015-03-01 MED ORDER — IBUPROFEN 800 MG PO TABS: 800.0000 mg | ORAL_TABLET | Freq: Three times a day (TID) | ORAL | Status: DC
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
## 2015-03-01 NOTE — Discharge Instructions (Signed)
Take ibuprofen as needed for pain. Refer to attached documents for more information.  °
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
## 2015-03-12 ENCOUNTER — Encounter (HOSPITAL_COMMUNITY): Payer: Self-pay | Admitting: *Deleted

## 2015-03-12 ENCOUNTER — Emergency Department (HOSPITAL_COMMUNITY)
Admission: EM | Admit: 2015-03-12 | Discharge: 2015-03-12 | Disposition: A | Discharge status: Home / Self Care | Payer: Commercial Managed Care - PPO | Attending: Emergency Medicine | Admitting: Emergency Medicine

## 2015-03-12 DIAGNOSIS — F319 Bipolar disorder, unspecified: Secondary | ICD-10-CM | POA: Insufficient documentation

## 2015-03-12 DIAGNOSIS — Z8669 Personal history of other diseases of the nervous system and sense organs: Secondary | ICD-10-CM | POA: Insufficient documentation

## 2015-03-12 DIAGNOSIS — Z87442 Personal history of urinary calculi: Secondary | ICD-10-CM | POA: Insufficient documentation

## 2015-03-12 DIAGNOSIS — F1210 Cannabis abuse, uncomplicated: Secondary | ICD-10-CM | POA: Diagnosis not present

## 2015-03-12 DIAGNOSIS — R440 Auditory hallucinations: Secondary | ICD-10-CM

## 2015-03-12 DIAGNOSIS — R109 Unspecified abdominal pain: Secondary | ICD-10-CM | POA: Diagnosis not present

## 2015-03-12 DIAGNOSIS — Z79899 Other long term (current) drug therapy: Secondary | ICD-10-CM | POA: Diagnosis not present

## 2015-03-12 DIAGNOSIS — K219 Gastro-esophageal reflux disease without esophagitis: Secondary | ICD-10-CM | POA: Diagnosis not present

## 2015-03-12 DIAGNOSIS — Z720 Tobacco use: Secondary | ICD-10-CM | POA: Diagnosis not present

## 2015-03-12 DIAGNOSIS — Z791 Long term (current) use of non-steroidal anti-inflammatories (NSAID): Secondary | ICD-10-CM | POA: Insufficient documentation

## 2015-03-12 LAB — RAPID URINE DRUG SCREEN, HOSP PERFORMED
Amphetamines: NOT DETECTED
BARBITURATES: NOT DETECTED
BENZODIAZEPINES: NOT DETECTED
COCAINE: NOT DETECTED
Opiates: NOT DETECTED
Tetrahydrocannabinol: POSITIVE — AB

## 2015-03-12 LAB — URINALYSIS, ROUTINE W REFLEX MICROSCOPIC
Bilirubin Urine: NEGATIVE
GLUCOSE, UA: NEGATIVE mg/dL
Hgb urine dipstick: NEGATIVE
KETONES UR: NEGATIVE mg/dL
LEUKOCYTES UA: NEGATIVE
Nitrite: NEGATIVE
PH: 8 (ref 5.0–8.0)
Protein, ur: NEGATIVE mg/dL
Specific Gravity, Urine: 1.018 (ref 1.005–1.030)
Urobilinogen, UA: 0.2 mg/dL (ref 0.0–1.0)

## 2015-03-12 LAB — CBC
HEMATOCRIT: 36.6 % — AB (ref 39.0–52.0)
Hemoglobin: 12.8 g/dL — ABNORMAL LOW (ref 13.0–17.0)
MCH: 33.4 pg (ref 26.0–34.0)
MCHC: 35 g/dL (ref 30.0–36.0)
MCV: 95.6 fL (ref 78.0–100.0)
Platelets: 206 10*3/uL (ref 150–400)
RBC: 3.83 MIL/uL — AB (ref 4.22–5.81)
RDW: 12.1 % (ref 11.5–15.5)
WBC: 7.2 10*3/uL (ref 4.0–10.5)

## 2015-03-12 LAB — COMPREHENSIVE METABOLIC PANEL
ALT: 20 U/L (ref 17–63)
AST: 22 U/L (ref 15–41)
Albumin: 4.2 g/dL (ref 3.5–5.0)
Alkaline Phosphatase: 89 U/L (ref 38–126)
Anion gap: 4 — ABNORMAL LOW (ref 5–15)
BUN: 21 mg/dL — ABNORMAL HIGH (ref 6–20)
CHLORIDE: 99 mmol/L — AB (ref 101–111)
CO2: 31 mmol/L (ref 22–32)
Calcium: 9.4 mg/dL (ref 8.9–10.3)
Creatinine, Ser: 0.86 mg/dL (ref 0.61–1.24)
Glucose, Bld: 115 mg/dL — ABNORMAL HIGH (ref 65–99)
POTASSIUM: 4.2 mmol/L (ref 3.5–5.1)
Sodium: 134 mmol/L — ABNORMAL LOW (ref 135–145)
Total Bilirubin: 0.5 mg/dL (ref 0.3–1.2)
Total Protein: 7.2 g/dL (ref 6.5–8.1)

## 2015-03-12 LAB — URINE MICROSCOPIC-ADD ON

## 2015-03-12 LAB — LIPASE, BLOOD: LIPASE: 21 U/L — AB (ref 22–51)

## 2015-03-12 MED ORDER — DICYCLOMINE HCL 20 MG PO TABS
ORAL_TABLET | ORAL | Status: AC
  Filled 2015-03-12: qty 1

## 2015-03-12 MED ORDER — METOCLOPRAMIDE HCL 10 MG PO TABS
ORAL_TABLET | ORAL | Status: AC
  Filled 2015-03-12: qty 1

## 2015-03-12 MED ORDER — ONDANSETRON HCL 4 MG/2ML IJ SOLN: 4.0000 mg | Freq: Once | INTRAMUSCULAR | Status: DC | PRN

## 2015-03-12 NOTE — ED Provider Notes (Signed)
CSN: [id number]     Arrival date & time 03/12/15  0004 History  By signing my name below, I, Budd Palmer, attest that this documentation has been prepared under the direction and in the presence of Tomasita Crumble, MD. Electronically Signed: Budd Palmer, ED Scribe. 03/12/2015. 3:02 AM.    Chief Complaint  Patient presents with  . Abdominal Pain   The history is provided by the patient. No language interpreter was used.   HPI Comments: Erik Cowan is a 29 y.o. male brought in by ambulance, who presents to the Emergency Department complaining of hearing voices that tell him to "get up and go." Pt denies SI and HI.  He also c/o constant, aching abdominal pain, vomiting, sore throat (onset 2 weeks ago), subjective fever, and cough. He denies using drugs or alcohol tonight.   Past Medical History  Diagnosis Date  . GERD (gastroesophageal reflux disease)   . Nephrolithiasis   . Cyclic vomiting syndrome   . Bipolar disorder [facility])    Past Surgical History  Procedure Laterality Date  . Unremarkable    . Appendectomy     Family History  Problem Relation Age of Onset  . Colon cancer Maternal Uncle   . Liver cancer Mother   . Diabetes Maternal Aunt   . Heart disease Paternal Grandfather   . Testicular cancer Brother    Social History  Substance Use Topics  . Smoking status: Current Every Day Smoker -- 1 years    Types: Cigarettes    Last Attempt to Quit: 06/04/2007  . Smokeless tobacco: Never Used     Comment: see illicit drug comment  . Alcohol Use: Yes     Comment: occasional-once per month    Review of Systems A complete 10 system review of systems was obtained and all systems are negative except as noted in the HPI and PMH.   Allergies  Review of patient's allergies indicates no known allergies.  Home Medications   Prior to Admission medications   Medication Sig Start Date End Date Taking? Authorizing Provider  LORazepam (ATIVAN) 2 MG tablet Take 2 mg by mouth  every 6 (six) hours as needed for anxiety.   Yes Historical Provider, MD  ondansetron (ZOFRAN-ODT) 4 MG disintegrating tablet Take 1 tablet by mouth every 8 (eight) hours as needed. Nausea/vomiting 02/17/15  Yes Historical Provider, MD  QUEtiapine (SEROQUEL) 200 MG tablet Take 1 tablet by mouth every evening. For 7 days (free med) 12/26/14  Yes Historical Provider, MD  QUEtiapine (SEROQUEL) 50 MG tablet Take 1 tablet by mouth daily. For 7 days (free med) 12/26/14  Yes Historical Provider, MD  ibuprofen (ADVIL,MOTRIN) 800 MG tablet Take 1 tablet (800 mg total) by mouth 3 (three) times daily. 03/01/15   Kaitlyn Szekalski, PA-C  omeprazole (PRILOSEC) 20 MG capsule Take 1 capsule (20 mg total) by mouth daily. 10/29/14   Ladona Mow, PA-C  ondansetron (ZOFRAN) 4 MG tablet Take 1 tablet (4 mg total) by mouth every 6 (six) hours. 10/29/14   Ladona Mow, PA-C  sucralfate (CARAFATE) 1 G tablet Take 1 tablet (1 g total) by mouth 4 (four) times daily -  with meals and at bedtime. 10/29/14   Ladona Mow, PA-C   BP 139/76 mmHg  Pulse 75  Temp(Src) 98.3 F (36.8 C) (Oral)  Resp 22  Wt 160 lb (72.576 kg)  SpO2 99% Physical Exam  Constitutional: He is oriented to person, place, and time. Vital signs are normal. He appears well-developed and well-nourished.  Non-toxic appearance. He does not appear ill. No distress.  frequent hiccoughing on exam  HENT:  Head: Normocephalic and atraumatic.  Nose: Nose normal.  Mouth/Throat: Oropharynx is clear and moist. No oropharyngeal exudate.  Eyes: Conjunctivae and EOM are normal. Pupils are equal, round, and reactive to light. No scleral icterus.  Neck: Normal range of motion. Neck supple. No tracheal deviation, no edema, no erythema and normal range of motion present. No thyroid mass and no thyromegaly present.  Cardiovascular: Normal rate, regular rhythm, S1 normal, S2 normal, normal heart sounds, intact distal pulses and normal pulses.  Exam reveals no gallop and no friction rub.    No murmur heard. Pulses:      Radial pulses are 2+ on the right side, and 2+ on the left side.       Dorsalis pedis pulses are 2+ on the right side, and 2+ on the left side.  Pulmonary/Chest: Effort normal and breath sounds normal. No respiratory distress. He has no wheezes. He has no rhonchi. He has no rales.  Abdominal: Soft. Normal appearance and bowel sounds are normal. He exhibits no distension, no ascites and no mass. There is no hepatosplenomegaly. There is no tenderness. There is no rebound, no guarding and no CVA tenderness.  Musculoskeletal: Normal range of motion. He exhibits no edema or tenderness.  Lymphadenopathy:    He has no cervical adenopathy.  Neurological: He is alert and oriented to person, place, and time. He has normal strength. No cranial nerve deficit or sensory deficit.  Skin: Skin is warm, dry and intact. No petechiae and no rash noted. He is not diaphoretic. No erythema. No pallor.  Psychiatric: He has a normal mood and affect. His behavior is normal. Judgment normal.  Nursing note and vitals reviewed.   ED Course  Procedures  DIAGNOSTIC STUDIES: Oxygen Saturation is 99% on RA, normal by my interpretation.    COORDINATION OF CARE: 1:29 AM - Discussed plans to give anti-nausea medication. Pt advised of plan for treatment and pt agrees.  Labs Review Labs Reviewed  LIPASE, BLOOD - Abnormal; Notable for the following:    Lipase 21 (*)    All other components within normal limits  COMPREHENSIVE METABOLIC PANEL - Abnormal; Notable for the following:    Sodium 134 (*)    Chloride 99 (*)    Glucose, Bld 115 (*)    BUN 21 (*)    Anion gap 4 (*)    All other components within normal limits  CBC - Abnormal; Notable for the following:    RBC 3.83 (*)    Hemoglobin 12.8 (*)    HCT 36.6 (*)    All other components within normal limits  URINALYSIS, ROUTINE W REFLEX MICROSCOPIC (NOT AT [facility]) - Abnormal; Notable for the following:    APPearance TURBID (*)     All other components within normal limits  URINE RAPID DRUG SCREEN, HOSP PERFORMED - Abnormal; Notable for the following:    Tetrahydrocannabinol POSITIVE (*)    All other components within normal limits  URINE MICROSCOPIC-ADD ON    Imaging Review No results found. I have personally reviewed and evaluated these images and lab results as part of my medical decision-making.   EKG Interpretation None      MDM   Final diagnoses:  None    patient presents to the emergency department for hearing voices. These voices tell him to get up and go. He initially denied any abdominal pain or vomiting to me. I informed  him that the triage notes state he had some abdominal pain, he then admitted to me that his stomach did hurt. He's had no episodes of vomiting emergency depart. He was given Reglan and Bentyl for his symptoms. Patient tolerated this well, he appears well in no acute distress, his vital signs were within his normal limits and is safe for discharge.  I, Zenobia Kuennen, personally performed the services described in this documentation. All medical record entries made by the scribe were at my direction and in my presence.  I have reviewed the chart and discharge instructions and agree that the record reflects my personal performance and is accurate and complete. Santana Gosdin.  03/12/2015. 4:48 AM.     Tomasita Crumble, MD 03/12/15 [phone]

## 2015-03-12 NOTE — Discharge Instructions (Signed)
Abdominal Pain, Adult Erik Cowan, see a primary care doctor within 3 days for your abdominal pain and hearing voices.  There are resources attached to call for psychiatric help.  If any symptoms worsen, come back to the ED immediately.  Thank you. Many things can cause belly (abdominal) pain. Most times, the belly pain is not dangerous. Many cases of belly pain can be watched and treated at home. HOME CARE   Do not take medicines that help you go poop (laxatives) unless told to by your doctor.  Only take medicine as told by your doctor.  Eat or drink as told by your doctor. Your doctor will tell you if you should be on a special diet. GET HELP IF:  You do not know what is causing your belly pain.  You have belly pain while you are sick to your stomach (nauseous) or have runny poop (diarrhea).  You have pain while you pee or poop.  Your belly pain wakes you up at night.  You have belly pain that gets worse or better when you eat.  You have belly pain that gets worse when you eat fatty foods.  You have a fever. GET HELP RIGHT AWAY IF:   The pain does not go away within 2 hours.  You keep throwing up (vomiting).  The pain changes and is only in the right or left part of the belly.  You have bloody or tarry looking poop. MAKE SURE YOU:   Understand these instructions.  Will watch your condition.  Will get help right away if you are not doing well or get worse.   This information is not intended to replace advice given to you by your health care provider. Make sure you discuss any questions you have with your health care provider.   Document Released: 11/03/2007 Document Revised: 06/07/2014 Document Reviewed: 01/24/2013 Elsevier Interactive Patient Education 2016 [employer]. Substance Abuse Treatment Programs  Intensive Outpatient Programs [facility]     [address]      [place], [place]                   [phone]       [facility]  [facility] [address] [place] [address] [place], [place] [phone]  [facility]     (Inpatient and outpatient)     [address]           [phone]    [facility] [phone] (Suboxone and Methadone)  [address]      [place], [place] [address]      [phone]       [address] [place], [place] [phone]  [facility] (Outpatient/Inpatient, Chemical)    (insurance only) [phone]             [facility] (Groups & Residential) [place], [place] [phone]     [facility]     [address]     [place], [place]      [phone]       [facility] (for caregivers and family) [phone] Laurell [address] [place], [place] [phone]      Residential Treatment Programs [facility]      [address] [place], [place] [address]  [phone]       [facility] [address] [place], [place] [address] [phone]  [facility] [place]        [phone]  [facility] [phone]  [facility] ([facility]             [address]                                         [place], [place]                                                [phone] or [phone]                               [facility] [place] [address] [place], [address] [phone]  [facility]    [address]      [place], [place]     [phone]       [facility] [address] [place], [place] [phone]  [facility]   [address]     [place], [place] [address]     [phone]      Admissions: 8am-3pm M-F  [facility] ([facility]) [address] [place], [place] [phone]  [facility] Program: Residential Program [phone](90 Days)   [place], [place]      [phone] or [phone]     [facility]: [facility] [place], [place] (Walk in Hours over the weekend  or by referral)  [facility] [address] [place], [place], [place] [address] [phone]  Crisis Mobile: [facility]:  [phone] (for crisis response 24 hours a day) [facility] Hotline:      [phone] Outpatient Psychiatry and Counseling  [facility]: Mobile Crisis Management 24 hours:  [phone]  [facility] sliding scale fee and walk in schedule: M-F 8am-12pm/1pm-3pm [address]  [place], [place] [address] [phone]  [facility] [address] [place], [place] [address] [phone]  [facility] ([facility] new patient walk-in appointments available Monday - Friday 8am -3pm.          [address] [place], [place] [address] [phone] or crisis line- [phone]  [facility] Services/ Intensive Outpatient Therapy Program [address] [place], [place] [address] [phone]  [facility]                  Crisis Services      [phone]      [address]     [place], [place] [address]                 [place] [facility]   [facility] [phone] [address] [place], [place] [address]   [facility]          [address] [place],  [place], [place] [address]       [phone]  [facility] [address] [place], [place] [address] [phone]  [facility]    [address]    [place], [place] [address]     [phone]       Andee PolesParish McKinney, MD     [address]  [place] [address]     [phone]       [facility] [address] [place] [address]  [facility]     [address] [place], [place]      [phone]       [facility] Eulogio Ditch, MD [address] [place], [place] [address] [phone]  [facility]     [address]     [place], [place] [address]     [phone]       [facility] [address] [place], [place] [address] [phone] Resources for Temporary Residential Assistance/Crisis Centers  [facility]) M-F 8am-3pm   [address] [place], [place] [address]   [phone] Services include: laundry, barbering, support groups, case management, phone  & computer access, showers, AA/NA mtgs, mental health/substance abuse nurse, job skills class, disability information, VA assistance, spiritual classes, etc.   HOMELESS SHELTERS  [facility]     [facility]   [address], [place]     [phone]              [employer] (women and children)       [address] [place], [place] [address] [phone] [email or web address] for application and process Application Required  [facility]   [address]    [place] [address]     [phone]                    [facility] [address] [place], [place] [address] [phone] application appt.) Application Required  [facility] (women only)    [address]     [place], [place] [address]     [phone]      Intake starts 6pm daily Need valid ID, SSC, & Police report [employer] [address] [place], [place] [phone] Application Required  [employer] (men only)     [address]      [place], [place]     [phone]       [facility] [place] (Pregnant women only) [address] [place], [place] [phone]  [facility]      [address]      [place], [place] [address]     [phone]             [facility] [address] [place], [place] [phone] 90 day commitment/SA/Application  process  [facility](men only)     [address]     [place], [place]     [phone]       Check-in at [facility] [address] [place], [place] [address] [phone] Men/Women/Women and Children must be there by 7 pm  [facility] [place], [place] [phone]

## 2015-03-12 NOTE — ED Notes (Signed)
Pt arrives to the ER via EMS for complaints of abd pain x 1 year; pt states that he had Cyclic vomiting syndrome and is allergic to gluten; pt states that he has vomited x 1 tonight

## 2015-03-12 NOTE — ED Notes (Signed)
Pt provided non-skid socks. After much coaching pt ambulated to restroom with tech.

## 2015-03-16 ENCOUNTER — Encounter (HOSPITAL_COMMUNITY): Payer: Self-pay | Admitting: Emergency Medicine

## 2015-03-16 ENCOUNTER — Emergency Department (HOSPITAL_COMMUNITY)
Admission: EM | Admit: 2015-03-16 | Discharge: 2015-03-16 | Discharge status: Against Medical Advice | Payer: Commercial Managed Care - PPO | Attending: Emergency Medicine | Admitting: Emergency Medicine

## 2015-03-16 DIAGNOSIS — Z720 Tobacco use: Secondary | ICD-10-CM | POA: Insufficient documentation

## 2015-03-16 DIAGNOSIS — Z760 Encounter for issue of repeat prescription: Secondary | ICD-10-CM | POA: Insufficient documentation

## 2015-03-16 NOTE — ED Notes (Signed)
Pt called x2 no answer 

## 2015-03-16 NOTE — ED Notes (Signed)
Pt called x1 for vitals. No answer 

## 2015-03-16 NOTE — ED Notes (Signed)
Per EMS, no complaint, states he needs a refill on his medication and wants to go back to behavioral health.

## 2015-03-16 NOTE — ED Notes (Signed)
Pt called x 3 no answer.  [employer] states that pt left.
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
## 2015-03-16 NOTE — ED Notes (Signed)
Pt went up to [employer] and said "How yall gonna give me a mother fucking banana pudding with no spoon"

## 2015-03-18 ENCOUNTER — Encounter (HOSPITAL_COMMUNITY): Payer: Self-pay | Admitting: Emergency Medicine

## 2015-03-18 ENCOUNTER — Emergency Department (HOSPITAL_COMMUNITY)
Admission: EM | Admit: 2015-03-18 | Discharge: 2015-03-20 | Disposition: A | Discharge status: Home / Self Care | Payer: Commercial Managed Care - PPO | Attending: Emergency Medicine | Admitting: Emergency Medicine

## 2015-03-18 DIAGNOSIS — Z87442 Personal history of urinary calculi: Secondary | ICD-10-CM | POA: Insufficient documentation

## 2015-03-18 DIAGNOSIS — F1210 Cannabis abuse, uncomplicated: Secondary | ICD-10-CM | POA: Diagnosis not present

## 2015-03-18 DIAGNOSIS — Z590 Homelessness: Secondary | ICD-10-CM | POA: Diagnosis not present

## 2015-03-18 DIAGNOSIS — F319 Bipolar disorder, unspecified: Secondary | ICD-10-CM | POA: Diagnosis not present

## 2015-03-18 DIAGNOSIS — K219 Gastro-esophageal reflux disease without esophagitis: Secondary | ICD-10-CM | POA: Diagnosis not present

## 2015-03-18 DIAGNOSIS — Z79899 Other long term (current) drug therapy: Secondary | ICD-10-CM | POA: Diagnosis not present

## 2015-03-18 DIAGNOSIS — R451 Restlessness and agitation: Secondary | ICD-10-CM | POA: Diagnosis not present

## 2015-03-18 DIAGNOSIS — Z8669 Personal history of other diseases of the nervous system and sense organs: Secondary | ICD-10-CM | POA: Diagnosis not present

## 2015-03-18 DIAGNOSIS — Z9049 Acquired absence of other specified parts of digestive tract: Secondary | ICD-10-CM | POA: Diagnosis not present

## 2015-03-18 DIAGNOSIS — Z046 Encounter for general psychiatric examination, requested by authority: Secondary | ICD-10-CM | POA: Diagnosis present

## 2015-03-18 DIAGNOSIS — F1220 Cannabis dependence, uncomplicated: Secondary | ICD-10-CM | POA: Diagnosis present

## 2015-03-18 DIAGNOSIS — R109 Unspecified abdominal pain: Secondary | ICD-10-CM | POA: Diagnosis not present

## 2015-03-18 DIAGNOSIS — R45851 Suicidal ideations: Secondary | ICD-10-CM | POA: Diagnosis not present

## 2015-03-18 DIAGNOSIS — Z720 Tobacco use: Secondary | ICD-10-CM | POA: Insufficient documentation

## 2015-03-18 HISTORY — DX: Brief psychotic disorder: F23

## 2015-03-18 HISTORY — DX: Bipolar disorder, unspecified: F31.9

## 2015-03-18 LAB — COMPREHENSIVE METABOLIC PANEL
ALBUMIN: 4.2 g/dL (ref 3.5–5.0)
ALK PHOS: 88 U/L (ref 38–126)
ALT: 21 U/L (ref 17–63)
AST: 36 U/L (ref 15–41)
Anion gap: 7 (ref 5–15)
BILIRUBIN TOTAL: 0.7 mg/dL (ref 0.3–1.2)
BUN: 16 mg/dL (ref 6–20)
CO2: 25 mmol/L (ref 22–32)
Calcium: 9.3 mg/dL (ref 8.9–10.3)
Chloride: 107 mmol/L (ref 101–111)
Creatinine, Ser: 0.84 mg/dL (ref 0.61–1.24)
GFR calc Af Amer: >60 mL/min (ref >60)
GFR calc non Af Amer: >60 mL/min (ref >60)
GLUCOSE: 108 mg/dL — AB (ref 65–99)
POTASSIUM: 3.7 mmol/L (ref 3.5–5.1)
Sodium: 139 mmol/L (ref 135–145)
TOTAL PROTEIN: 6.8 g/dL (ref 6.5–8.1)

## 2015-03-18 LAB — CBC
HEMATOCRIT: 33.2 % — AB (ref 39.0–52.0)
Hemoglobin: 11.3 g/dL — ABNORMAL LOW (ref 13.0–17.0)
MCH: 32.8 pg (ref 26.0–34.0)
MCHC: 34 g/dL (ref 30.0–36.0)
MCV: 96.2 fL (ref 78.0–100.0)
Platelets: 190 10*3/uL (ref 150–400)
RBC: 3.45 MIL/uL — ABNORMAL LOW (ref 4.22–5.81)
RDW: 12.6 % (ref 11.5–15.5)
WBC: 6.3 10*3/uL (ref 4.0–10.5)

## 2015-03-18 LAB — ACETAMINOPHEN LEVEL: Acetaminophen (Tylenol), Serum: <10 ug/mL — ABNORMAL LOW (ref 10–30)

## 2015-03-18 LAB — ETHANOL: Alcohol, Ethyl (B): <5 mg/dL (ref <5)

## 2015-03-18 LAB — SALICYLATE LEVEL: Salicylate Lvl: <4 mg/dL (ref 2.8–30.0)

## 2015-03-18 MED ORDER — ACETAMINOPHEN 325 MG PO TABS
650.0000 mg | ORAL_TABLET | ORAL | Status: DC | PRN
  Administered 2015-03-20: 650 mg via ORAL
  Filled 2015-03-18: qty 2

## 2015-03-18 MED ORDER — IBUPROFEN 200 MG PO TABS: 600.0000 mg | ORAL_TABLET | Freq: Three times a day (TID) | ORAL | Status: DC | PRN

## 2015-03-18 MED ORDER — ZOLPIDEM TARTRATE 5 MG PO TABS: 5.0000 mg | ORAL_TABLET | Freq: Every evening | ORAL | Status: DC | PRN

## 2015-03-18 MED ORDER — LORAZEPAM 1 MG PO TABS: 1.0000 mg | ORAL_TABLET | Freq: Three times a day (TID) | ORAL | Status: DC | PRN

## 2015-03-18 NOTE — ED Notes (Signed)
Pt oriented to room and unit.  He is mostly non verbal. His head is covered with a blanket and he refused to remove it.  He denies SI, HI, AVH.  15 minute checks and video monitoring continue.
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
## 2015-03-18 NOTE — ED Notes (Signed)
Pt  Has jeans, blue tennis shoes, black socks, red t-shirt and belt all placed in personal belongings bag at nurse station by room 15

## 2015-03-18 NOTE — ED Notes (Signed)
Pt sleeping at present, no distress noted, Monitoring for safety, Q 15 min checks in effect.

## 2015-03-18 NOTE — ED Notes (Signed)
Bed: [id number] Expected date:  Expected time:  Means of arrival:  Comments: gpd- [id number]

## 2015-03-18 NOTE — BH Assessment (Signed)
Writer informed Dr. Criss AlvineGoldston that patient was not alert and would not wake up to complete a TTS assessment. Writer informed Dr. Criss AlvineGoldston that patient's name was called loudly numerous times. Writer encouraged patient to wake up for his TTS assessment without success. Requested Dr. Criss AlvineGoldston to remove the TTS consult and reorder when patient is alert enough to participate.   Dr. Criss AlvineGoldston went to patient's room and was able to awaken patient by removing his covers. He informed this Clinical research associatewriter that patient is now awaken and ready to be assessed. Writer informed the Water engineeron-coming TTS worker.

## 2015-03-18 NOTE — BH Assessment (Signed)
[facility] Assessment Progress Note   Called and scheduled this pt's tele assessment with this clinician as well as gathered clinical information from EDP Goldston.  Attempted to see the pt and he would not wake up.  This clinician and another clinician attempted to wake the pt as well as the tech.  TTS to contact EDP to see if TTS consult can be taken out and performed when pt wakes.  Casimer LaniusKristen Jenika Chiem, MS, [facility] Therapeutic Triage Specialist [facility]
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
## 2015-03-18 NOTE — BH Assessment (Signed)
Assessment completed. Consulted Donell SievertSpencer Simon, PA-C who recommended inpatient treatment. TTS to seek placement. Informed Dr. Criss AlvineGoldston of recommendation.

## 2015-03-19 DIAGNOSIS — F319 Bipolar disorder, unspecified: Secondary | ICD-10-CM | POA: Diagnosis not present

## 2015-03-19 DIAGNOSIS — F1220 Cannabis dependence, uncomplicated: Secondary | ICD-10-CM | POA: Diagnosis not present

## 2015-03-19 DIAGNOSIS — R45851 Suicidal ideations: Secondary | ICD-10-CM

## 2015-03-19 LAB — RAPID URINE DRUG SCREEN, HOSP PERFORMED
Amphetamines: NOT DETECTED
BARBITURATES: NOT DETECTED
BENZODIAZEPINES: NOT DETECTED
Cocaine: NOT DETECTED
Opiates: NOT DETECTED
Tetrahydrocannabinol: POSITIVE — AB

## 2015-03-19 MED ORDER — NICOTINE 21 MG/24HR TD PT24
21.0000 mg | MEDICATED_PATCH | Freq: Once | TRANSDERMAL | Status: DC
  Administered 2015-03-19: 21 mg via TRANSDERMAL
  Filled 2015-03-19: qty 1

## 2015-03-19 MED ORDER — TRAZODONE HCL 50 MG PO TABS
50.0000 mg | ORAL_TABLET | Freq: Every evening | ORAL | Status: DC | PRN
  Administered 2015-03-19: 50 mg via ORAL
  Filled 2015-03-19: qty 1

## 2015-03-19 MED ORDER — OLANZAPINE 5 MG PO TBDP
5.0000 mg | ORAL_TABLET | Freq: Three times a day (TID) | ORAL | Status: DC | PRN
  Administered 2015-03-19: 5 mg via ORAL
  Filled 2015-03-19: qty 1

## 2015-03-19 MED ORDER — OLANZAPINE 10 MG IM SOLR: 5.0000 mg | Freq: Three times a day (TID) | INTRAMUSCULAR | Status: DC | PRN

## 2015-03-19 MED ORDER — RISPERIDONE 1 MG PO TABS
1.0000 mg | ORAL_TABLET | Freq: Every day | ORAL | Status: DC
  Administered 2015-03-19: 1 mg via ORAL
  Filled 2015-03-19: qty 1

## 2015-03-19 MED ORDER — HYDROXYZINE HCL 25 MG PO TABS: 25.0000 mg | ORAL_TABLET | Freq: Three times a day (TID) | ORAL | Status: DC | PRN

## 2015-03-19 NOTE — ED Notes (Signed)
Pt spoke with TTS and became very angry states "I will act out if I don't get to leave here", pt attempted to slam bathroom door, pt given personal care items for hygiene and is currently taking a shower, NP Renata Capriceonrad notified of patients behavior, new orders placed for prn medication for agitation.

## 2015-03-19 NOTE — ED Notes (Signed)
Pt AAO x 3, no distress noted, watching TV in dayroom at present.  Calm & cooperative at present.  Monitoring for safety, Q 15 min checks in effect.
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
## 2015-03-19 NOTE — B[facility] Counselor (Signed)
03/19/15: referral packet sent to [place], [facility], [place], [facility], [facility], [facility], [place], [facility], [place], [place], [place]** Note IVC papers will need to be added. Zaccheus Edmister K. Hannah BeatHarris, LPCA,LCASA, [facility]  Counselor 03/19/2015 7:26 AM

## 2015-03-19 NOTE — B[facility] Counselor (Signed)
Patient requested to speak with the is Clinical research associatewriter in the hallway stating that he was told by the psychiatrist that he would be discharged today. Counselor reviewed patients chart and spoke with Claudette Headonrad Withrow, DNP to verify patients disposition of meeting criteria. Counselor informed patient that he has been IVC'd and will remain in the ED as inpatient has been recommended at this time. Patient became upset and states "I'm going to tear shit up so i have a reason to be in here." Patient was encouraged to stay calm if he does not feel that he would be here and speak with the psychiatrist about his concerns tomorrow. Counselor asked patient about his history of taking his medications and patient states that he is Bipolar and does not need medications. Patient states "i can sleep on my own, I don't need that." Patient walked out the room stating that he would give "y'all a reason to keep me here." Informed patients nurse and the psych NP of patients statements.   Davina PokeJoVea Geanette Buonocore, LCSW Therapeutic Triage Specialist [facility] 03/19/2015 4:45 PM

## 2015-03-20 ENCOUNTER — Encounter (HOSPITAL_COMMUNITY): Payer: Self-pay | Admitting: Registered Nurse

## 2015-03-20 DIAGNOSIS — F1220 Cannabis dependence, uncomplicated: Secondary | ICD-10-CM | POA: Diagnosis not present

## 2015-03-20 DIAGNOSIS — F319 Bipolar disorder, unspecified: Secondary | ICD-10-CM | POA: Diagnosis not present

## 2015-03-20 MED ORDER — TRAZODONE HCL 50 MG PO TABS
50.0000 mg | ORAL_TABLET | Freq: Every evening | ORAL | Status: DC | PRN
Start: 2015-03-20 — End: 2021-01-21

## 2015-03-20 MED ORDER — RISPERIDONE 1 MG PO TABS: 1.0000 mg | ORAL_TABLET | Freq: Every day | ORAL | Status: DC

## 2015-03-20 NOTE — BH Assessment (Signed)
[facility] Assessment Progress Note  Per Thedore MinsMojeed Akintayo, MD, this pt does not require psychiatric hospitalization at this time.  He is to be released from petition and [facility].  IVC has been rescinded.  Discharge instructions include referral information for [facility].  Pt's nurse, Wille CelesteJanie, has been notified.   Doylene Canninghomas Denasia Venn, MA  Triage Specialist  [phone]

## 2015-03-20 NOTE — Consult Note (Signed)
[facility] Consult   Reason for Consult:  Bizarre behavior Referring Physician:  EDP Patient Identification: Erik Cowan MRN:  [id number] Principal Diagnosis: Bipolar 1 disorder ([place]) Diagnosis:   Patient Active Problem List   Diagnosis Date Noted  . Cannabis use disorder, severe, dependence ([place]) [F12.20] 03/19/2015  . Bipolar 1 disorder ([place]) [F31.9] 03/19/2015  . Heart murmur [R01.1] 05/23/2012  . Dehydration, mild [E86.0] 05/23/2012  . Hyponatremia [E87.1] 06/04/2011  . Hypokalemia [E87.6] 06/04/2011  . Leukocytosis [D72.829] 06/04/2011  . GERD (gastroesophageal reflux disease) [K21.9]   . Nephrolithiasis [N20.0]   . Cyclic vomiting syndrome [G43.A0]   . Abdominal pain, epigastric [R10.13] 06/10/2010    Total Time spent with patient: 45 minutes  Subjective:   Erik Cowan is a 29 y.o. male patient.  HPI:  Patient states that there is no reason for him to be in the hospital and that his family is lying on him.  States that he is homeless; he does use "weed".  Denies suicidal/homicidal ideation, psychosis, and paranoia.  States that he does have a history of psych hospitalization "My aunt told me to tell them that I wanted to kill myself and when I was ready to be discharged nobody wanted to let me come home with them.  Patient states that he is not on any psychiatric medication.  Patient states that his children lives with their mother and that he is not in a place to take care of them at this time.  Patient does state that he gets irritable at time when his family is trying to tell him what to do, but denies violence.     Past Psychiatric History: Inpatient psychiatric hospitalization x1  Risk to Self: Suicidal Ideation: Yes-Currently Present Suicidal Intent: No Is patient at risk for suicide?: No Suicidal Plan?: No Access to Means: No What has been your use of drugs/alcohol within the last 12 months?: Pt reported THC use.  How many times?:  ("A lot"  ) Other Self Harm Risks: No other self harm risk identified at this time.  Triggers for Past Attempts: Unknown Intentional Self Injurious Behavior: None Risk to Others: Homicidal Ideation: No Thoughts of Harm to Others: No Current Homicidal Intent: No Current Homicidal Plan: No Access to Homicidal Means: No Identified Victim: N/A History of harm to others?:  (Unable to assess) Assessment of Violence: None Noted Does patient have access to weapons?:  (Unable to assess) Criminal Charges Pending?:  (Unable to assess ) Does patient have a court date:  (Unable to assess ) Prior Inpatient Therapy: Prior Inpatient Therapy: Yes Prior Therapy Dates:  (Unable to assess ) Prior Therapy Facilty/Provider(s):  (Unable to assess ) Reason for Treatment:  (Unable to assess ) Prior Outpatient Therapy: Prior Outpatient Therapy: Yes Prior Therapy Dates:  (Unable to assess ) Prior Therapy Facilty/Provider(s):  (Unable to assess ) Reason for Treatment:  (Unable to assess ) Does patient have an ACCT team?: Unknown Does patient have Intensive In-House Services?  : No Does patient have [facility] services? : Unknown Does patient have [facility] services?: Unknown  Past Medical History:  Past Medical History  Diagnosis Date  . GERD (gastroesophageal reflux disease)   . Nephrolithiasis   . Cyclic vomiting syndrome   . Bipolar disorder ([place])   . Bipolar 1 disorder ([place])   . Schizophrenia, acute [facility])     Past Surgical History  Procedure Laterality Date  . Unremarkable    . Appendectomy     Family History:  Family History  Problem Relation Age of Onset  . Colon cancer Maternal Uncle   . Liver cancer Mother   . Diabetes Maternal Aunt   . Heart disease Paternal Grandfather   . Testicular cancer Brother    Family Psychiatric  History: He is unsure of family psych history Social History:  History  Alcohol Use  . Yes    Comment: occasional-once per month     History  Drug Use  . 7.00 per week     Comment: smokes marijuana daily    Social History   Social History  . Marital Status: Single    Spouse Name: N/A  . Number of Children: N/A  . Years of Education: N/A   Occupational History  . [employer]     [employer]   Social History Main Topics  . Smoking status: Current Every Day Smoker -- 1 years    Types: Cigarettes    Last Attempt to Quit: 06/04/2007  . Smokeless tobacco: Never Used     Comment: see illicit drug comment  . Alcohol Use: Yes     Comment: occasional-once per month  . Drug Use: 7.00 per week     Comment: smokes marijuana daily  . Sexual Activity: No   Other Topics Concern  . None   Social History Narrative   Single.  Lives with his Aunt. Works part-time as a Engineer, maintenance (IT).   Additional Social History:    History of alcohol / drug use?: Yes Name of Substance 1: THC  1 - Age of First Use:  (unable to assess ) 1 - Amount (size/oz):  (unable to assess ) 1 - Frequency:  (unable to assess ) 1 - Duration:  (unable to assess) 1 - Last Use / Amount: unable to assess     Allergies:   Allergies  Allergen Reactions  . Gluten Meal Nausea And Vomiting    Stomach pains  . Milk-Related Compounds Nausea And Vomiting    Stomach upset    Labs:  Results for orders placed or performed during the hospital encounter of 03/18/15 (from the past 48 hour(s))  Comprehensive metabolic panel     Status: Abnormal   Collection Time: 03/18/15  5:35 PM  Result Value Ref Range   Sodium 139 135 - 145 mmol/L   Potassium 3.7 3.5 - 5.1 mmol/L   Chloride 107 101 - 111 mmol/L   CO2 25 22 - 32 mmol/L   Glucose, Bld 108 (H) 65 - 99 mg/dL   BUN 16 6 - 20 mg/dL   Creatinine, Ser 0.84 0.61 - 1.24 mg/dL   Calcium 9.3 8.9 - 10.3 mg/dL   Total Protein 6.8 6.5 - 8.1 g/dL   Albumin 4.2 3.5 - 5.0 g/dL   AST 36 15 - 41 U/L   ALT 21 17 - 63 U/L   Alkaline Phosphatase 88 38 - 126 U/L   Total Bilirubin 0.7 0.3 - 1.2 mg/dL   GFR calc non Af Amer >60 >60 mL/min   GFR calc Af  Amer >60 >60 mL/min    Comment: (NOTE) The eGFR has been calculated using the CKD EPI equation. This calculation has not been validated in all clinical situations. eGFR's persistently <60 mL/min signify possible Chronic Kidney Disease.    Anion gap 7 5 - 15  CBC     Status: Abnormal   Collection Time: 03/18/15  5:35 PM  Result Value Ref Range   WBC 6.3 4.0 - 10.5 K/uL   RBC 3.45 (L) 4.22 -  5.81 MIL/uL   Hemoglobin 11.3 (L) 13.0 - 17.0 g/dL   HCT 33.2 (L) 39.0 - 52.0 %   MCV 96.2 78.0 - 100.0 fL   MCH 32.8 26.0 - 34.0 pg   MCHC 34.0 30.0 - 36.0 g/dL   RDW 12.6 11.5 - 15.5 %   Platelets 190 150 - 400 K/uL  Ethanol (ETOH)     Status: None   Collection Time: 03/18/15  5:36 PM  Result Value Ref Range   Alcohol, Ethyl (B) <5 <5 mg/dL    Comment:        LOWEST DETECTABLE LIMIT FOR SERUM ALCOHOL IS 5 mg/dL FOR MEDICAL PURPOSES ONLY   Salicylate level     Status: None   Collection Time: 03/18/15  5:36 PM  Result Value Ref Range   Salicylate Lvl <1.5 2.8 - 30.0 mg/dL  Acetaminophen level     Status: Abnormal   Collection Time: 03/18/15  5:36 PM  Result Value Ref Range   Acetaminophen (Tylenol), Serum <10 (L) 10 - 30 ug/mL    Comment:        THERAPEUTIC CONCENTRATIONS VARY SIGNIFICANTLY. A RANGE OF 10-30 ug/mL MAY BE AN EFFECTIVE CONCENTRATION FOR MANY PATIENTS. HOWEVER, SOME ARE BEST TREATED AT CONCENTRATIONS OUTSIDE THIS RANGE. ACETAMINOPHEN CONCENTRATIONS >150 ug/mL AT 4 HOURS AFTER INGESTION AND >50 ug/mL AT 12 HOURS AFTER INGESTION ARE OFTEN ASSOCIATED WITH TOXIC REACTIONS.   Urine rapid drug screen (hosp performed) (Not at [facility])     Status: Abnormal   Collection Time: 03/19/15  9:28 AM  Result Value Ref Range   Opiates NONE DETECTED NONE DETECTED   Cocaine NONE DETECTED NONE DETECTED   Benzodiazepines NONE DETECTED NONE DETECTED   Amphetamines NONE DETECTED NONE DETECTED   Tetrahydrocannabinol POSITIVE (A) NONE DETECTED   Barbiturates NONE DETECTED NONE DETECTED     Comment:        DRUG SCREEN FOR MEDICAL PURPOSES ONLY.  IF CONFIRMATION IS NEEDED FOR ANY PURPOSE, NOTIFY LAB WITHIN 5 DAYS.        LOWEST DETECTABLE LIMITS FOR URINE DRUG SCREEN Drug Class       Cutoff (ng/mL) Amphetamine      1000 Barbiturate      200 Benzodiazepine   056 Tricyclics       979 Opiates          300 Cocaine          300 THC              50     Current Facility-Administered Medications  Medication Dose Route Frequency Provider Last Rate Last Dose  . acetaminophen (TYLENOL) tablet 650 mg  650 mg Oral Q4H PRN Sherwood Gambler, MD   650 mg at 03/20/15 0824  . hydrOXYzine (ATARAX/VISTARIL) tablet 25 mg  25 mg Oral TID PRN Penne Rosenstock      . ibuprofen (ADVIL,MOTRIN) tablet 600 mg  600 mg Oral Q8H PRN Sherwood Gambler, MD      . nicotine (NICODERM CQ - dosed in mg/24 hours) patch 21 mg  21 mg Transdermal Once Benjamine Mola, FNP   21 mg at 03/19/15 1721  . OLANZapine zydis (ZYPREXA) disintegrating tablet 5 mg  5 mg Oral Q8H PRN Benjamine Mola, FNP   5 mg at 03/19/15 1710   Or  . OLANZapine (ZYPREXA) injection 5 mg  5 mg Intramuscular Q8H PRN Benjamine Mola, FNP      . risperiDONE (RISPERDAL) tablet 1 mg  1 mg Oral QHS  Abreanna Drawdy   1 mg at 03/19/15 2124  . traZODone (DESYREL) tablet 50 mg  50 mg Oral QHS PRN Daysy Santini   50 mg at 03/19/15 2124   Current Outpatient Prescriptions  Medication Sig Dispense Refill  . ibuprofen (ADVIL,MOTRIN) 800 MG tablet Take 1 tablet (800 mg total) by mouth 3 (three) times daily. 21 tablet 0  . omeprazole (PRILOSEC) 20 MG capsule Take 1 capsule (20 mg total) by mouth daily. (Patient not taking: Reported on 03/19/2015) 30 capsule 0  . ondansetron (ZOFRAN) 4 MG tablet Take 1 tablet (4 mg total) by mouth every 6 (six) hours. (Patient not taking: Reported on 03/19/2015) 12 tablet 0  . QUEtiapine (SEROQUEL) 200 MG tablet Take 1 tablet by mouth every evening. For 7 days (free med)    . QUEtiapine (SEROQUEL) 50 MG tablet Take 1 tablet  by mouth daily. For 7 days (free med)    . sucralfate (CARAFATE) 1 G tablet Take 1 tablet (1 g total) by mouth 4 (four) times daily -  with meals and at bedtime. (Patient not taking: Reported on 03/19/2015) 12 tablet 0    Musculoskeletal: Strength & Muscle Tone: within normal limits Gait & Station: normal Patient leans: N/A  Psychiatric Specialty Exam: Review of Systems  Psychiatric/Behavioral: Depression: Denies. Suicidal ideas: Denies. Hallucinations: Denies. Memory loss: Denies. Nervous/anxious: Denies. Insomnia: Denies.   All other systems reviewed and are negative.   Blood pressure 129/72, pulse 57, temperature 98.4 F (36.9 C), temperature source Oral, resp. rate 22, SpO2 100 %.There is no weight on file to calculate BMI.  General Appearance: Casual  Eye Contact::  Good  Speech:  Clear and Coherent and Normal Rate  Volume:  Normal  Mood:  Anxious  Affect:  Congruent  Thought Process:  Circumstantial and Goal Directed  Orientation:  Full (Time, Place, and Person)  Thought Content:  Denies hallucinations, delusions, and paranoia  Suicidal Thoughts:  No  Homicidal Thoughts:  No  Memory:  Immediate;   Good Recent;   Good Remote;   Good  Judgement:  Fair  Insight:  Present  Psychomotor Activity:  Normal  Concentration:  Fair  Recall:  Good  Fund of Knowledge:Good  Language: Good  Akathisia:  No  Handed:  Right  AIMS (if indicated):     Assets:  Communication Skills Desire for Improvement Social Support  ADL's:  Intact  Cognition: WNL  Sleep:      Treatment Plan Summary: Daily contact with patient to assess and evaluate symptoms and progress in treatment and Medication management  Disposition: No evidence of imminent risk to self or others at present.   Patient does not meet criteria for psychiatric inpatient admission. Discussed crisis plan, support from social network, calling 911, coming to the Emergency Department, and calling Suicide Hotline. Referral to  outpatient services   Discharge home.  Patient to follow up with Nicholaus Bloom, Shuvon, FNP-BC 03/20/2015 11:12 AM Patient seen face-to-face for psychiatric evaluation, chart reviewed and case discussed with the physician extender and developed treatment plan. Reviewed the information documented and agree with the treatment plan. Corena Pilgrim, MD

## 2015-03-20 NOTE — ED Notes (Signed)
Patient reports being ready for discharge.  Patient denies pain.  Discharge information is explained to the patient and he voices understanding.  Prescriptions were given to the patient.  Written discharge instructions and prescriptions were reviewed with the patient.  Patient instructed to follow up with [facility] this week.  Patient encouraged to take medications as directed and to return and/or seek treatment for any suicidal or homicidal thoughts.  Patient verbalized understanding.  Patient ambulatory without difficulty to discharge area.  Belongings returned after leaving the unit.

## 2015-03-20 NOTE — ED Notes (Signed)
Up walking in the hall, IVC has been rescinded, pt to be dc'd home

## 2015-03-20 NOTE — ED Notes (Addendum)
Patient is calm and cooperative with interview this a.m.  Patient denies suicidal ideation, auditory or visual hallucinations at this time.  He states that he would like to hurt the people who keep lying about him and got him placed in the hospital.  When asked if he plans to hurt anyone, patient states, "No."  Patient states "I don't want people telling me what to do."  Patient expresses frustration that his family will not help him do the things he needs to do like letting him borrow their car so he can go see his daughter.  Patient states that he does not need to be here and is only here because people are telling lies about him.  Patient contracts for safety.  Patient ate his entire breakfast and requested more food.  He was given Mal[place]urkey, cheese and crackers.

## 2015-03-20 NOTE — Discharge Instructions (Signed)
For your ongoing mental health needs, you are advised to follow up with [facility].  If you do not currently have an appointment scheduled, new and returning patients are seen at their walk-in clinic.  Walk-in hours are Monday - Friday from 8:00 am - 3:00 pm.  Walk-in patients are seen on a first come, first served basis.  Try to arrive as early as possible for he best chance of being seen the same day: ° °     [facility] °     [address] °     [place], [place] [address] °     [phone] °

## 2015-03-20 NOTE — ED Notes (Signed)
Dr Bridgette HabermannA and Shuvon NP into see.  Pt denies si/hi/avh at this time.  Pt reports that he is homeless, and is trying to get a job.  Pt sts that he would never hurt his self, but does reports he gets angry with the "people that keep lying about me."

## 2015-03-20 NOTE — [facility] Suicide Risk Assessment (Cosign Needed)
Suicide Risk Assessment  Discharge Assessment   [facility]   Demographic Factors:  Male  Total Time spent with patient: 20 minutes  Musculoskeletal: Strength & Muscle Tone: within normal limits Gait & Station: normal Patient leans: Right  Psychiatric Specialty Exam:  See Consult Note     Blood pressure 129/72, pulse 57, temperature 98.4 F (36.9 C), temperature source Oral, resp. rate 22, SpO2 100 %.There is no weight on file to calculate BMI.     Has this patient used any form of tobacco in the last 30 days? (Cigarettes, Smokeless Tobacco, Cigars, and/or Pipes) Yes, A prescription for an FDA-approved tobacco cessation medication was offered at discharge and the patient refused  Mental Status Per Nursing Assessment::   On Admission:     Current Mental Status by Physician: Denies suicidal/homicidal ideation, hallucinations, delusions, and paranoia  Loss Factors: Financial problems/change in socioeconomic status  Historical Factors: Family history of mental illness or substance abuse and Impulsivity  Risk Reduction Factors:   Positive social support  Continued Clinical Symptoms:  Alcohol/Substance Abuse/Dependencies  Cognitive Features That Contribute To Risk:  Closed-mindedness    Suicide Risk:  Minimal: No identifiable suicidal ideation.  Patients presenting with no risk factors but with morbid ruminations; may be classified as minimal risk based on the severity of the depressive symptoms  Principal Problem: Bipolar 1 disorder [facility](HCC) Discharge Diagnoses:  Patient Active Problem List   Diagnosis Date Noted  . Cannabis use disorder, severe, dependence ([facility]) [F12.20] 03/19/2015  . Bipolar 1 disorder ([facility]) [F31.9] 03/19/2015  . Heart murmur [R01.1] 05/23/2012  . Dehydration, mild [E86.0] 05/23/2012  . Hyponatremia [E87.1] 06/04/2011  . Hypokalemia [E87.6] 06/04/2011  . Leukocytosis [D72.829] 06/04/2011  . GERD (gastroesophageal reflux  disease) [K21.9]   . Nephrolithiasis [N20.0]   . Cyclic vomiting syndrome [G43.A0]   . Abdominal pain, epigastric [R10.13] 06/10/2010      Plan Of Care/Follow-up recommendations:  Activity:  As tolerated Diet:  As tolerated Other:  Follow up with [facility]  Is patient on multiple antipsychotic therapies at discharge:  No   Has Patient had three or more failed trials of antipsychotic monotherapy by history:  No  Recommended Plan for Multiple Antipsychotic Therapies: NA    Rankin, Shuvon, FNP-BC 03/20/2015, 11:25 AM

## 2015-04-04 ENCOUNTER — Emergency Department (HOSPITAL_COMMUNITY): Payer: Commercial Managed Care - PPO

## 2015-04-04 ENCOUNTER — Emergency Department (HOSPITAL_COMMUNITY)
Admission: EM | Admit: 2015-04-04 | Discharge: 2015-04-05 | Disposition: A | Discharge status: Home / Self Care | Payer: Commercial Managed Care - PPO | Attending: Emergency Medicine | Admitting: Emergency Medicine

## 2015-04-04 ENCOUNTER — Encounter (HOSPITAL_COMMUNITY): Payer: Self-pay | Admitting: Emergency Medicine

## 2015-04-04 DIAGNOSIS — Z87442 Personal history of urinary calculi: Secondary | ICD-10-CM | POA: Diagnosis not present

## 2015-04-04 DIAGNOSIS — Z8669 Personal history of other diseases of the nervous system and sense organs: Secondary | ICD-10-CM | POA: Diagnosis not present

## 2015-04-04 DIAGNOSIS — Z79899 Other long term (current) drug therapy: Secondary | ICD-10-CM | POA: Insufficient documentation

## 2015-04-04 DIAGNOSIS — R1033 Periumbilical pain: Secondary | ICD-10-CM | POA: Diagnosis present

## 2015-04-04 DIAGNOSIS — K2970 Gastritis, unspecified, without bleeding: Secondary | ICD-10-CM | POA: Diagnosis not present

## 2015-04-04 DIAGNOSIS — F319 Bipolar disorder, unspecified: Secondary | ICD-10-CM | POA: Insufficient documentation

## 2015-04-04 DIAGNOSIS — F209 Schizophrenia, unspecified: Secondary | ICD-10-CM | POA: Diagnosis not present

## 2015-04-04 DIAGNOSIS — Z720 Tobacco use: Secondary | ICD-10-CM | POA: Diagnosis not present

## 2015-04-04 LAB — COMPREHENSIVE METABOLIC PANEL
ALK PHOS: 85 U/L (ref 38–126)
ALT: 68 U/L — AB (ref 17–63)
AST: 36 U/L (ref 15–41)
Albumin: 5 g/dL (ref 3.5–5.0)
Anion gap: 9 (ref 5–15)
BILIRUBIN TOTAL: 1 mg/dL (ref 0.3–1.2)
BUN: 21 mg/dL — AB (ref 6–20)
CALCIUM: 10.1 mg/dL (ref 8.9–10.3)
CHLORIDE: 97 mmol/L — AB (ref 101–111)
CO2: 30 mmol/L (ref 22–32)
CREATININE: 1.07 mg/dL (ref 0.61–1.24)
Glucose, Bld: 125 mg/dL — ABNORMAL HIGH (ref 65–99)
Potassium: 4.5 mmol/L (ref 3.5–5.1)
Sodium: 136 mmol/L (ref 135–145)
TOTAL PROTEIN: 8.4 g/dL — AB (ref 6.5–8.1)

## 2015-04-04 LAB — LIPASE, BLOOD: Lipase: 20 U/L (ref 11–51)

## 2015-04-04 LAB — CBC
HCT: 39.9 % (ref 39.0–52.0)
Hemoglobin: 13.8 g/dL (ref 13.0–17.0)
MCH: 33.7 pg (ref 26.0–34.0)
MCHC: 34.6 g/dL (ref 30.0–36.0)
MCV: 97.3 fL (ref 78.0–100.0)
PLATELETS: 216 10*3/uL (ref 150–400)
RBC: 4.1 MIL/uL — AB (ref 4.22–5.81)
RDW: 12.5 % (ref 11.5–15.5)
WBC: 12.3 10*3/uL — AB (ref 4.0–10.5)

## 2015-04-04 MED ORDER — PANTOPRAZOLE SODIUM 40 MG IV SOLR
40.0000 mg | Freq: Once | INTRAVENOUS | Status: AC
  Administered 2015-04-04: 40 mg via INTRAVENOUS
  Filled 2015-04-04: qty 40

## 2015-04-04 MED ORDER — HYDROMORPHONE HCL 1 MG/ML IJ SOLN
1.0000 mg | Freq: Once | INTRAMUSCULAR | Status: AC
  Administered 2015-04-04: 1 mg via INTRAVENOUS
  Filled 2015-04-04: qty 1

## 2015-04-04 MED ORDER — ONDANSETRON HCL 4 MG/2ML IJ SOLN
4.0000 mg | Freq: Once | INTRAMUSCULAR | Status: AC | PRN
  Administered 2015-04-04: 4 mg via INTRAVENOUS
  Filled 2015-04-04: qty 2

## 2015-04-04 MED ORDER — MORPHINE SULFATE (PF) 4 MG/ML IV SOLN
6.0000 mg | Freq: Once | INTRAVENOUS | Status: AC
  Administered 2015-04-04: 6 mg via INTRAVENOUS
  Filled 2015-04-04: qty 2

## 2015-04-04 MED ORDER — PROMETHAZINE HCL 25 MG/ML IJ SOLN
25.0000 mg | Freq: Once | INTRAMUSCULAR | Status: AC
  Administered 2015-04-05: 25 mg via INTRAVENOUS
  Filled 2015-04-04 (×2): qty 1

## 2015-04-04 MED ORDER — ONDANSETRON HCL 4 MG/2ML IJ SOLN
4.0000 mg | Freq: Once | INTRAMUSCULAR | Status: AC
  Administered 2015-04-04: 4 mg via INTRAVENOUS
  Filled 2015-04-04: qty 2

## 2015-04-04 MED ORDER — SODIUM CHLORIDE 0.9 % IV BOLUS (SEPSIS)
1000.0000 mL | Freq: Once | INTRAVENOUS | Status: AC
  Administered 2015-04-04: 1000 mL via INTRAVENOUS

## 2015-04-04 NOTE — ED Provider Notes (Signed)
CSN: [id number]     Arrival date & time 04/04/15  1724 History   First MD Initiated Contact with Patient 04/04/15 2022     Chief Complaint  Patient presents with  . Abdominal Pain     (Consider location/radiation/quality/duration/timing/severity/associated sxs/prior Treatment) HPI Erik Cowan is a 29 yo male with history of gastritis and GERD who presents to the Emergency Department for evaluation of abdominal pain and hematemesis. The pt states symptoms started 2.5 days ago. He describes burning, sharp, intermittent pain in the periumbilical region. States he has been vomiting blood 50-60 times per day. Endorses nausea, acid reflux, dizziness, hematuria, dysuria, flank pain, chills, fatigue. Reports central chest pain, but states it is associated with vomiting. He also notes a syncopal episode after showering, but vital signs are stable today. Denies shortness of breath. Pt has a 9 pack year smoking history. Pt also notes he has a gluten allergy and he had bread a couple days ago.  Past Medical History  Diagnosis Date  . GERD (gastroesophageal reflux disease)   . Nephrolithiasis   . Cyclic vomiting syndrome   . Bipolar disorder (HCC)   . Bipolar 1 disorder (HCC)   . Schizophrenia, acute [facility])    Past Surgical History  Procedure Laterality Date  . Unremarkable    . Appendectomy     Family History  Problem Relation Age of Onset  . Colon cancer Maternal Uncle   . Liver cancer Mother   . Diabetes Maternal Aunt   . Heart disease Paternal Grandfather   . Testicular cancer Brother    Social History  Substance Use Topics  . Smoking status: Current Every Day Smoker -- 1 years    Types: Cigarettes    Last Attempt to Quit: 06/04/2007  . Smokeless tobacco: Never Used     Comment: see illicit drug comment  . Alcohol Use: Yes     Comment: occasional-once per month    Review of Systems All other systems negative except as documented in the HPI. All pertinent positives and  negatives as reviewed in the HPI.  Allergies  Gluten meal and Milk-related compounds  Home Medications   Prior to Admission medications   Medication Sig Start Date End Date Taking? Authorizing Provider  risperiDONE (RISPERDAL) 1 MG tablet Take 1 tablet (1 mg total) by mouth at bedtime. 03/20/15  Yes Shuvon B Rankin, NP  traZODone (DESYREL) 50 MG tablet Take 1 tablet (50 mg total) by mouth at bedtime as needed for sleep. 03/20/15  Yes Shuvon B Rankin, NP   BP 111/61 mmHg  Pulse 58  Temp(Src) 97.8 F (36.6 C) (Oral)  Resp 16  SpO2 99% Physical Exam  Constitutional: He is oriented to person, place, and time.  29 yo pt lying in bed, appears to be in pain and discomfort.   HENT:  Head: Normocephalic and atraumatic.  Mouth/Throat: Oropharynx is clear and moist.  Eyes: Conjunctivae are normal. Pupils are equal, round, and reactive to light. No scleral icterus.  Neck: Normal range of motion. Neck supple.  Cardiovascular: Normal rate, regular rhythm and normal heart sounds.  Exam reveals no gallop and no friction rub.   No murmur heard. Pulmonary/Chest: Effort normal and breath sounds normal. No respiratory distress. He has no wheezes. He has no rales.  Abdominal: Soft. Bowel sounds are normal. He exhibits no distension and no mass. There is tenderness in the periumbilical area. There is no rebound and no guarding.  Musculoskeletal: Normal range of motion.  Neurological: He is  alert and oriented to person, place, and time.  Skin: Skin is warm and dry.  Psychiatric: He has a normal mood and affect. His behavior is normal.  Nursing note and vitals reviewed.   ED Course  Procedures (including critical care time) Labs Review Labs Reviewed  COMPREHENSIVE METABOLIC PANEL - Abnormal; Notable for the following:    Chloride 97 (*)    Glucose, Bld 125 (*)    BUN 21 (*)    Total Protein 8.4 (*)    ALT 68 (*)    All other components within normal limits  CBC - Abnormal; Notable for the  following:    WBC 12.3 (*)    RBC 4.10 (*)    All other components within normal limits  LIPASE, BLOOD  URINALYSIS, ROUTINE W REFLEX MICROSCOPIC (NOT AT [facility])    Imaging Review Ct Abdomen Pelvis W Contrast  04/04/2015  CLINICAL DATA:  Abdominal pain after being kneed in the stomach during basketball 2 days ago. EXAM: CT ABDOMEN AND PELVIS WITH CONTRAST TECHNIQUE: Multidetector CT imaging of the abdomen and pelvis was performed using the standard protocol following bolus administration of intravenous contrast. CONTRAST:  100 mL Omnipaque 300 intravenous COMPARISON:  03/01/2014 FINDINGS: There are normal appearances of the liver, gallbladder, pancreas, spleen, adrenals and kidneys. Bowel is unremarkable. The abdominal aorta is normal in caliber. There is no atherosclerotic calcification. There is no adenopathy in the abdomen or pelvis. There is no peritoneal blood or free air. Mild fluid density in the pelvis just to the right of the rectum is unchanged compared to the prior studies of 03/01/2014 and 06/04/2011 and does not have the appearance of acute blood. No significant abnormality in the lower chest. No evidence of acute fracture or other significant musculoskeletal abnormality. IMPRESSION: Negative for significant traumatic injury in the abdomen or pelvis. Electronically Signed   By: Ellery Plunkaniel R Mitchell M.D.   On: 04/04/2015 22:59   I have personally reviewed and evaluated these images and lab results as part of my medical decision-making.   EKG Interpretation None      11:12PM Pt continues to complain of abdominal pain and nausea. States pain is 8/10. Vomited 3 times in the hospital. Dilaudid and Phenergan ordered. 12:44PM Pt states he is feeling better.   Patient be referred to GI.  He has had normal vital signs and emergency department.  He is advised return here as needed.  The patient states he feels better at this time and would like to be discharged home.  Advised him to increase his  fluid intake, rest as much as possible   Charlestine NightChristopher Keta [place], PA-C 04/05/15 [id number]  Lyndal Pulleyaniel Knott, MD 04/05/15 [phone]

## 2015-04-04 NOTE — ED Notes (Signed)
Per pt, states abdominal pain as a result from being kneed in stomach playing basketball-states he vomited blood

## 2015-04-05 MED ORDER — PROMETHAZINE HCL 25 MG PO TABS: 25.0000 mg | ORAL_TABLET | Freq: Three times a day (TID) | ORAL | Status: DC | PRN

## 2015-04-05 MED ORDER — HYDROCODONE-ACETAMINOPHEN 5-325 MG PO TABS: 1.0000 | ORAL_TABLET | Freq: Four times a day (QID) | ORAL | Status: DC | PRN

## 2015-04-05 MED ORDER — ESOMEPRAZOLE MAGNESIUM 40 MG PO CPDR: 40.0000 mg | DELAYED_RELEASE_CAPSULE | Freq: Every day | ORAL | Status: DC

## 2015-04-05 MED ORDER — SUCRALFATE 1 G PO TABS: 1.0000 g | ORAL_TABLET | Freq: Three times a day (TID) | ORAL | Status: DC

## 2015-04-05 NOTE — ED Notes (Signed)
Pt. Reminded on collecting urine for U/A , verbalized understanding.

## 2015-04-05 NOTE — Discharge Instructions (Signed)
Return here as needed.  Follow-up with the GI Dr. provided.  Increase your fluid intake

## 2015-04-22 ENCOUNTER — Telehealth: Payer: Self-pay | Admitting: *Deleted

## 2015-04-22 ENCOUNTER — Emergency Department (HOSPITAL_COMMUNITY)
Admission: EM | Admit: 2015-04-22 | Discharge: 2015-04-22 | Disposition: A | Discharge status: Home / Self Care | Payer: Commercial Managed Care - PPO | Attending: Emergency Medicine | Admitting: Emergency Medicine

## 2015-04-22 ENCOUNTER — Encounter (HOSPITAL_COMMUNITY): Payer: Self-pay | Admitting: Emergency Medicine

## 2015-04-22 ENCOUNTER — Emergency Department (HOSPITAL_COMMUNITY)
Admission: EM | Admit: 2015-04-22 | Discharge: 2015-04-22 | Disposition: A | Discharge status: Home / Self Care | Payer: No Typology Code available for payment source

## 2015-04-22 DIAGNOSIS — R1013 Epigastric pain: Secondary | ICD-10-CM | POA: Diagnosis not present

## 2015-04-22 DIAGNOSIS — F1220 Cannabis dependence, uncomplicated: Secondary | ICD-10-CM | POA: Insufficient documentation

## 2015-04-22 DIAGNOSIS — Z79899 Other long term (current) drug therapy: Secondary | ICD-10-CM | POA: Diagnosis not present

## 2015-04-22 DIAGNOSIS — F319 Bipolar disorder, unspecified: Secondary | ICD-10-CM | POA: Diagnosis not present

## 2015-04-22 DIAGNOSIS — Z87442 Personal history of urinary calculi: Secondary | ICD-10-CM | POA: Insufficient documentation

## 2015-04-22 DIAGNOSIS — R112 Nausea with vomiting, unspecified: Secondary | ICD-10-CM | POA: Diagnosis present

## 2015-04-22 DIAGNOSIS — F209 Schizophrenia, unspecified: Secondary | ICD-10-CM | POA: Insufficient documentation

## 2015-04-22 DIAGNOSIS — F17210 Nicotine dependence, cigarettes, uncomplicated: Secondary | ICD-10-CM | POA: Insufficient documentation

## 2015-04-22 DIAGNOSIS — Z91040 Latex allergy status: Secondary | ICD-10-CM | POA: Insufficient documentation

## 2015-04-22 DIAGNOSIS — E860 Dehydration: Secondary | ICD-10-CM | POA: Diagnosis not present

## 2015-04-22 DIAGNOSIS — K219 Gastro-esophageal reflux disease without esophagitis: Secondary | ICD-10-CM | POA: Diagnosis not present

## 2015-04-22 LAB — COMPREHENSIVE METABOLIC PANEL
ALT: 25 U/L (ref 17–63)
AST: 28 U/L (ref 15–41)
Albumin: 5.1 g/dL — ABNORMAL HIGH (ref 3.5–5.0)
Alkaline Phosphatase: 83 U/L (ref 38–126)
Anion gap: 10 (ref 5–15)
BUN: 14 mg/dL (ref 6–20)
CHLORIDE: 97 mmol/L — AB (ref 101–111)
CO2: 28 mmol/L (ref 22–32)
CREATININE: 1.01 mg/dL (ref 0.61–1.24)
Calcium: 9.8 mg/dL (ref 8.9–10.3)
Glucose, Bld: 124 mg/dL — ABNORMAL HIGH (ref 65–99)
Potassium: 3.7 mmol/L (ref 3.5–5.1)
Sodium: 135 mmol/L (ref 135–145)
Total Bilirubin: 0.8 mg/dL (ref 0.3–1.2)
Total Protein: 8.4 g/dL — ABNORMAL HIGH (ref 6.5–8.1)

## 2015-04-22 LAB — CBC
HEMATOCRIT: 40.4 % (ref 39.0–52.0)
Hemoglobin: 13.9 g/dL (ref 13.0–17.0)
MCH: 33.4 pg (ref 26.0–34.0)
MCHC: 34.4 g/dL (ref 30.0–36.0)
MCV: 97.1 fL (ref 78.0–100.0)
PLATELETS: 174 10*3/uL (ref 150–400)
RBC: 4.16 MIL/uL — ABNORMAL LOW (ref 4.22–5.81)
RDW: 12.3 % (ref 11.5–15.5)
WBC: 10.1 10*3/uL (ref 4.0–10.5)

## 2015-04-22 LAB — LIPASE, BLOOD: LIPASE: 34 U/L (ref 11–51)

## 2015-04-22 MED ORDER — MORPHINE SULFATE (PF) 4 MG/ML IV SOLN
4.0000 mg | Freq: Once | INTRAVENOUS | Status: AC
  Administered 2015-04-22: 4 mg via INTRAVENOUS
  Filled 2015-04-22: qty 1

## 2015-04-22 MED ORDER — LORAZEPAM 2 MG/ML IJ SOLN
1.0000 mg | Freq: Once | INTRAMUSCULAR | Status: AC
  Administered 2015-04-22: 1 mg via INTRAVENOUS
  Filled 2015-04-22: qty 1

## 2015-04-22 MED ORDER — SUCRALFATE 1 G PO TABS: 1.0000 g | ORAL_TABLET | Freq: Three times a day (TID) | ORAL | Status: DC

## 2015-04-22 MED ORDER — PROMETHAZINE HCL 25 MG PO TABS: 25.0000 mg | ORAL_TABLET | Freq: Three times a day (TID) | ORAL | Status: DC | PRN

## 2015-04-22 MED ORDER — METOCLOPRAMIDE HCL 5 MG/ML IJ SOLN
10.0000 mg | Freq: Once | INTRAMUSCULAR | Status: AC
  Administered 2015-04-22: 10 mg via INTRAVENOUS
  Filled 2015-04-22: qty 2

## 2015-04-22 MED ORDER — ONDANSETRON 4 MG PO TBDP: 4.0000 mg | ORAL_TABLET | Freq: Once | ORAL | Status: DC

## 2015-04-22 MED ORDER — SODIUM CHLORIDE 0.9 % IV BOLUS (SEPSIS)
1000.0000 mL | Freq: Once | INTRAVENOUS | Status: AC
  Administered 2015-04-22: 1000 mL via INTRAVENOUS

## 2015-04-22 MED ORDER — ONDANSETRON HCL 4 MG/2ML IJ SOLN
4.0000 mg | Freq: Once | INTRAMUSCULAR | Status: AC
  Administered 2015-04-22: 4 mg via INTRAVENOUS
  Filled 2015-04-22: qty 2

## 2015-04-22 MED ORDER — ESOMEPRAZOLE MAGNESIUM 40 MG PO CPDR: 40.0000 mg | DELAYED_RELEASE_CAPSULE | Freq: Every day | ORAL | Status: DC

## 2015-04-22 NOTE — Progress Notes (Signed)
ED CM contacted by Triage Rn when pt re checked [facility] and request medication assistance  Pt with united health care coverage therefore there is NOT a [facility] program to assist with co pays of medication  Cm noted pt d/c nexium, carafate, hydrocodone and phenergan CM walked from [facility] to Triage and found pt to have left per triage RN after his "ride arrived Pt had been informed by Triage RN staff that there was not a CHS program to assist him prior to him leaving Pt reported not to have other concerns

## 2015-04-22 NOTE — ED Notes (Signed)
Pt aware of urine sample state he would need fluid

## 2015-04-22 NOTE — ED Notes (Signed)
Pt sleeping at this time.

## 2015-04-22 NOTE — ED Notes (Signed)
Per pt, states has vomited 40 times since last night, states he cant keep anything down-states blood in emesis

## 2015-04-22 NOTE — ED Provider Notes (Signed)
CSN: [id number]     Arrival date & time 04/22/15  [id number] History   First MD Initiated Contact with Patient 04/22/15 1011     Chief Complaint  Patient presents with  . Emesis     HPI Patient presents to the emergency department for nausea and vomiting.  He states he is unable to keep anything down.  He's had loose stools.  He has long-standing history of recurrent gastritis and gastroesophageal reflux disease as well as recurrent nausea vomiting.  He is followed by Dr. Wanita ChamberlainKappel with GI.  He's never had a clear diagnosis.  He does admit to heavy and excessive cannabis use.  As reported that the patient also has a gluten allergy.  He denies fevers and chills.  No chest pain shortness of breath.  Denies hematemesis.  Denies melena and hematochezia.  He reports sharp epigastric discomfort.  He denies daily alcohol use.  No prior history of pancreatitis.   Past Medical History  Diagnosis Date  . GERD (gastroesophageal reflux disease)   . Nephrolithiasis   . Cyclic vomiting syndrome   . Bipolar disorder (HCC)   . Bipolar 1 disorder (HCC)   . Schizophrenia, acute [facility](HCC)    Past Surgical History  Procedure Laterality Date  . Unremarkable    . Appendectomy     Family History  Problem Relation Age of Onset  . Colon cancer Maternal Uncle   . Liver cancer Mother   . Diabetes Maternal Aunt   . Heart disease Paternal Grandfather   . Testicular cancer Brother    Social History  Substance Use Topics  . Smoking status: Current Every Day Smoker -- 1 years    Types: Cigarettes    Last Attempt to Quit: 06/04/2007  . Smokeless tobacco: Never Used     Comment: see illicit drug comment  . Alcohol Use: Yes     Comment: occasional-once per month    Review of Systems  All other systems reviewed and are negative.     Allergies  Gluten meal; Milk-related compounds; and Latex  Home Medications   Prior to Admission medications   Medication Sig Start Date End Date Taking? Authorizing Provider   risperiDONE (RISPERDAL) 1 MG tablet Take 1 tablet (1 mg total) by mouth at bedtime. 03/20/15  Yes Shuvon B Rankin, NP  traZODone (DESYREL) 50 MG tablet Take 1 tablet (50 mg total) by mouth at bedtime as needed for sleep. 03/20/15  Yes Shuvon B Rankin, NP  esomeprazole (NEXIUM) 40 MG capsule Take 1 capsule (40 mg total) by mouth daily. 04/22/15   Azalia BilisKevin Cristi Gwynn, MD  promethazine (PHENERGAN) 25 MG tablet Take 1 tablet (25 mg total) by mouth every 8 (eight) hours as needed for nausea or vomiting. 04/22/15   Azalia BilisKevin Jacqueline Delapena, MD  sucralfate (CARAFATE) 1 G tablet Take 1 tablet (1 g total) by mouth 4 (four) times daily -  with meals and at bedtime. 04/22/15   Azalia BilisKevin Elexis Pollak, MD   BP 160/90 mmHg  Pulse 53  Temp(Src) 99 F (37.2 C) (Oral)  Resp 18  SpO2 100% Physical Exam  Constitutional: He is oriented to person, place, and time. He appears well-developed and well-nourished.  HENT:  Head: Normocephalic and atraumatic.  Eyes: EOM are normal.  Neck: Normal range of motion.  Cardiovascular: Normal rate, regular rhythm, normal heart sounds and intact distal pulses.   Pulmonary/Chest: Effort normal and breath sounds normal. No respiratory distress.  Abdominal: Soft. He exhibits no distension. There is no tenderness.  Musculoskeletal: Normal  range of motion.  Neurological: He is alert and oriented to person, place, and time.  Skin: Skin is warm and dry.  Psychiatric: He has a normal mood and affect. Judgment normal.  Nursing note and vitals reviewed.   ED Course  Procedures (including critical care time) Labs Review Labs Reviewed  COMPREHENSIVE METABOLIC PANEL - Abnormal; Notable for the following:    Chloride 97 (*)    Glucose, Bld 124 (*)    Total Protein 8.4 (*)    Albumin 5.1 (*)    All other components within normal limits  CBC - Abnormal; Notable for the following:    RBC 4.16 (*)    All other components within normal limits  LIPASE, BLOOD    Imaging Review No results found. I have  personally reviewed and evaluated these images and lab results as part of my medical decision-making.   EKG Interpretation None      MDM   Final diagnoses:  Nausea and vomiting, vomiting of unspecified type  Cannabis use disorder, severe, dependence (HCC)  Dehydration, mild  Abdominal pain, epigastric    Patient was hydrated in the emergency department.  His nausea is somewhat controlled although still mildly nauseated.  He had not seen the patient vomited in the past 30 minutes.  Discharge home in good condition.  He'll follow-up closely with his GI team.  Home with Carafate, Nexium, Phenergan.  He understands to return to the ER for new or worsening symptoms.  This is likely cyclical vomiting syndrome.  Recommended that he curtail his cannabis use    Azalia Bilis, MD 04/22/15 1249

## 2015-04-22 NOTE — ED Notes (Signed)
Patient states he cant afford medication, I asked him why he did not let someone know when he was discharged earlier-has a family member here to take him home-explained to family member he needs to get scripts filled and follow-up with GI/PCP

## 2015-04-25 ENCOUNTER — Encounter (HOSPITAL_COMMUNITY): Payer: Self-pay | Admitting: Emergency Medicine

## 2015-04-25 ENCOUNTER — Emergency Department (HOSPITAL_COMMUNITY): Payer: Commercial Managed Care - PPO

## 2015-04-25 ENCOUNTER — Emergency Department (HOSPITAL_COMMUNITY)
Admission: EM | Admit: 2015-04-25 | Discharge: 2015-04-25 | Disposition: A | Discharge status: Home / Self Care | Payer: Commercial Managed Care - PPO | Attending: Emergency Medicine | Admitting: Emergency Medicine

## 2015-04-25 DIAGNOSIS — Z79899 Other long term (current) drug therapy: Secondary | ICD-10-CM | POA: Insufficient documentation

## 2015-04-25 DIAGNOSIS — K219 Gastro-esophageal reflux disease without esophagitis: Secondary | ICD-10-CM | POA: Insufficient documentation

## 2015-04-25 DIAGNOSIS — Z87442 Personal history of urinary calculi: Secondary | ICD-10-CM | POA: Insufficient documentation

## 2015-04-25 DIAGNOSIS — F319 Bipolar disorder, unspecified: Secondary | ICD-10-CM | POA: Diagnosis not present

## 2015-04-25 DIAGNOSIS — R1110 Vomiting, unspecified: Secondary | ICD-10-CM | POA: Diagnosis present

## 2015-04-25 DIAGNOSIS — G43A0 Cyclical vomiting, not intractable: Secondary | ICD-10-CM | POA: Diagnosis not present

## 2015-04-25 DIAGNOSIS — Z91040 Latex allergy status: Secondary | ICD-10-CM | POA: Insufficient documentation

## 2015-04-25 DIAGNOSIS — F17210 Nicotine dependence, cigarettes, uncomplicated: Secondary | ICD-10-CM | POA: Insufficient documentation

## 2015-04-25 DIAGNOSIS — F209 Schizophrenia, unspecified: Secondary | ICD-10-CM | POA: Diagnosis not present

## 2015-04-25 DIAGNOSIS — R1115 Cyclical vomiting syndrome unrelated to migraine: Secondary | ICD-10-CM

## 2015-04-25 LAB — CBC
HCT: 38.2 % — ABNORMAL LOW (ref 39.0–52.0)
Hemoglobin: 13.5 g/dL (ref 13.0–17.0)
MCH: 33 pg (ref 26.0–34.0)
MCHC: 35.3 g/dL (ref 30.0–36.0)
MCV: 93.4 fL (ref 78.0–100.0)
PLATELETS: 180 10*3/uL (ref 150–400)
RBC: 4.09 MIL/uL — ABNORMAL LOW (ref 4.22–5.81)
RDW: 12 % (ref 11.5–15.5)
WBC: 11.5 10*3/uL — AB (ref 4.0–10.5)

## 2015-04-25 LAB — COMPREHENSIVE METABOLIC PANEL
ALBUMIN: 4.7 g/dL (ref 3.5–5.0)
ALT: 22 U/L (ref 17–63)
AST: 20 U/L (ref 15–41)
Alkaline Phosphatase: 74 U/L (ref 38–126)
Anion gap: 11 (ref 5–15)
BUN: 27 mg/dL — AB (ref 6–20)
CHLORIDE: 94 mmol/L — AB (ref 101–111)
CO2: 29 mmol/L (ref 22–32)
CREATININE: 1.33 mg/dL — AB (ref 0.61–1.24)
Calcium: 10 mg/dL (ref 8.9–10.3)
GFR calc Af Amer: >60 mL/min (ref >60)
GLUCOSE: 138 mg/dL — AB (ref 65–99)
Potassium: 3.1 mmol/L — ABNORMAL LOW (ref 3.5–5.1)
Sodium: 134 mmol/L — ABNORMAL LOW (ref 135–145)
Total Bilirubin: 0.8 mg/dL (ref 0.3–1.2)
Total Protein: 7.9 g/dL (ref 6.5–8.1)

## 2015-04-25 LAB — LIPASE, BLOOD: LIPASE: 22 U/L (ref 11–51)

## 2015-04-25 MED ORDER — KETOROLAC TROMETHAMINE 30 MG/ML IJ SOLN
30.0000 mg | Freq: Once | INTRAMUSCULAR | Status: AC
  Administered 2015-04-25: 30 mg via INTRAMUSCULAR
  Filled 2015-04-25 (×2): qty 1

## 2015-04-25 MED ORDER — HALOPERIDOL LACTATE 5 MG/ML IJ SOLN
2.0000 mg | Freq: Once | INTRAMUSCULAR | Status: AC
  Administered 2015-04-25: 2 mg via INTRAMUSCULAR
  Filled 2015-04-25: qty 1

## 2015-04-25 MED ORDER — DICYCLOMINE HCL 20 MG PO TABS: 20.0000 mg | ORAL_TABLET | Freq: Two times a day (BID) | ORAL | Status: DC

## 2015-04-25 MED ORDER — ONDANSETRON 4 MG PO TBDP: 4.0000 mg | ORAL_TABLET | Freq: Three times a day (TID) | ORAL | Status: DC | PRN

## 2015-04-25 MED ORDER — ONDANSETRON 4 MG PO TBDP
4.0000 mg | ORAL_TABLET | Freq: Once | ORAL | Status: AC | PRN
  Administered 2015-04-25: 4 mg via ORAL
  Filled 2015-04-25: qty 1

## 2015-04-25 MED ORDER — LORAZEPAM 2 MG/ML IJ SOLN
1.0000 mg | Freq: Once | INTRAMUSCULAR | Status: AC
  Administered 2015-04-25: 1 mg via INTRAVENOUS
  Filled 2015-04-25: qty 1

## 2015-04-25 NOTE — ED Notes (Signed)
Patient presents for N/V, BM x1 week, reports being seen recently for same. Mid center abdominal pain and hiccups x3 days. Patient actively vomiting in triage. Hx of GERD & gastritis. Unable to fill phenergan prescription.

## 2015-04-25 NOTE — ED Provider Notes (Signed)
CSN: [id number]     Arrival date & time 04/25/15  0149 History   First MD Initiated Contact with Patient 04/25/15 0335     Chief Complaint  Patient presents with  . Emesis     (Consider location/radiation/quality/duration/timing/severity/associated sxs/prior Treatment) Patient is a 29 y.o. male presenting with vomiting. The history is provided by the patient.  Emesis Severity:  Moderate Timing:  Intermittent Quality:  Bilious material Progression:  Unchanged Chronicity:  Chronic Recent urination:  Normal Relieved by:  Nothing Worsened by:  Nothing tried Ineffective treatments:  None tried Associated symptoms: fever   Associated symptoms: no abdominal pain, no chills, no cough, no sore throat and no URI     Past Medical History  Diagnosis Date  . GERD (gastroesophageal reflux disease)   . Nephrolithiasis   . Cyclic vomiting syndrome   . Bipolar disorder (HCC)   . Bipolar 1 disorder (HCC)   . Schizophrenia, acute [facility](HCC)    Past Surgical History  Procedure Laterality Date  . Unremarkable    . Appendectomy     Family History  Problem Relation Age of Onset  . Colon cancer Maternal Uncle   . Liver cancer Mother   . Diabetes Maternal Aunt   . Heart disease Paternal Grandfather   . Testicular cancer Brother    Social History  Substance Use Topics  . Smoking status: Current Every Day Smoker -- 1 years    Types: Cigarettes    Last Attempt to Quit: 06/04/2007  . Smokeless tobacco: Never Used     Comment: see illicit drug comment  . Alcohol Use: Yes     Comment: occasional-once per month    Review of Systems  Constitutional: Negative for fever and chills.  HENT: Negative for sore throat.   Respiratory: Positive for shortness of breath. Negative for cough.   Cardiovascular: Negative for chest pain.  Gastrointestinal: Positive for nausea and vomiting. Negative for abdominal pain.  Skin: Negative for rash and wound.  All other systems reviewed and are  negative.     Allergies  Gluten meal; Milk-related compounds; and Latex  Home Medications   Prior to Admission medications   Medication Sig Start Date End Date Taking? Authorizing Provider  risperiDONE (RISPERDAL) 1 MG tablet Take 1 tablet (1 mg total) by mouth at bedtime. 03/20/15  Yes Shuvon B Rankin, NP  sucralfate (CARAFATE) 1 G tablet Take 1 tablet (1 g total) by mouth 4 (four) times daily -  with meals and at bedtime. 04/22/15  Yes Azalia BilisKevin Campos, MD  traZODone (DESYREL) 50 MG tablet Take 1 tablet (50 mg total) by mouth at bedtime as needed for sleep. 03/20/15  Yes Shuvon B Rankin, NP  esomeprazole (NEXIUM) 40 MG capsule Take 1 capsule (40 mg total) by mouth daily. Patient not taking: Reported on 04/25/2015 04/22/15   Azalia BilisKevin Campos, MD  promethazine (PHENERGAN) 25 MG tablet Take 1 tablet (25 mg total) by mouth every 8 (eight) hours as needed for nausea or vomiting. Patient not taking: Reported on 04/25/2015 04/22/15   Azalia BilisKevin Campos, MD   BP 135/83 mmHg  Pulse 76  Temp(Src) 98.1 F (36.7 C) (Axillary)  Resp 18  SpO2 100% Physical Exam  Constitutional: He is oriented to person, place, and time. He appears well-developed and well-nourished.  HENT:  Head: Normocephalic.  Eyes: Pupils are equal, round, and reactive to light.  Neck: Normal range of motion.  Cardiovascular: Normal rate and regular rhythm.   Pulmonary/Chest: Effort normal and breath sounds normal.  Abdominal: Soft. Bowel sounds are normal.  Musculoskeletal: Normal range of motion.  Neurological: He is alert and oriented to person, place, and time.  Skin: Skin is warm. No erythema.  Nursing note and vitals reviewed.   ED Course  Procedures (including critical care time) Labs Review Labs Reviewed  CBC - Abnormal; Notable for the following:    WBC 11.5 (*)    RBC 4.09 (*)    HCT 38.2 (*)    All other components within normal limits  LIPASE, BLOOD  COMPREHENSIVE METABOLIC PANEL  URINALYSIS, ROUTINE W REFLEX  MICROSCOPIC (NOT AT [facility])    Imaging Review No results found. I have personally reviewed and evaluated these images and lab results as part of my medical decision-making.   EKG Interpretation None     will give IM Torodol and Haldol for symptoms   History reviewed, normal.  No pneumonia evident.  Patient is more comfortable, although he still states he has little bit of nausea but he is not actively vomiting any longer MDM   Final diagnoses:  None         Earley Favor, NP 04/25/15 0450  April Palumbo, MD 04/25/15 [phone]

## 2015-04-25 NOTE — Discharge Instructions (Signed)
You were seen in the ED today for evaluation of nausea and vomiting. Your labs are normal. 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 I will give you a prescription for Zofran to use as needed for nausea and Bentyl for use as needed for abdominal pain. Please call one of the clinics below to establish primary care and follow-up within one week. I have also given you the phone number for [facility] who you can work with to establish therapy/psychological care as needed to help with marijuana cessation.   Take medications as prescribed. Return to the emergency room for worsening condition or new concerning symptoms. Follow up with your regular doctor. If you don't have a regular doctor use one of the numbers below to establish a primary care doctor.   Emergency Department Resource Guide 1) Find a Doctor and Pay Out of Pocket Although you won't have to find out who is covered by your insurance plan, it is a good idea to ask around and get recommendations. You will then need to call the office and see if the doctor you have chosen will accept you as a new patient and what types of options they offer for patients who are self-pay. Some doctors offer discounts or will set up payment plans for their patients who do not have insurance, but you will need to ask so you aren't surprised when you get to your appointment.  2) Contact Your Local Health Department Not all health departments have doctors that can see patients for sick visits, but many do, so it is worth a call to see if yours does. If you don't know where your local health department is, you can check in your phone book. The CDC also has a tool to help you locate your state's health department, and many state websites also have listings of all of their local health departments.  3) Find a Walk-in Clinic If your illness is not likely to be very severe or complicated, you may want to try a walk in  clinic. These are popping up all over the country in pharmacies, drugstores, and shopping centers. They're usually staffed by nurse practitioners or physician assistants that have been trained to treat common illnesses and complaints. They're usually fairly quick and inexpensive. However, if you have serious medical issues or chronic medical problems, these are probably not your best option.  No Primary Care Doctor: - Call Health Connect at  [phone] - they can help you locate a primary care doctor that  accepts your insurance, provides certain services, etc. - Physician Referral [phone]  Emergency Department Resource Guide 1) Find a Doctor and Pay Out of Pocket Although you won't have to find out who is covered by your insurance plan, it is a good idea to ask around and get recommendations. You will then need to call the office and see if the doctor you have chosen will accept you as a new patient and what types of options they offer for patients who are self-pay. Some doctors offer discounts or will set up payment plans for their patients who do not have insurance, but you will need to ask so you aren't surprised when you get to your appointment.  2) Contact Your Local Health Department Not all health departments have doctors that can see patients for sick visits, but many do, so it is worth a call to see if yours does. If you don't know where your local health  department is, you can check in your phone book. The CDC also has a tool to help you locate your state's health department, and many state websites also have listings of all of their local health departments.  3) Find a Walk-in Clinic If your illness is not likely to be very severe or complicated, you may want to try a walk in clinic. These are popping up all over the country in pharmacies, drugstores, and shopping centers. They're usually staffed by nurse practitioners or physician assistants that have been trained to treat  common illnesses and complaints. They're usually fairly quick and inexpensive. However, if you have serious medical issues or chronic medical problems, these are probably not your best option.  No Primary Care Doctor: - Call Health Connect at  [phone] - they can help you locate a primary care doctor that  accepts your insurance, provides certain services, etc. - Physician Referral Service- [phone]  Chronic Pain Problems: Organization         Address  Phone   Notes  [facility]  [phone] Patients need to be referred by their primary care doctor.   Medication Assistance: Organization         Address  Phone   Notes  [facility] Medication [facility] [address] [place], [place] [address] [phone] --Must be a resident of [facility] -- Must have NO insurance coverage whatsoever (no Medicaid/ Medicare, etc.) -- The pt. MUST have a primary care doctor that directs their care regularly and follows them in the community   Med[facility]ist  [phone]   [employer]  [phone]    Agencies that provide inexpensive medical care: Organization         Address  Phone   Notes  [facility]  [phone]   [facility]    [phone]   [facility] [address] [place], [place] [address] [phone]   [facility] [address], [place] [phone]   [facility]    [phone]   [facility]    [phone]   [facility]  [address], [place] Phone:  [phone], Fax:  [phone] Hours of Operation:  9 am - 6 pm, M-F.  Also accepts Medicaid/Medicare and self-pay.  [facility]  [address], [place] Phone: [phone], Fax: [phone]. Hours of Operation:  8:30 am - 5:30 pm, M-F.  Also accepts Medicaid and self-pay.  [facility] [place] [address], [place] Phone: [phone]   [facility] [address], [place], [place] [phone], Ext. [phone] Mondays & Thursdays: 7-9 AM.  First 15 patients are seen on a first come, first serve basis.    Medicaid-accepting [facility] Providers:  Organization         Address  Phone   Notes  [facility] [address], [place] [phone] Also accepts self-pay patients.  [facility] [address] [place], [place]  [phone]   [facility] [address], [place] [phone]   [facility] [address] [place] [address], [place] [phone]   Renaye RakersVeita Bland [address], [place]   [phone] Only accepts [place] patients after they have  their name applied to their card.   Self-Pay (no insurance) in [facility]:  Organization         Address  Phone   Notes  Sickle Cell Patients, [facility] [address] [place], [place] [phone]   [facility] [address] [place], [place] [phone]   [facility] [place]  [address],  [phone]   [facility]/Dr. Osei-Bonsu  [address] [place] or [address], [place] [phone] Phone number for both [place] and [place] locations is the same.  [facility] [address] [place] [phone]   [facility] [address], [place] or [address] [phone]   [facility] [address] [place] [phone], phone; [phone], fax Sees patients 1st and 3rd Saturday of every month.  Must not qualify for public or private insurance (i.e. Medicaid, Medicare, [place] Health Choice, Veterans' Benefits)  Household income should be no more than 200% of the  poverty level The clinic cannot treat you if you are pregnant or think you are pregnant  Sexually transmitted diseases are not treated at the clinic.

## 2015-04-25 NOTE — ED Provider Notes (Signed)
Pt received at sign out from NP GS. 29 y.o. male with history of regular/chronic MJ use who presents with abd pain, N/V. Likely cyclic vomiting. Haldol, zofran, and bentyl have ben given. Pt still endorses abd pain and nausea with dry heaving. No emesis. Will give 1mg  ativan. If pt tolerating PO will d/c home with Rx for zofran and bentyl. Discussed smoking cessation extensively.   Carlene CoriaSerena Y Sanskriti [place]law, PA-C 04/25/15 [phone]  April Palumbo, MD 05/10/15 [phone]

## 2015-04-25 NOTE — ED Notes (Signed)
Patient is alert and oriented x3.  He was given DC instructions and follow up visit instructions.  Patient gave verbal understanding.  He was DC ambulatory under his own power to home.  V/S stable.  He was not showing any signs of distress on DC 

## 2015-04-25 NOTE — ED Provider Notes (Signed)
CSN: [id number]     Arrival date & time 04/25/15  0149 History   First MD Initiated Contact with Patient 04/25/15 0335     Chief Complaint  Patient presents with  . Emesis     (Consider location/radiation/quality/duration/timing/severity/associated sxs/prior Treatment) HPI Comments: Persistent vomiting   The history is provided by the patient.    Past Medical History  Diagnosis Date  . GERD (gastroesophageal reflux disease)   . Nephrolithiasis   . Cyclic vomiting syndrome   . Bipolar disorder (HCC)   . Bipolar 1 disorder (HCC)   . Schizophrenia, acute [facility])    Past Surgical History  Procedure Laterality Date  . Unremarkable    . Appendectomy     Family History  Problem Relation Age of Onset  . Colon cancer Maternal Uncle   . Liver cancer Mother   . Diabetes Maternal Aunt   . Heart disease Paternal Grandfather   . Testicular cancer Brother    Social History  Substance Use Topics  . Smoking status: Current Every Day Smoker -- 1 years    Types: Cigarettes    Last Attempt to Quit: 06/04/2007  . Smokeless tobacco: Never Used     Comment: see illicit drug comment  . Alcohol Use: Yes     Comment: occasional-once per month    Review of Systems  Constitutional: Negative for fever and chills.  Gastrointestinal: Positive for nausea, vomiting and abdominal pain.  All other systems reviewed and are negative.     Allergies  Gluten meal; Milk-related compounds; and Latex  Home Medications   Prior to Admission medications   Medication Sig Start Date End Date Taking? Authorizing Provider  risperiDONE (RISPERDAL) 1 MG tablet Take 1 tablet (1 mg total) by mouth at bedtime. 03/20/15  Yes Shuvon B Rankin, NP  sucralfate (CARAFATE) 1 G tablet Take 1 tablet (1 g total) by mouth 4 (four) times daily -  with meals and at bedtime. 04/22/15  Yes Azalia Bilis, MD  traZODone (DESYREL) 50 MG tablet Take 1 tablet (50 mg total) by mouth at bedtime as needed for sleep. 03/20/15  Yes  Shuvon B Rankin, NP  dicyclomine (BENTYL) 20 MG tablet Take 1 tablet (20 mg total) by mouth 2 (two) times daily. 04/25/15   Ace Gins Sam, PA-C  esomeprazole (NEXIUM) 40 MG capsule Take 1 capsule (40 mg total) by mouth daily. Patient not taking: Reported on 04/25/2015 04/22/15   Azalia Bilis, MD  ondansetron (ZOFRAN ODT) 4 MG disintegrating tablet Take 1 tablet (4 mg total) by mouth every 8 (eight) hours as needed for nausea or vomiting. 04/25/15   Ace Gins Sam, PA-C  promethazine (PHENERGAN) 25 MG tablet Take 1 tablet (25 mg total) by mouth every 8 (eight) hours as needed for nausea or vomiting. Patient not taking: Reported on 04/25/2015 04/22/15   Azalia Bilis, MD   BP 158/90 mmHg  Pulse 64  Temp(Src) 97.5 F (36.4 C) (Oral)  Resp 16  SpO2 100% Physical Exam  Constitutional: He is oriented to person, place, and time. He appears well-developed and well-nourished.  HENT:  Head: Normocephalic.  Eyes: Pupils are equal, round, and reactive to light.  Neck: Normal range of motion.  Cardiovascular: Normal rate and regular rhythm.   Pulmonary/Chest: Effort normal and breath sounds normal.  Abdominal: Soft. He exhibits no distension. There is no tenderness.  Musculoskeletal: Normal range of motion.  Neurological: He is alert and oriented to person, place, and time.  Skin: Skin is warm.  Nursing  note and vitals reviewed.   ED Course  Procedures (including critical care time) Labs Review Labs Reviewed  COMPREHENSIVE METABOLIC PANEL - Abnormal; Notable for the following:    Sodium 134 (*)    Potassium 3.1 (*)    Chloride 94 (*)    Glucose, Bld 138 (*)    BUN 27 (*)    Creatinine, Ser 1.33 (*)    All other components within normal limits  CBC - Abnormal; Notable for the following:    WBC 11.5 (*)    RBC 4.09 (*)    HCT 38.2 (*)    All other components within normal limits  LIPASE, BLOOD    Imaging Review No results found. I have personally reviewed and evaluated these images  and lab results as part of my medical decision-making.   EKG Interpretation None      MDM   Final diagnoses:  Non-intractable cyclical vomiting with nausea         Earley FavorGail Tamyah Cutbirth, NP 04/28/15 1952  April Palumbo, MD 05/10/15 [phone]

## 2015-04-25 NOTE — ED Notes (Signed)
Pt. Made aware for the need of urine. 

## 2015-06-04 ENCOUNTER — Emergency Department (HOSPITAL_COMMUNITY)
Admission: EM | Admit: 2015-06-04 | Discharge: 2015-06-04 | Disposition: A | Discharge status: Home / Self Care | Payer: Commercial Managed Care - PPO | Attending: Emergency Medicine | Admitting: Emergency Medicine

## 2015-06-04 ENCOUNTER — Encounter (HOSPITAL_COMMUNITY): Payer: Self-pay | Admitting: Emergency Medicine

## 2015-06-04 DIAGNOSIS — Z79899 Other long term (current) drug therapy: Secondary | ICD-10-CM | POA: Insufficient documentation

## 2015-06-04 DIAGNOSIS — K219 Gastro-esophageal reflux disease without esophagitis: Secondary | ICD-10-CM | POA: Insufficient documentation

## 2015-06-04 DIAGNOSIS — F17210 Nicotine dependence, cigarettes, uncomplicated: Secondary | ICD-10-CM | POA: Insufficient documentation

## 2015-06-04 DIAGNOSIS — Z8669 Personal history of other diseases of the nervous system and sense organs: Secondary | ICD-10-CM | POA: Insufficient documentation

## 2015-06-04 DIAGNOSIS — F319 Bipolar disorder, unspecified: Secondary | ICD-10-CM | POA: Insufficient documentation

## 2015-06-04 DIAGNOSIS — R1013 Epigastric pain: Secondary | ICD-10-CM

## 2015-06-04 DIAGNOSIS — Z87442 Personal history of urinary calculi: Secondary | ICD-10-CM | POA: Insufficient documentation

## 2015-06-04 DIAGNOSIS — K2970 Gastritis, unspecified, without bleeding: Secondary | ICD-10-CM

## 2015-06-04 DIAGNOSIS — F209 Schizophrenia, unspecified: Secondary | ICD-10-CM | POA: Insufficient documentation

## 2015-06-04 DIAGNOSIS — Z91040 Latex allergy status: Secondary | ICD-10-CM | POA: Insufficient documentation

## 2015-06-04 LAB — COMPREHENSIVE METABOLIC PANEL
ALT: 22 U/L (ref 17–63)
AST: 28 U/L (ref 15–41)
Albumin: 5.2 g/dL — ABNORMAL HIGH (ref 3.5–5.0)
Alkaline Phosphatase: 79 U/L (ref 38–126)
Anion gap: 15 (ref 5–15)
BUN: 30 mg/dL — ABNORMAL HIGH (ref 6–20)
CO2: 28 mmol/L (ref 22–32)
Calcium: 10.3 mg/dL (ref 8.9–10.3)
Chloride: 91 mmol/L — ABNORMAL LOW (ref 101–111)
Creatinine, Ser: 1.06 mg/dL (ref 0.61–1.24)
GFR calc Af Amer: >60 mL/min (ref >60)
GFR calc non Af Amer: >60 mL/min (ref >60)
Glucose, Bld: 145 mg/dL — ABNORMAL HIGH (ref 65–99)
Potassium: 3.8 mmol/L (ref 3.5–5.1)
Sodium: 134 mmol/L — ABNORMAL LOW (ref 135–145)
Total Bilirubin: 1.1 mg/dL (ref 0.3–1.2)
Total Protein: 8.7 g/dL — ABNORMAL HIGH (ref 6.5–8.1)

## 2015-06-04 LAB — CBC
HEMATOCRIT: 39.6 % (ref 39.0–52.0)
HEMOGLOBIN: 14.3 g/dL (ref 13.0–17.0)
MCH: 33.3 pg (ref 26.0–34.0)
MCHC: 36.1 g/dL — AB (ref 30.0–36.0)
MCV: 92.1 fL (ref 78.0–100.0)
Platelets: 199 10*3/uL (ref 150–400)
RBC: 4.3 MIL/uL (ref 4.22–5.81)
RDW: 12 % (ref 11.5–15.5)
WBC: 8.1 10*3/uL (ref 4.0–10.5)

## 2015-06-04 LAB — LIPASE, BLOOD: Lipase: 12 U/L (ref 11–51)

## 2015-06-04 MED ORDER — PANTOPRAZOLE SODIUM 40 MG IV SOLR
40.0000 mg | Freq: Once | INTRAVENOUS | Status: AC
  Administered 2015-06-04: 40 mg via INTRAVENOUS
  Filled 2015-06-04: qty 40

## 2015-06-04 MED ORDER — SODIUM CHLORIDE 0.9 % IV SOLN
1000.0000 mL | Freq: Once | INTRAVENOUS | Status: AC
  Administered 2015-06-04: 1000 mL via INTRAVENOUS

## 2015-06-04 MED ORDER — SUCRALFATE 1 GM/10ML PO SUSP
1.0000 g | Freq: Three times a day (TID) | ORAL | Status: DC
  Administered 2015-06-04: 1 g via ORAL
  Filled 2015-06-04 (×3): qty 10

## 2015-06-04 MED ORDER — SUCRALFATE 1 GM/10ML PO SUSP: 1.0000 g | Freq: Three times a day (TID) | ORAL | Status: DC

## 2015-06-04 MED ORDER — OMEPRAZOLE 20 MG PO CPDR: 20.0000 mg | DELAYED_RELEASE_CAPSULE | Freq: Every day | ORAL | Status: DC

## 2015-06-04 MED ORDER — SODIUM CHLORIDE 0.9 % IV SOLN
1000.0000 mL | INTRAVENOUS | Status: DC
  Administered 2015-06-04: 1000 mL via INTRAVENOUS

## 2015-06-04 MED ORDER — ONDANSETRON 4 MG PO TBDP: 4.0000 mg | ORAL_TABLET | ORAL | Status: DC | PRN

## 2015-06-04 MED ORDER — DIPHENHYDRAMINE HCL 50 MG/ML IJ SOLN
25.0000 mg | Freq: Once | INTRAMUSCULAR | Status: AC
  Administered 2015-06-04: 25 mg via INTRAVENOUS
  Filled 2015-06-04: qty 1

## 2015-06-04 MED ORDER — METOCLOPRAMIDE HCL 5 MG/ML IJ SOLN
10.0000 mg | Freq: Once | INTRAMUSCULAR | Status: AC
  Administered 2015-06-04: 10 mg via INTRAVENOUS
  Filled 2015-06-04: qty 2

## 2015-06-04 NOTE — Discharge Instructions (Signed)
Abdominal Pain, Adult °Many things can cause abdominal pain. Usually, abdominal pain is not caused by a disease and will improve without treatment. It can often be observed and treated at home. Your health care provider will do a physical exam and possibly order blood tests and X-rays to help determine the seriousness of your pain. However, in many cases, more time must pass before a clear cause of the pain can be found. Before that point, your health care provider may not know if you need more testing or further treatment. °HOME CARE INSTRUCTIONS °Monitor your abdominal pain for any changes. The following actions may help to alleviate any discomfort you are experiencing: °· Only take over-the-counter or prescription medicines as directed by your health care provider. °· Do not take laxatives unless directed to do so by your health care provider. °· Try a clear liquid diet (broth, tea, or water) as directed by your health care provider. Slowly move to a bland diet as tolerated. °SEEK MEDICAL CARE IF: °· You have unexplained abdominal pain. °· You have abdominal pain associated with nausea or diarrhea. °· You have pain when you urinate or have a bowel movement. °· You experience abdominal pain that wakes you in the night. °· You have abdominal pain that is worsened or improved by eating food. °· You have abdominal pain that is worsened with eating fatty foods. °· You have a fever. °SEEK IMMEDIATE MEDICAL CARE IF: °· Your pain does not go away within 2 hours. °· You keep throwing up (vomiting). °· Your pain is felt only in portions of the abdomen, such as the right side or the left lower portion of the abdomen. °· You pass bloody or black tarry stools. °MAKE SURE YOU: °· Understand these instructions. °· Will watch your condition. °· Will get help right away if you are not doing well or get worse. °  °This information is not intended to replace advice given to you by your health care provider. Make sure you discuss  any questions you have with your health care provider. °  °Document Released: 02/24/2005 Document Revised: 02/05/2015 Document Reviewed: 01/24/2013 °Elsevier Interactive Patient Education ©2016 Elsevier Inc. °Gastritis, Adult °Gastritis is soreness and swelling (inflammation) of the lining of the stomach. Gastritis can develop as a sudden onset (acute) or long-term (chronic) condition. If gastritis is not treated, it can lead to stomach bleeding and ulcers. °CAUSES  °Gastritis occurs when the stomach lining is weak or damaged. Digestive juices from the stomach then inflame the weakened stomach lining. The stomach lining may be weak or damaged due to viral or bacterial infections. One common bacterial infection is the Helicobacter pylori infection. Gastritis can also result from excessive alcohol consumption, taking certain medicines, or having too much acid in the stomach.  °SYMPTOMS  °In some cases, there are no symptoms. When symptoms are present, they may include: °· Pain or a burning sensation in the upper abdomen. °· Nausea. °· Vomiting. °· An uncomfortable feeling of fullness after eating. °DIAGNOSIS  °Your caregiver may suspect you have gastritis based on your symptoms and a physical exam. To determine the cause of your gastritis, your caregiver may perform the following: °· Blood or stool tests to check for the H pylori bacterium. °· Gastroscopy. A thin, flexible tube (endoscope) is passed down the esophagus and into the stomach. The endoscope has a light and camera on the end. Your caregiver uses the endoscope to view the inside of the stomach. °· Taking a tissue sample (biopsy)   from the stomach to examine under a microscope. °TREATMENT  °Depending on the cause of your gastritis, medicines may be prescribed. If you have a bacterial infection, such as an H pylori infection, antibiotics may be given. If your gastritis is caused by too much acid in the stomach, H2 blockers or antacids may be given. Your  caregiver may recommend that you stop taking aspirin, ibuprofen, or other nonsteroidal anti-inflammatory drugs (NSAIDs). °HOME CARE INSTRUCTIONS °· Only take over-the-counter or prescription medicines as directed by your caregiver. °· If you were given antibiotic medicines, take them as directed. Finish them even if you start to feel better. °· Drink enough fluids to keep your urine clear or pale yellow. °· Avoid foods and drinks that make your symptoms worse, such as: °¨ Caffeine or alcoholic drinks. °¨ Chocolate. °¨ Peppermint or mint flavorings. °¨ Garlic and onions. °¨ Spicy foods. °¨ Citrus fruits, such as oranges, lemons, or limes. °¨ Tomato-based foods such as sauce, chili, salsa, and pizza. °¨ Fried and fatty foods. °· Eat small, frequent meals instead of large meals. °SEEK IMMEDIATE MEDICAL CARE IF:  °· You have black or dark red stools. °· You vomit blood or material that looks like coffee grounds. °· You are unable to keep fluids down. °· Your abdominal pain gets worse. °· You have a fever. °· You do not feel better after 1 week. °· You have any other questions or concerns. °MAKE SURE YOU: °· Understand these instructions. °· Will watch your condition. °· Will get help right away if you are not doing well or get worse. °  °This information is not intended to replace advice given to you by your health care provider. Make sure you discuss any questions you have with your health care provider. °  °Document Released: 05/11/2001 Document Revised: 11/16/2011 Document Reviewed: 06/30/2011 °Elsevier Interactive Patient Education ©2016 Elsevier Inc. ° °Emergency Department Resource Guide °1) Find a Doctor and Pay Out of Pocket °Although you won't have to find out who is covered by your insurance plan, it is a good idea to ask around and get recommendations. You will then need to call the office and see if the doctor you have chosen will accept you as a new patient and what types of options they offer for patients  who are self-pay. Some doctors offer discounts or will set up payment plans for their patients who do not have insurance, but you will need to ask so you aren't surprised when you get to your appointment. ° °2) Contact Your Local Health Department °Not all health departments have doctors that can see patients for sick visits, but many do, so it is worth a call to see if yours does. If you don't know where your local health department is, you can check in your phone book. The CDC also has a tool to help you locate your state's health department, and many state websites also have listings of all of their local health departments. ° °3) Find a Walk-in Clinic °If your illness is not likely to be very severe or complicated, you may want to try a walk in clinic. These are popping up all over the country in pharmacies, drugstores, and shopping centers. They're usually staffed by nurse practitioners or physician assistants that have been trained to treat common illnesses and complaints. They're usually fairly quick and inexpensive. However, if you have serious medical issues or chronic medical problems, these are probably not your best option. ° °No Primary Care Doctor: °- Call   [facility] at  [phone] - they can help you locate a primary care doctor that  accepts your insurance, provides certain services, etc. °- Physician Referral Service- 1-800-533-3463 ° °Chronic Pain Problems: °Organization         Address  Phone   Notes  °[facility]  (336) 297-2271 Patients need to be referred by their primary care doctor.  ° °Medication Assistance: °Organization         Address  Phone   Notes  °[place] [facility] [address] °[place], [place] [address] (336) 641-8030 --Must be a resident of [place] °-- Must have NO insurance coverage whatsoever (no Medicaid/ Medicare, etc.) °-- The pt. MUST have a primary care doctor that directs their care regularly and follows  them in the community °  °[facility]  (866) 331-1348   °[employer]  (888) 892-1162   ° °Agencies that provide inexpensive medical care: °Organization         Address  Phone   Notes  °[facility]  (336) 832-8035   °[facility]    (336) 832-7272   °[facility] [address] °[place], [place] [address] (336) 832-4777   °[facility] [address], °[place] (336) 271-4999   °[facility]    (336) 373-0678   °[facility]    (336) 272-1050   °[facility] ° [address], [place] Phone:  (336) 832-4444, Fax:  (336) 832-4440 Hours of Operation:  9 am - 6 pm, M-F.  Also accepts Medicaid/Medicare and self-pay.  °[facility] ° [address], [place] Phone: (336) 832-3150, Fax: (336) [phone]. Hours of Operation:  8:30 am - 5:30 pm, M-F.  Also accepts Medicaid and self-pay.  °[facility] [place] [address], [place] Phone: (336) 878-6027   °[facility] [address], [place], [place] (336)723-1848, Ext. 123 Mondays & Thursdays: 7-9 AM.  First 15 patients are seen on a first come, first serve basis. °  ° °Medicaid-accepting [place] Providers: ° °Organization         Address  Phone   Notes  °[facility] [address], [place] (336) 641-2100 Also accepts self-pay patients.  °[facility] [address], [place] ° (336) 856-9996   °[facility] [address], [place] (336) 288-8857   °[facility] [address] [place] [address], [place] (336) 299-7000   °Veita Bland [address], [place]  ° (336) 373-1557 Only accepts [place] Access Medicaid patients after they have their name applied to their card.  ° °Self-Pay (no insurance) in [place]: ° °Organization         Address  Phone   Notes  °Sickle Cell  Patients, [facility] [address], [place] (336) 832-1970   °[facility] [address], [place] (336) 832-4400   °[facility] [place] ° [address], [place] (336) 992-4800   °[facility]/Dr. Osei-Bonsu ° [address] [place] [address], [place] or [address], [place] (336) [phone] Phone number for both [place] and [place] locations is the same.  °[facility] [address],  (336) 299-0000   °[facility]  [address]   [place] [address], [place] or [address] (336) 852-7530 °(336) 878-2260   °[facility] [address], [place] (336) 350-1642, phone; (336) 294-5005, fax Sees patients 1st and 3rd Saturday of every month.  Must not qualify for public or private insurance (i.e. Medicaid, Medicare, [place] Health Choice, Veterans' Benefits) • Household income should be no more than 200% of the poverty level •The clinic cannot treat you if you are pregnant or think you are pregnant • Sexually transmitted diseases are not treated at the clinic.  ° ° °Dental Care: °Organization         Address  Phone  Notes  °[facility] [address], [place] (336) 641-6152 Accepts children up to age 21 who are enrolled in Medicaid or [place] Health Choice; pregnant women with a Medicaid card; and children who have applied for Medicaid or [place] Health Choice, but were declined, whose parents can pay a reduced fee at time of service.  °[facility] [place]  [address], [place] (336) 641-7733 Accepts children up to age 21 who are enrolled in Medicaid or [place] Health Choice; pregnant women with a Medicaid card; and children who have applied for Medicaid or [place] Health Choice, but were declined, whose parents can pay a reduced fee at time of service.  °[facility] ° [address], [place] (336) 641-4533 Patients are seen by appointment only. Walk-ins are not accepted. [facility] will see patients 18 years of age and older. °Monday - Tuesday (8am-5pm) °Most Wednesdays (8:30-5pm) °$30 per visit, cash only  °[facility] ° [address], [place] (336) 641-4533 Patients are seen by appointment only. Walk-ins are not accepted. [facility] will see patients 18 years of age and older. °One Wednesday Evening (Monthly: Volunteer Based).  $30 per visit, cash only  °[facility]  (919) 537-3737 for adults; Children under age 4, call Graduate Pediatric Dentistry at ([phone]) [phone]. Children aged 4-14, please call (919) [phone] to request a pediatric application. ° Dental services are provided in all areas of dental care including fillings, crowns and bridges, complete and partial dentures, implants, gum treatment, root canals, and extractions. Preventive care is also provided. Treatment is provided to both adults and children. °Patients are selected via a lottery and there is often a waiting list. °  °[facility] [address], °[place] ° (336) 763-8833 [email or web address] °  °[facility] [address], [place], [place] (336)723-1848, Ext. 123 Second and Fourth Thursday of each month, opens at 6:30 AM; Clinic ends at 9 AM.  Patients are seen on a first-come first-served basis, and a limited number are seen during each clinic.  ° °[facility] ° [address], [place], [place] (336) 723-7904   Eligibility Requirements °You must have lived in [place], [place], or [place] counties for at least the last three months. °  You cannot be eligible for state or federal sponsored healthcare insurance, including Veterans Administration, Medicaid, or Medicare. °  You generally cannot be eligible for healthcare insurance through your employer.  °  How to apply: °Eligibility  screenings are held every Tuesday and Wednesday afternoon from 1:00 pm until 4:00 pm. You do not need an appointment for the interview!  °[facility] [address], [place], [place] 336-631-2330   °[facility]    336-342-8273   °[facility]  336-703-3100   °[facility]  336-570-6415   ° °Behavioral Health Resources in the Community: °Intensive Outpatient Programs °Organization         Address  Phone  Notes  °[facility] [address], [place], [place] 336-878-6098   °[facility] [address], [place], [place] 336-832-9800   °[facility] [address], [place], [place] ° 336-882-2125   °[facility] [address],  °[place], [place] 1-800-853-5163 or 336-641-4981   °Substance Abuse Resources °Organization         Address  Phone  Notes  °[facility]  336-882-2125   °[facility]  336-784-9470   °[facility]  336-285-9073   °[facility]  336-845-3988   °Residential & Outpatient Substance Abuse Program  1-800-659-3381   °Psychological Services °Organization         Address  Phone  Notes  °[facility]  336- 832-9600   °[facility]  336- 378-7881   °[facility] [address], [place] 1-800-853-5163 or 336-641-4981   ° °Mobile Crisis Teams °Organization         Address  Phone  Notes  °[facility], Mobile Crisis Care Unit  1-877-626-1772   °Assertive °[facility] ° [address] [place], [place] 336-834-9664   °Sharon DeEsch [address] °[place] 336-554-5454   ° °Self-Help/Support Groups °Organization         Address  Phone             Notes  °[facility] - variety of support groups  336- 373-1402 Call for more information  °Narcotics Anonymous (NA), [facility] [address], °[place]  2 meetings at  this location  ° °Residential Treatment Programs °Organization         Address  Phone  Notes  °[facility] [address],    °[place]  1-866-801-8205   °[facility] ° [address], [place], [place] 704-293-8524   °[facility] [address], [place] 336-845-3988 Admissions: 8am-3pm M-F  °[facility] [address]    °[place], [place] 336-841-1104   °[facility] [address], [place], [place] 336-379-7146   °[facility] [address]  °[place], [place] 336-285-9073   °[facility] - Intensive Outpatient [address], [place], [place] 336-852-3033   °[facility] [address]  °[place], [place] 1-877-615-2722 or 336-784-9470   °[facility] [address] [place], [place] 336-227-7417 Accepts Medicaid  °[facility] [address]  °[place] 1-800-659-3381 Substance Abuse/Addiction Treatment  ° °[place] Behavioral Health Resources °Organization         Address  Phone  Notes  °[facility]  (888) 581-9988   °Julie Brannon, PhD [address] [place], [place]   (336) 349-5553 or (336) 951-0000   °[facility]   [address] °, [place] (336) 349-4454   °[facility] [address], [place], [place] (336) 342-8316 Insurance/Medicaid/sponsorship through [facility]  °[facility] [address]                                      [place], [place] (336) 342-8316 Therapy/tele-psych/case  °[facility] [address]  ° [place], [place] (336) 349-2233    °Dr. Arfeen  (336) 349-4544   °[facility] [place]  [employer] [place] [facility] 1) [address], [place] °2) [address], [place] °3)  [address], [place] (336) 349-3220 °(336) 342-7768 ° °(336) 342-8140   °[place] [facility] (336) 342-1394 or (336)  342-3537 (After Hours)    ° ° °

## 2015-06-04 NOTE — ED Provider Notes (Signed)
CSN: [id number]     Arrival date & time 06/04/15  1152 History   First MD Initiated Contact with Patient 06/04/15 1621     Chief Complaint  Patient presents with  . Nausea  . Emesis     (Consider location/radiation/quality/duration/timing/severity/associated sxs/prior Treatment) HPI Patient reports vomiting and epigastric pain for approximately 3 days. He reports he ate some Ziti 3 days ago that he believes triggered this event. He has had cyclic vomiting in the past that was thought to be due to marijuana smoking, patient reports he however has not had any marijuana in quite some time and that that is not likely the cause. He has not had diarrhea or fever. The patient reports that he has not taken any PPIs or other GI medications since he ran out. When he was taking them, he reports symptoms were improved. The patient reports he does have a GI follow-up scheduled for the 15th of this month. This will be a first follow-up visit for him from prior visits for epigastric pain and vomiting. Past Medical History  Diagnosis Date  . GERD (gastroesophageal reflux disease)   . Nephrolithiasis   . Cyclic vomiting syndrome   . Bipolar disorder (HCC)   . Bipolar 1 disorder (HCC)   . Schizophrenia, acute [facility](HCC)    Past Surgical History  Procedure Laterality Date  . Unremarkable    . Appendectomy     Family History  Problem Relation Age of Onset  . Colon cancer Maternal Uncle   . Liver cancer Mother   . Diabetes Maternal Aunt   . Heart disease Paternal Grandfather   . Testicular cancer Brother    Social History  Substance Use Topics  . Smoking status: Current Every Day Smoker -- 1 years    Types: Cigarettes    Last Attempt to Quit: 06/04/2007  . Smokeless tobacco: Never Used     Comment: see illicit drug comment  . Alcohol Use: Yes     Comment: occasional-once per month    Review of Systems  10 Systems reviewed and are negative for acute change except as noted in the  HPI.   Allergies  Gluten meal; Milk-related compounds; and Latex  Home Medications   Prior to Admission medications   Medication Sig Start Date End Date Taking? Authorizing Provider  dicyclomine (BENTYL) 20 MG tablet Take 1 tablet (20 mg total) by mouth 2 (two) times daily. 04/25/15  Yes Ace GinsSerena Y Sam, PA-C  esomeprazole (NEXIUM) 40 MG capsule Take 1 capsule (40 mg total) by mouth daily. 04/22/15  Yes Azalia BilisKevin Campos, MD  ondansetron (ZOFRAN ODT) 4 MG disintegrating tablet Take 1 tablet (4 mg total) by mouth every 8 (eight) hours as needed for nausea or vomiting. 04/25/15  Yes Ace GinsSerena Y Sam, PA-C  risperiDONE (RISPERDAL) 1 MG tablet Take 1 tablet (1 mg total) by mouth at bedtime. 03/20/15  Yes Shuvon B Rankin, NP  traZODone (DESYREL) 50 MG tablet Take 1 tablet (50 mg total) by mouth at bedtime as needed for sleep. 03/20/15  Yes Shuvon B Rankin, NP  omeprazole (PRILOSEC) 20 MG capsule Take 1 capsule (20 mg total) by mouth daily. 06/04/15   Arby BarretteMarcy Hicks Feick, MD  ondansetron (ZOFRAN ODT) 4 MG disintegrating tablet Take 1 tablet (4 mg total) by mouth every 4 (four) hours as needed for nausea or vomiting. 06/04/15   Arby BarretteMarcy Keighley Deckman, MD  promethazine (PHENERGAN) 25 MG tablet Take 1 tablet (25 mg total) by mouth every 8 (eight) hours as needed for nausea  or vomiting. Patient not taking: Reported on 04/25/2015 04/22/15   Azalia Bilis, MD  sucralfate (CARAFATE) 1 G tablet Take 1 tablet (1 g total) by mouth 4 (four) times daily -  with meals and at bedtime. Patient not taking: Reported on 06/04/2015 04/22/15   Azalia Bilis, MD  sucralfate (CARAFATE) 1 GM/10ML suspension Take 10 mLs (1 g total) by mouth 4 (four) times daily -  with meals and at bedtime. 06/04/15   Arby Barrette, MD   BP 146/91 mmHg  Pulse 58  Temp(Src) 99.3 F (37.4 C) (Oral)  Resp 18  SpO2 100% Physical Exam  Constitutional: He is oriented to person, place, and time. He appears well-developed and well-nourished.  Patient appears uncomfortable  but is alert and nontoxic. Appearance is otherwise well.  HENT:  Head: Normocephalic and atraumatic.  Eyes: EOM are normal. Pupils are equal, round, and reactive to light.  Neck: Neck supple.  Cardiovascular: Normal rate, regular rhythm, normal heart sounds and intact distal pulses.   Pulmonary/Chest: Effort normal and breath sounds normal.  Abdominal: Soft. Bowel sounds are normal. He exhibits no distension. There is tenderness.  Moderate epigastric tenderness without guarding.  Musculoskeletal: Normal range of motion. He exhibits no edema.  Neurological: He is alert and oriented to person, place, and time. He has normal strength. Coordination normal. GCS eye subscore is 4. GCS verbal subscore is 5. GCS motor subscore is 6.  Skin: Skin is warm, dry and intact.  Psychiatric: He has a normal mood and affect.    ED Course  Procedures (including critical care time) Labs Review Labs Reviewed  COMPREHENSIVE METABOLIC PANEL - Abnormal; Notable for the following:    Sodium 134 (*)    Chloride 91 (*)    Glucose, Bld 145 (*)    BUN 30 (*)    Total Protein 8.7 (*)    Albumin 5.2 (*)    All other components within normal limits  CBC - Abnormal; Notable for the following:    MCHC 36.1 (*)    All other components within normal limits  LIPASE, BLOOD  URINALYSIS, ROUTINE W REFLEX MICROSCOPIC (NOT AT [facility])    Imaging Review No results found. I have personally reviewed and evaluated these images and lab results as part of my medical decision-making.   EKG Interpretation None      MDM   Final diagnoses:  Epigastric pain  Gastritis   Patient has had episodes of recurrent epigastric pain and vomiting. At this time he attributes it to having eaten Pasta with red sauce 3 days ago. Last do not show any indication of pancreatitis or cholecystitis. His abdominal examination is nonsurgical. At this time I do feel patient is safe for ongoing outpatient management. He has not recently been  taking GI medications. He is counseled to resume PPI and carafatet as prescribed.   Arby Barrette, MD 06/06/15 [phone]

## 2015-06-04 NOTE — ED Notes (Signed)
Pt stated he is unable to give a urine sample at this time. 

## 2015-06-04 NOTE — ED Notes (Signed)
[place] [facility]  Pt c/o NV, gen abd pain x 3 days.

## 2015-06-04 NOTE — Progress Notes (Signed)
Patient noted to have been seen in the ED 9 times within the last six months.  EDCM spoke to patient at bedside. Patient confirms he [place] a pcp [place]  [facility], informed patient of services there.  EDCM also provided patient with list of pcps who accept self pay patients, list of discount pharmacies and websites needymeds.org and GoodRX.com for medication assistance, phone number to inquire about the orange card, phone number to inquire about Medicaid, phone number to inquire about the Affordable Care Act, financial resources in the community such as local churches, [employer], urban ministries, and dental assistance for uninsured patients.  Patient thankful for resources.  No further EDCM needs at this time.  Patient reports he has [employer] insurance.

## 2015-06-04 NOTE — ED Notes (Addendum)
Pt is aware we need a urine sample. Pt was asked if he was able to go after completing the Orthostatic VS and he said no. Asked pt if he could notify when he was able to void.

## 2015-06-06 ENCOUNTER — Telehealth: Payer: Self-pay

## 2015-06-06 NOTE — Telephone Encounter (Signed)
Nurse called patient, patient verified date of birth. Patient agrees to be scheduled to see Dr. Mauricio PoBreen Wednesday, January 11, at 4pm. Nurse scheduled appointment.

## 2015-06-06 NOTE — Telephone Encounter (Signed)
Patient reports having stomach issues that cause him to go back and forth to the hospital.  Patient was in hospital on Jun 04, 2015.  Patient reports not having insurance and needs to make appointment.  Nurse will speak with office manager regarding appointment for patient.

## 2015-06-11 ENCOUNTER — Ambulatory Visit: Payer: Self-pay | Admitting: Family Medicine

## 2015-06-25 ENCOUNTER — Ambulatory Visit: Payer: Self-pay | Admitting: Internal Medicine

## 2015-12-29 ENCOUNTER — Emergency Department (HOSPITAL_COMMUNITY)
Admission: EM | Admit: 2015-12-29 | Discharge: 2015-12-30 | Disposition: A | Discharge status: Home / Self Care | Payer: Commercial Managed Care - PPO | Attending: Emergency Medicine | Admitting: Emergency Medicine

## 2015-12-29 DIAGNOSIS — R066 Hiccough: Secondary | ICD-10-CM | POA: Diagnosis not present

## 2015-12-29 DIAGNOSIS — R0602 Shortness of breath: Secondary | ICD-10-CM | POA: Diagnosis not present

## 2015-12-29 DIAGNOSIS — F1290 Cannabis use, unspecified, uncomplicated: Secondary | ICD-10-CM | POA: Insufficient documentation

## 2015-12-29 DIAGNOSIS — F17210 Nicotine dependence, cigarettes, uncomplicated: Secondary | ICD-10-CM | POA: Insufficient documentation

## 2015-12-29 DIAGNOSIS — R109 Unspecified abdominal pain: Secondary | ICD-10-CM | POA: Insufficient documentation

## 2015-12-29 DIAGNOSIS — G479 Sleep disorder, unspecified: Secondary | ICD-10-CM | POA: Insufficient documentation

## 2015-12-29 DIAGNOSIS — R450 Nervousness: Secondary | ICD-10-CM | POA: Insufficient documentation

## 2015-12-29 NOTE — ED Triage Notes (Signed)
Pt called twice for reassess vitals no response.

## 2015-12-29 NOTE — ED Triage Notes (Signed)
Pt states that he was sick last week and has since been hiccupping non stop. Unable to sleep because of this. Alert and oriented.

## 2015-12-30 ENCOUNTER — Emergency Department (HOSPITAL_COMMUNITY): Payer: Commercial Managed Care - PPO

## 2015-12-30 MED ORDER — CHLORPROMAZINE HCL 50 MG PO TABS
50.0000 mg | ORAL_TABLET | Freq: Three times a day (TID) | ORAL | 0 refills | Status: DC | PRN
Start: 2015-12-30 — End: 2021-01-21

## 2015-12-30 MED ORDER — CHLORPROMAZINE HCL 25 MG PO TABS
50.0000 mg | ORAL_TABLET | Freq: Once | ORAL | Status: AC
  Administered 2015-12-30: 50 mg via ORAL
  Filled 2015-12-30: qty 2

## 2015-12-30 NOTE — ED Notes (Signed)
Patient transported to X-ray 

## 2015-12-30 NOTE — ED Provider Notes (Signed)
[facility]-EMERGENCY DEPT Provider Note   CSN: [id number] Arrival date & time: 12/29/15  1933  First Provider Contact:  None   By signing my name below, I, Erik Cowan, attest that this documentation has been prepared under the direction and in the presence of non-physician practitioner, Melvenia Beam A Parks Czajkowski PA-C. Electronically Signed: Levon Cowan, Scribe. 12/30/2015. 12:16 AM.  History   Chief Complaint Chief Complaint  Patient presents with  . Hiccups    HPI Erik Cowan is a 30 y.o. male who presents to the Emergency Department complaining of constant hiccups onset 9 days ago. He reports hiccups start when he wakes in the morning and are present all day. He has some upper abdominal pain that started after several days of hiccups. He describes SOB that is directly associated with active symptoms when the hiccup is "hard" and he catches his breath. Pt is a smoker. He denies vomiting, cough, and fever.   The history is provided by the patient. No language interpreter was used.    Past Medical History:  Diagnosis Date  . Bipolar 1 disorder (HCC)   . Bipolar disorder (HCC)   . Cyclic vomiting syndrome   . GERD (gastroesophageal reflux disease)   . Nephrolithiasis   . Schizophrenia, acute [facility])     Patient Active Problem List   Diagnosis Date Noted  . Cannabis use disorder, severe, dependence (HCC) 03/19/2015  . Bipolar 1 disorder (HCC) 03/19/2015  . Heart murmur 05/23/2012  . Dehydration, mild 05/23/2012  . Hyponatremia 06/04/2011  . Hypokalemia 06/04/2011  . Leukocytosis 06/04/2011  . GERD (gastroesophageal reflux disease)   . Nephrolithiasis   . Cyclic vomiting syndrome   . Abdominal pain, epigastric 06/10/2010    Past Surgical History:  Procedure Laterality Date  . APPENDECTOMY    . unremarkable        Home Medications    Prior to Admission medications   Medication Sig Start Date End Date Taking? Authorizing Provider  dicyclomine (BENTYL) 20 MG tablet Take 1  tablet (20 mg total) by mouth 2 (two) times daily. Patient not taking: Reported on 12/29/2015 04/25/15   Ace Gins Sam, PA-C  esomeprazole (NEXIUM) 40 MG capsule Take 1 capsule (40 mg total) by mouth daily. Patient not taking: Reported on 12/29/2015 04/22/15   Azalia Bilis, MD  omeprazole (PRILOSEC) 20 MG capsule Take 1 capsule (20 mg total) by mouth daily. Patient not taking: Reported on 12/29/2015 06/04/15   Arby Barrette, MD  ondansetron (ZOFRAN ODT) 4 MG disintegrating tablet Take 1 tablet (4 mg total) by mouth every 8 (eight) hours as needed for nausea or vomiting. Patient not taking: Reported on 12/29/2015 04/25/15   Ace Gins Sam, PA-C  ondansetron (ZOFRAN ODT) 4 MG disintegrating tablet Take 1 tablet (4 mg total) by mouth every 4 (four) hours as needed for nausea or vomiting. Patient not taking: Reported on 12/29/2015 06/04/15   Arby Barrette, MD  promethazine (PHENERGAN) 25 MG tablet Take 1 tablet (25 mg total) by mouth every 8 (eight) hours as needed for nausea or vomiting. Patient not taking: Reported on 04/25/2015 04/22/15   Azalia Bilis, MD  risperiDONE (RISPERDAL) 1 MG tablet Take 1 tablet (1 mg total) by mouth at bedtime. Patient not taking: Reported on 12/29/2015 03/20/15   Shuvon B Rankin, NP  sucralfate (CARAFATE) 1 G tablet Take 1 tablet (1 g total) by mouth 4 (four) times daily -  with meals and at bedtime. Patient not taking: Reported on 06/04/2015 04/22/15   Azalia Bilis, MD  sucralfate (CARAFATE) 1 GM/10ML suspension Take 10 mLs (1 g total) by mouth 4 (four) times daily -  with meals and at bedtime. Patient not taking: Reported on 12/29/2015 06/04/15   Arby Barrette, MD  traZODone (DESYREL) 50 MG tablet Take 1 tablet (50 mg total) by mouth at bedtime as needed for sleep. Patient not taking: Reported on 12/29/2015 03/20/15   Talmage Nap, NP    Family History Family History  Problem Relation Age of Onset  . Colon cancer Maternal Uncle   . Liver cancer Mother   . Diabetes Maternal  Aunt   . Heart disease Paternal Grandfather   . Testicular cancer Brother     Social History Social History  Substance Use Topics  . Smoking status: Current Every Day Smoker    Years: 1.00    Types: Cigarettes    Last attempt to quit: 06/04/2007  . Smokeless tobacco: Never Used     Comment: see illicit drug comment  . Alcohol use Yes     Comment: occasional-once per month     Allergies   Gluten meal; Milk-related compounds; and Latex   Review of Systems Review of Systems  Constitutional: Negative for fever.  Respiratory: Positive for shortness of breath. Negative for cough.   Gastrointestinal: Positive for abdominal pain. Negative for vomiting.  Psychiatric/Behavioral: Positive for sleep disturbance. The patient is nervous/anxious.   All other systems reviewed and are negative.    Physical Exam Updated Vital Signs BP 154/94 (BP Location: Left Arm)   Pulse 62   Temp 98.3 F (36.8 C) (Oral)   Resp 18   Ht 6\' 2"  (1.88 m)   Wt 155 lb (70.3 kg)   SpO2 100%   BMI 19.90 kg/m   Physical Exam  Constitutional: He appears well-developed and well-nourished. No distress.  No hiccups upon exam  HENT:  Head: Normocephalic.  Eyes: Conjunctivae are normal.  Neck: Neck supple.  Cardiovascular: Normal rate and regular rhythm.   No murmur heard. Pulmonary/Chest: Effort normal and breath sounds normal. No respiratory distress.  Abdominal: Soft. There is no tenderness.  No organomegaly.  Neurological: He is alert.  Skin: Skin is warm and dry.  Psychiatric: He has a normal mood and affect.  Nursing note and vitals reviewed.    ED Treatments / Results  DIAGNOSTIC STUDIES: Dg Chest 2 View  Result Date: 12/30/2015 CLINICAL DATA:  30 year old male with hiccups and chest tightness EXAM: CHEST  2 VIEW COMPARISON:  Chest radiograph dated 04/25/2015 FINDINGS: The heart size and mediastinal contours are within normal limits. Both lungs are clear. The visualized skeletal structures  are unremarkable. IMPRESSION: No active cardiopulmonary disease. Electronically Signed   By: Elgie Collard M.D.   On: 12/30/2015 00:50    Oxygen Saturation is 100% on RA, normal by my interpretation.    COORDINATION OF CARE:  12:14 AM Will order DG chest. Discussed treatment plan with pt at bedside and pt agreed to plan.  Labs (all labs ordered are listed, but only abnormal results are displayed) Labs Reviewed - No data to display  EKG  EKG Interpretation None       Radiology No results found.  Procedures Procedures (including critical care time)  Medications Ordered in ED Medications - No data to display   Initial Impression / Assessment and Plan / ED Course  I have reviewed the triage vital signs and the nursing notes.  Pertinent labs & imaging results that were available during my care of the patient were  reviewed by me and considered in my medical decision making (see chart for details).  Clinical Course    Patient presents with persistent hiccups x 9 days. No cough or fever. Not currently symptomatic reporting symptoms are present with activity. CXR clear. Thorazine provided. Will refer to PCP if no better on Thorazine  Final Clinical Impressions(s) / ED Diagnoses   Final diagnoses:  None  1. hiccups   I personally performed the services described in this documentation, which was scribed in my presence. The recorded information has been reviewed and is accurate.   New Prescriptions New Prescriptions   No medications on file     Elpidio Anis, Cordelia Poche 12/30/15 0152    Dione Booze, MD 12/30/15 [phone]

## 2015-12-30 NOTE — ED Notes (Signed)
Patient states throat hurts when swallowing due to duration of hiccups.

## 2016-06-07 ENCOUNTER — Encounter (HOSPITAL_BASED_OUTPATIENT_CLINIC_OR_DEPARTMENT_OTHER): Payer: Self-pay | Admitting: Emergency Medicine

## 2016-06-07 ENCOUNTER — Emergency Department (HOSPITAL_BASED_OUTPATIENT_CLINIC_OR_DEPARTMENT_OTHER): Payer: Commercial Managed Care - PPO

## 2016-06-07 ENCOUNTER — Emergency Department (HOSPITAL_BASED_OUTPATIENT_CLINIC_OR_DEPARTMENT_OTHER)
Admission: EM | Admit: 2016-06-07 | Discharge: 2016-06-08 | Disposition: A | Discharge status: Home / Self Care | Payer: Commercial Managed Care - PPO | Attending: Emergency Medicine | Admitting: Emergency Medicine

## 2016-06-07 DIAGNOSIS — F17210 Nicotine dependence, cigarettes, uncomplicated: Secondary | ICD-10-CM | POA: Insufficient documentation

## 2016-06-07 DIAGNOSIS — Z91040 Latex allergy status: Secondary | ICD-10-CM | POA: Insufficient documentation

## 2016-06-07 DIAGNOSIS — R1013 Epigastric pain: Secondary | ICD-10-CM | POA: Diagnosis not present

## 2016-06-07 DIAGNOSIS — E876 Hypokalemia: Secondary | ICD-10-CM | POA: Insufficient documentation

## 2016-06-07 DIAGNOSIS — R112 Nausea with vomiting, unspecified: Secondary | ICD-10-CM

## 2016-06-07 DIAGNOSIS — Z79899 Other long term (current) drug therapy: Secondary | ICD-10-CM | POA: Diagnosis not present

## 2016-06-07 LAB — URINALYSIS, ROUTINE W REFLEX MICROSCOPIC
BILIRUBIN URINE: NEGATIVE
Glucose, UA: NEGATIVE mg/dL
Ketones, ur: NEGATIVE mg/dL
LEUKOCYTES UA: NEGATIVE
NITRITE: NEGATIVE
Protein, ur: 30 mg/dL — AB
SPECIFIC GRAVITY, URINE: 1.028 (ref 1.005–1.030)
pH: 7 (ref 5.0–8.0)

## 2016-06-07 LAB — HEPATIC FUNCTION PANEL
ALT: 15 U/L — ABNORMAL LOW (ref 17–63)
AST: 26 U/L (ref 15–41)
Albumin: 5.2 g/dL — ABNORMAL HIGH (ref 3.5–5.0)
Alkaline Phosphatase: 66 U/L (ref 38–126)
BILIRUBIN DIRECT: 0.2 mg/dL (ref 0.1–0.5)
BILIRUBIN INDIRECT: 0.9 mg/dL (ref 0.3–0.9)
BILIRUBIN TOTAL: 1.1 mg/dL (ref 0.3–1.2)
Total Protein: 8.7 g/dL — ABNORMAL HIGH (ref 6.5–8.1)

## 2016-06-07 LAB — URINALYSIS, MICROSCOPIC (REFLEX)

## 2016-06-07 LAB — CBC WITH DIFFERENTIAL/PLATELET
BASOS PCT: 0 %
Basophils Absolute: 0 10*3/uL (ref 0.0–0.1)
EOS ABS: 0 10*3/uL (ref 0.0–0.7)
Eosinophils Relative: 0 %
HEMATOCRIT: 38.9 % — AB (ref 39.0–52.0)
HEMOGLOBIN: 14.1 g/dL (ref 13.0–17.0)
Lymphocytes Relative: 26 %
Lymphs Abs: 2.3 10*3/uL (ref 0.7–4.0)
MCH: 33.7 pg (ref 26.0–34.0)
MCHC: 36.2 g/dL — ABNORMAL HIGH (ref 30.0–36.0)
MCV: 92.8 fL (ref 78.0–100.0)
Monocytes Absolute: 1 10*3/uL (ref 0.1–1.0)
Monocytes Relative: 11 %
NEUTROS ABS: 5.7 10*3/uL (ref 1.7–7.7)
NEUTROS PCT: 63 %
Platelets: 150 10*3/uL (ref 150–400)
RBC: 4.19 MIL/uL — AB (ref 4.22–5.81)
RDW: 11.2 % — ABNORMAL LOW (ref 11.5–15.5)
WBC: 9.1 10*3/uL (ref 4.0–10.5)

## 2016-06-07 LAB — BASIC METABOLIC PANEL
ANION GAP: 10 (ref 5–15)
BUN: 17 mg/dL (ref 6–20)
CO2: 31 mmol/L (ref 22–32)
CREATININE: 1.09 mg/dL (ref 0.61–1.24)
Calcium: 9.8 mg/dL (ref 8.9–10.3)
Chloride: 91 mmol/L — ABNORMAL LOW (ref 101–111)
GFR calc non Af Amer: >60 mL/min (ref >60)
Glucose, Bld: 97 mg/dL (ref 65–99)
POTASSIUM: 3.1 mmol/L — AB (ref 3.5–5.1)
SODIUM: 132 mmol/L — AB (ref 135–145)

## 2016-06-07 LAB — LIPASE, BLOOD: Lipase: 30 U/L (ref 11–51)

## 2016-06-07 MED ORDER — POTASSIUM CHLORIDE CRYS ER 20 MEQ PO TBCR
40.0000 meq | EXTENDED_RELEASE_TABLET | Freq: Once | ORAL | Status: AC
  Administered 2016-06-07: 40 meq via ORAL
  Filled 2016-06-07: qty 2

## 2016-06-07 MED ORDER — METOCLOPRAMIDE HCL 10 MG PO TABS: 10.0000 mg | ORAL_TABLET | Freq: Four times a day (QID) | ORAL | 0 refills | Status: DC | PRN

## 2016-06-07 MED ORDER — ONDANSETRON HCL 4 MG/2ML IJ SOLN
4.0000 mg | Freq: Once | INTRAMUSCULAR | Status: AC
  Administered 2016-06-07: 4 mg via INTRAVENOUS
  Filled 2016-06-07: qty 2

## 2016-06-07 NOTE — ED Notes (Signed)
Pt c/o mid abd pain and n/v off and on x 2 weeks

## 2016-06-07 NOTE — ED Triage Notes (Signed)
Patient reports that he has had intermittent nausea and vomiting x 1 week. Reports that today he came in because "he was tired of it"

## 2016-06-07 NOTE — Discharge Instructions (Signed)
Take the medication prescibed as needed for nausea. Make sure that you drink at least six 8 ounce glasses of water or gatorade each day in order to stay well hydrated.  [facility] or the 800 number on your chart to get a primart care doctor.  Returbn if you feel worse or are unable to hold down fluids in your stomach after taking the medicine prescribed. Ask your new doctor to help you to stop smoking

## 2016-06-07 NOTE — ED Provider Notes (Addendum)
[facility] Provider Note   CSN: [id number] Arrival date & time: 06/07/16  1800  By signing my name below, I, Rosario AdieWilliam Andrew Hiatt, attest that this documentation has been prepared under the direction and in the presence of Doug SouSam Keiland Pickering, MD. Electronically Signed: Rosario AdieWilliam Andrew Hiatt, ED Scribe. 06/07/16. 9:39 PM.  History   Chief Complaint Chief Complaint  Patient presents with  . Emesis   The history is provided by the patient. No language interpreter was used.    HPI Comments: Erik HalterDarius Lamantia is a 31 y.o. male who presents to the Emergency Department complaining of persistent nausea and intermittent episodes of vomiting beginning approximately two weeks ago. Pt states that he has vomited ~50 times today prior to his arrival in the ED, with his last episode one hour ago prior to coming here. He notes that since his last episode of vomiting he feels improved. Pt reports associated mild, intermittent epigastric abdominal pain secondary to his nausea/vomiting. Denies blood per rectum or black stools. Denies lightheadedness no fever. No treatment prior to coming here. Nothing makes symptoms better or worse. Pt notes that he has not eaten today prior to coming into the ED, and that he currently feels hungry. He notes that he has a h/o Gastritis, and his symptoms today feels similar. Pt was evaluated by a gastroenterologist for this issue approximately one year ago, but without noted workup. Pt admits to smoking marijuana and cigarettes. He does not drink alcohol. Pt has a prior surgical history to the abdomen including an appendectomy. He denies diarrhea, constipation, or any other associated symptoms.   Past Medical History:  Diagnosis Date  . Bipolar 1 disorder (HCC)   . Bipolar disorder (HCC)   . Cyclic vomiting syndrome   . GERD (gastroesophageal reflux disease)   . Nephrolithiasis   . Schizophrenia, acute [facility](HCC)     Patient Active Problem List   Diagnosis Date Noted  .  Cannabis use disorder, severe, dependence (HCC) 03/19/2015  . Bipolar 1 disorder (HCC) 03/19/2015  . Heart murmur 05/23/2012  . Dehydration, mild 05/23/2012  . Hyponatremia 06/04/2011  . Hypokalemia 06/04/2011  . Leukocytosis 06/04/2011  . GERD (gastroesophageal reflux disease)   . Nephrolithiasis   . Cyclic vomiting syndrome   . Abdominal pain, epigastric 06/10/2010    Past Surgical History:  Procedure Laterality Date  . APPENDECTOMY    . unremarkable         Home Medications    Prior to Admission medications   Medication Sig Start Date End Date Taking? Authorizing Provider  chlorproMAZINE (THORAZINE) 50 MG tablet Take 1 tablet (50 mg total) by mouth 3 (three) times daily as needed for hiccoughs. 12/30/15   Elpidio AnisShari Upstill, PA-C  dicyclomine (BENTYL) 20 MG tablet Take 1 tablet (20 mg total) by mouth 2 (two) times daily. Patient not taking: Reported on 12/29/2015 04/25/15   Ace GinsSerena Y Marcelline Temkin, PA-C  esomeprazole (NEXIUM) 40 MG capsule Take 1 capsule (40 mg total) by mouth daily. Patient not taking: Reported on 12/29/2015 04/22/15   Azalia BilisKevin Campos, MD  omeprazole (PRILOSEC) 20 MG capsule Take 1 capsule (20 mg total) by mouth daily. Patient not taking: Reported on 12/29/2015 06/04/15   Arby BarretteMarcy Pfeiffer, MD  ondansetron (ZOFRAN ODT) 4 MG disintegrating tablet Take 1 tablet (4 mg total) by mouth every 8 (eight) hours as needed for nausea or vomiting. Patient not taking: Reported on 12/29/2015 04/25/15   Ace GinsSerena Y Elica Almas, PA-C  ondansetron (ZOFRAN ODT) 4 MG disintegrating tablet Take 1 tablet (  4 mg total) by mouth every 4 (four) hours as needed for nausea or vomiting. Patient not taking: Reported on 12/29/2015 06/04/15   Arby Barrette, MD  promethazine (PHENERGAN) 25 MG tablet Take 1 tablet (25 mg total) by mouth every 8 (eight) hours as needed for nausea or vomiting. Patient not taking: Reported on 04/25/2015 04/22/15   Azalia Bilis, MD  risperiDONE (RISPERDAL) 1 MG tablet Take 1 tablet (1 mg total) by  mouth at bedtime. Patient not taking: Reported on 12/29/2015 03/20/15   Shuvon B Rankin, NP  sucralfate (CARAFATE) 1 G tablet Take 1 tablet (1 g total) by mouth 4 (four) times daily -  with meals and at bedtime. Patient not taking: Reported on 06/04/2015 04/22/15   Azalia Bilis, MD  sucralfate (CARAFATE) 1 GM/10ML suspension Take 10 mLs (1 g total) by mouth 4 (four) times daily -  with meals and at bedtime. Patient not taking: Reported on 12/29/2015 06/04/15   Arby Barrette, MD  traZODone (DESYREL) 50 MG tablet Take 1 tablet (50 mg total) by mouth at bedtime as needed for sleep. Patient not taking: Reported on 12/29/2015 03/20/15   Talmage Nap, NP    Family History Family History  Problem Relation Age of Onset  . Liver cancer Mother   . Colon cancer Maternal Uncle   . Diabetes Maternal Aunt   . Heart disease Paternal Grandfather   . Testicular cancer Brother     Social History Social History  Substance Use Topics  . Smoking status: Current Every Day Smoker    Years: 1.00    Types: Cigarettes    Last attempt to quit: 06/04/2007  . Smokeless tobacco: Never Used     Comment: see illicit drug comment  . Alcohol use Yes     Comment: occasional-once per month    Denies alcohol use. Admits to marijuana Allergies   Gluten meal; Milk-related compounds; and Latex   Review of Systems Review of Systems  Constitutional: Negative.   HENT: Negative.   Respiratory: Negative.   Cardiovascular: Negative.   Gastrointestinal: Positive for abdominal pain, nausea and vomiting. Negative for constipation and diarrhea.  Musculoskeletal: Negative.   Skin: Negative.   Neurological: Negative.   Psychiatric/Behavioral: Negative.   All other systems reviewed and are negative.  Physical Exam Updated Vital Signs BP 137/89 (BP Location: Right Arm)   Pulse (!) 51   Temp 98.1 F (36.7 C) (Oral)   Resp 16   Ht 6\' 2"  (1.88 m)   Wt 150 lb (68 kg)   SpO2 100%   BMI 19.26 kg/m   Physical Exam    Constitutional: He appears well-developed and well-nourished.  HENT:  Head: Normocephalic and atraumatic.  Eyes: Conjunctivae are normal. Pupils are equal, round, and reactive to light.  Neck: Neck supple. No tracheal deviation present. No thyromegaly present.  Cardiovascular: Normal rate and regular rhythm.   No murmur heard. Pulmonary/Chest: Effort normal and breath sounds normal.  Abdominal: Soft. Bowel sounds are normal. He exhibits no distension. There is tenderness.  Minimally tender at epigastrium  Genitourinary: Penis normal.  Genitourinary Comments: Scrotum normal  Musculoskeletal: Normal range of motion. He exhibits no edema or tenderness.  Neurological: He is alert. Coordination normal.  Skin: Skin is warm and dry. No rash noted.  Psychiatric: He has a normal mood and affect.  Nursing note and vitals reviewed.  ED Treatments / Results  DIAGNOSTIC STUDIES: Oxygen Saturation is 100% on RA, normal by my interpretation.   COORDINATION OF CARE:  9:37 PM-Discussed next steps with pt. Pt verbalized understanding and is agreeable with the plan.   Labs (all labs ordered are listed, but only abnormal results are displayed) Labs Reviewed  CBC WITH DIFFERENTIAL/PLATELET - Abnormal; Notable for the following:       Result Value   RBC 4.19 (*)    HCT 38.9 (*)    MCHC 36.2 (*)    RDW 11.2 (*)    All other components within normal limits  BASIC METABOLIC PANEL - Abnormal; Notable for the following:    Sodium 132 (*)    Potassium 3.1 (*)    Chloride 91 (*)    All other components within normal limits  URINALYSIS, ROUTINE W REFLEX MICROSCOPIC - Abnormal; Notable for the following:    Color, Urine AMBER (*)    Hgb urine dipstick SMALL (*)    Protein, ur 30 (*)    All other components within normal limits  URINALYSIS, MICROSCOPIC (REFLEX) - Abnormal; Notable for the following:    Bacteria, UA RARE (*)    Squamous Epithelial / LPF 0-5 (*)    All other components within normal  limits   EKG  EKG Interpretation None      Radiology No results found.  Procedures Procedures  X-rays viewed by me Results for orders placed or performed during the hospital encounter of 06/07/16  CBC with Differential  Result Value Ref Range   WBC 9.1 4.0 - 10.5 K/uL   RBC 4.19 (L) 4.22 - 5.81 MIL/uL   Hemoglobin 14.1 13.0 - 17.0 g/dL   HCT 16.1 (L) 09.6 - 04.5 %   MCV 92.8 78.0 - 100.0 fL   MCH 33.7 26.0 - 34.0 pg   MCHC 36.2 (H) 30.0 - 36.0 g/dL   RDW 40.9 (L) 81.1 - 91.4 %   Platelets 150 150 - 400 K/uL   Neutrophils Relative % 63 %   Neutro Abs 5.7 1.7 - 7.7 K/uL   Lymphocytes Relative 26 %   Lymphs Abs 2.3 0.7 - 4.0 K/uL   Monocytes Relative 11 %   Monocytes Absolute 1.0 0.1 - 1.0 K/uL   Eosinophils Relative 0 %   Eosinophils Absolute 0.0 0.0 - 0.7 K/uL   Basophils Relative 0 %   Basophils Absolute 0.0 0.0 - 0.1 K/uL  Basic metabolic panel  Result Value Ref Range   Sodium 132 (L) 135 - 145 mmol/L   Potassium 3.1 (L) 3.5 - 5.1 mmol/L   Chloride 91 (L) 101 - 111 mmol/L   CO2 31 22 - 32 mmol/L   Glucose, Bld 97 65 - 99 mg/dL   BUN 17 6 - 20 mg/dL   Creatinine, Ser 7.82 0.61 - 1.24 mg/dL   Calcium 9.8 8.9 - 95.6 mg/dL   GFR calc non Af Amer >60 >60 mL/min   GFR calc Af Amer >60 >60 mL/min   Anion gap 10 5 - 15  Urinalysis, Routine w reflex microscopic  Result Value Ref Range   Color, Urine AMBER (A) YELLOW   APPearance CLEAR CLEAR   Specific Gravity, Urine 1.028 1.005 - 1.030   pH 7.0 5.0 - 8.0   Glucose, UA NEGATIVE NEGATIVE mg/dL   Hgb urine dipstick SMALL (A) NEGATIVE   Bilirubin Urine NEGATIVE NEGATIVE   Ketones, ur NEGATIVE NEGATIVE mg/dL   Protein, ur 30 (A) NEGATIVE mg/dL   Nitrite NEGATIVE NEGATIVE   Leukocytes, UA NEGATIVE NEGATIVE  Urinalysis, Microscopic (reflex)  Result Value Ref Range   RBC / HPF 6-30  0 - 5 RBC/hpf   WBC, UA 0-5 0 - 5 WBC/hpf   Bacteria, UA RARE (A) NONE SEEN   Squamous Epithelial / LPF 0-5 (A) NONE SEEN   Mucous  PRESENT   Lipase, blood  Result Value Ref Range   Lipase 30 11 - 51 U/L  Hepatic function panel  Result Value Ref Range   Total Protein 8.7 (H) 6.5 - 8.1 g/dL   Albumin 5.2 (H) 3.5 - 5.0 g/dL   AST 26 15 - 41 U/L   ALT 15 (L) 17 - 63 U/L   Alkaline Phosphatase 66 38 - 126 U/L   Total Bilirubin 1.1 0.3 - 1.2 mg/dL   Bilirubin, Direct 0.2 0.1 - 0.5 mg/dL   Indirect Bilirubin 0.9 0.3 - 0.9 mg/dL   Dg Abdomen Acute W/chest  Result Date: 06/07/2016 CLINICAL DATA:  Epigastric abdominal pain and vomiting. EXAM: DG ABDOMEN ACUTE W/ 1V CHEST COMPARISON:  Chest radiograph 12/30/2015 FINDINGS: There is no evidence of dilated bowel loops or free intraperitoneal air. No radiopaque calculi or other significant radiographic abnormality is seen. Heart size and mediastinal contours are within normal limits. Both lungs are clear. IMPRESSION: Clear lungs.  No radiographic evidence of obstruction. Electronically Signed   By: Deatra Robinson M.D.   On: 06/07/2016 22:01   Medications Ordered in ED Medications  ondansetron (ZOFRAN) injection 4 mg (4 mg Intravenous Given 06/07/16 2039)   Initial Impression / Assessment and Plan / ED Course  I have reviewed the triage vital signs and the nursing notes.  Pertinent labs & imaging results that were available during my care of the patient were reviewed by me and considered in my medical decision making (see chart for details).  Clinical Course    11:40 PM patient resting comfortably. He is able to drink water without difficulty. No nausea. Plan prescription Reglan. [facility] I counciled pt for 5 minutes on smoking cessation Final Clinical Impressions(s) / ED Diagnoses  Diagnosis #1 epigastric pain #2 vomiting #3 hypokalemia Final diagnoses:  None  #4 tobacco abuse  New Prescriptions New Prescriptions   No medications on file   I personally performed the services described in this documentation, which was scribed in  my presence. The recorded information has been reviewed and considered.    Doug Sou, MD 06/07/16 2349    Doug Sou, MD 06/08/16 0000

## 2016-06-07 NOTE — ED Notes (Signed)
ED Provider at bedside. 

## 2016-09-02 DIAGNOSIS — Z79899 Other long term (current) drug therapy: Secondary | ICD-10-CM | POA: Diagnosis not present

## 2016-09-02 DIAGNOSIS — Z91040 Latex allergy status: Secondary | ICD-10-CM | POA: Insufficient documentation

## 2016-09-02 DIAGNOSIS — R1013 Epigastric pain: Secondary | ICD-10-CM | POA: Insufficient documentation

## 2016-09-02 DIAGNOSIS — R112 Nausea with vomiting, unspecified: Secondary | ICD-10-CM | POA: Insufficient documentation

## 2016-09-02 DIAGNOSIS — F17210 Nicotine dependence, cigarettes, uncomplicated: Secondary | ICD-10-CM | POA: Diagnosis not present

## 2016-09-02 MED ORDER — ONDANSETRON 4 MG PO TBDP
4.0000 mg | ORAL_TABLET | Freq: Once | ORAL | Status: AC | PRN
  Administered 2016-09-03: 4 mg via ORAL
  Filled 2016-09-02: qty 1

## 2016-09-02 NOTE — ED Triage Notes (Signed)
Pt states that he has acid reflux and has been out of his medication x 4 days and has been vomiting since that time. Alert and oriented.

## 2016-09-03 ENCOUNTER — Emergency Department (HOSPITAL_COMMUNITY): Payer: Commercial Managed Care - PPO

## 2016-09-03 ENCOUNTER — Emergency Department (HOSPITAL_COMMUNITY)
Admission: EM | Admit: 2016-09-03 | Discharge: 2016-09-03 | Disposition: A | Discharge status: Home / Self Care | Payer: Commercial Managed Care - PPO | Attending: Emergency Medicine | Admitting: Emergency Medicine

## 2016-09-03 DIAGNOSIS — R112 Nausea with vomiting, unspecified: Secondary | ICD-10-CM

## 2016-09-03 DIAGNOSIS — R1013 Epigastric pain: Secondary | ICD-10-CM

## 2016-09-03 LAB — COMPREHENSIVE METABOLIC PANEL
ALT: 25 U/L (ref 17–63)
AST: 29 U/L (ref 15–41)
Albumin: 5.1 g/dL — ABNORMAL HIGH (ref 3.5–5.0)
Alkaline Phosphatase: 65 U/L (ref 38–126)
Anion gap: 10 (ref 5–15)
BUN: 25 mg/dL — ABNORMAL HIGH (ref 6–20)
CHLORIDE: 101 mmol/L (ref 101–111)
CO2: 24 mmol/L (ref 22–32)
CREATININE: 0.94 mg/dL (ref 0.61–1.24)
Calcium: 10.5 mg/dL — ABNORMAL HIGH (ref 8.9–10.3)
Glucose, Bld: 128 mg/dL — ABNORMAL HIGH (ref 65–99)
Potassium: 3.9 mmol/L (ref 3.5–5.1)
SODIUM: 135 mmol/L (ref 135–145)
Total Bilirubin: 1 mg/dL (ref 0.3–1.2)
Total Protein: 8.9 g/dL — ABNORMAL HIGH (ref 6.5–8.1)

## 2016-09-03 LAB — LIPASE, BLOOD: LIPASE: 15 U/L (ref 11–51)

## 2016-09-03 LAB — CBC
HEMATOCRIT: 35.3 % — AB (ref 39.0–52.0)
HEMOGLOBIN: 12.8 g/dL — AB (ref 13.0–17.0)
MCH: 34 pg (ref 26.0–34.0)
MCHC: 36.3 g/dL — ABNORMAL HIGH (ref 30.0–36.0)
MCV: 93.6 fL (ref 78.0–100.0)
PLATELETS: 167 10*3/uL (ref 150–400)
RBC: 3.77 MIL/uL — AB (ref 4.22–5.81)
RDW: 11.9 % (ref 11.5–15.5)
WBC: 8.4 10*3/uL (ref 4.0–10.5)

## 2016-09-03 LAB — POC OCCULT BLOOD, ED: Fecal Occult Bld: NEGATIVE

## 2016-09-03 MED ORDER — HALOPERIDOL LACTATE 5 MG/ML IJ SOLN
2.0000 mg | Freq: Once | INTRAMUSCULAR | Status: AC
  Administered 2016-09-03: 2 mg via INTRAVENOUS
  Filled 2016-09-03: qty 1

## 2016-09-03 MED ORDER — CAPSAICIN 0.025 % EX CREA
TOPICAL_CREAM | Freq: Once | CUTANEOUS | Status: AC
  Administered 2016-09-03: 06:00:00 via TOPICAL
  Filled 2016-09-03: qty 60

## 2016-09-03 MED ORDER — GI COCKTAIL ~~LOC~~
30.0000 mL | Freq: Once | ORAL | Status: AC
  Administered 2016-09-03: 30 mL via ORAL
  Filled 2016-09-03: qty 30

## 2016-09-03 MED ORDER — PROMETHAZINE HCL 25 MG/ML IJ SOLN
25.0000 mg | Freq: Once | INTRAMUSCULAR | Status: AC
  Administered 2016-09-03: 25 mg via INTRAVENOUS
  Filled 2016-09-03: qty 1

## 2016-09-03 MED ORDER — FAMOTIDINE IN NACL 20-0.9 MG/50ML-% IV SOLN
20.0000 mg | Freq: Once | INTRAVENOUS | Status: AC
  Administered 2016-09-03: 20 mg via INTRAVENOUS
  Filled 2016-09-03: qty 50

## 2016-09-03 MED ORDER — ONDANSETRON 4 MG PO TBDP: 4.0000 mg | ORAL_TABLET | Freq: Three times a day (TID) | ORAL | 0 refills | Status: DC | PRN

## 2016-09-03 MED ORDER — SODIUM CHLORIDE 0.9 % IV BOLUS (SEPSIS)
1000.0000 mL | Freq: Once | INTRAVENOUS | Status: AC
  Administered 2016-09-03: 1000 mL via INTRAVENOUS

## 2016-09-03 NOTE — ED Notes (Signed)
Pt states that the has had vomiting non stop x 1 week with abdomen pain 10/10 described as stabbing. PT observed vomiting 3x brown emesis. Pt states that he has ha hx of ulcers and feels that they are the trigger. Pt reports that he has not been able to keep any thing down. Pt report  2 episodes of diarrhea today.

## 2016-09-03 NOTE — ED Notes (Signed)
Pt stated that he has not left and is outside if called for a room.

## 2016-09-03 NOTE — ED Notes (Signed)
Pt is able to tolerate  drinking water without nausea

## 2016-09-03 NOTE — ED Provider Notes (Signed)
[facility]-EMERGENCY DEPT Provider Note   CSN: [id number] Arrival date & time: 09/02/16  2352     History   Chief Complaint Chief Complaint  Patient presents with  . Emesis    HPI Erik Cowan is a 31 y.o. male.  The history is provided by the patient and medical records. No language interpreter was used.  Emesis   Associated symptoms include abdominal pain and diarrhea. Pertinent negatives include no chills, no cough, no fever and no headaches.   Erik Cowan is a 31 y.o. male  with a PMH of cyclical vomiting, schizophrenia, bipolar disorder, GERD who presents to the Emergency Department complaining of worsening stabbing epigastric abdominal pain 1 week associated with nausea. Started vomiting over for the last 4 days. + diarrhea which began today. Followed by GI who per patient, did EGD and told him he had an ulcer. He took tylenol and advil at home with no relief. States he has been unable to keep food or water down in the last four days. No blood in the stool or emesis. Patient states he has been seen for similar multiple times in the past and this feels the same. Patient states that he used to smoke marijuana, but has now quit.   Past Medical History:  Diagnosis Date  . Bipolar 1 disorder (HCC)   . Bipolar disorder (HCC)   . Cyclic vomiting syndrome   . GERD (gastroesophageal reflux disease)   . Nephrolithiasis   . Schizophrenia, acute [facility])     Patient Active Problem List   Diagnosis Date Noted  . Cannabis use disorder, severe, dependence (HCC) 03/19/2015  . Bipolar 1 disorder (HCC) 03/19/2015  . Heart murmur 05/23/2012  . Dehydration, mild 05/23/2012  . Hyponatremia 06/04/2011  . Hypokalemia 06/04/2011  . Leukocytosis 06/04/2011  . GERD (gastroesophageal reflux disease)   . Nephrolithiasis   . Cyclic vomiting syndrome   . Abdominal pain, epigastric 06/10/2010    Past Surgical History:  Procedure Laterality Date  . APPENDECTOMY    . unremarkable          Home Medications    Prior to Admission medications   Medication Sig Start Date End Date Taking? Authorizing Provider  chlorproMAZINE (THORAZINE) 50 MG tablet Take 1 tablet (50 mg total) by mouth 3 (three) times daily as needed for hiccoughs. Patient not taking: Reported on 09/03/2016 12/30/15   Elpidio Anis, PA-C  dicyclomine (BENTYL) 20 MG tablet Take 1 tablet (20 mg total) by mouth 2 (two) times daily. Patient not taking: Reported on 12/29/2015 04/25/15   Ace Gins Sam, PA-C  esomeprazole (NEXIUM) 40 MG capsule Take 1 capsule (40 mg total) by mouth daily. Patient not taking: Reported on 12/29/2015 04/22/15   Azalia Bilis, MD  metoCLOPramide (REGLAN) 10 MG tablet Take 1 tablet (10 mg total) by mouth every 6 (six) hours as needed for nausea or vomiting. Patient not taking: Reported on 09/03/2016 06/07/16   Doug Sou, MD  omeprazole (PRILOSEC) 20 MG capsule Take 1 capsule (20 mg total) by mouth daily. Patient not taking: Reported on 12/29/2015 06/04/15   Arby Barrette, MD  ondansetron (ZOFRAN ODT) 4 MG disintegrating tablet Take 1 tablet (4 mg total) by mouth every 8 (eight) hours as needed for nausea or vomiting. 09/03/16   Chase Picket Nihal Doan, PA-C  promethazine (PHENERGAN) 25 MG tablet Take 1 tablet (25 mg total) by mouth every 8 (eight) hours as needed for nausea or vomiting. Patient not taking: Reported on 04/25/2015 04/22/15   Azalia Bilis, MD  risperiDONE (RISPERDAL) 1 MG tablet Take 1 tablet (1 mg total) by mouth at bedtime. Patient not taking: Reported on 12/29/2015 03/20/15   Shuvon B Rankin, NP  sucralfate (CARAFATE) 1 G tablet Take 1 tablet (1 g total) by mouth 4 (four) times daily -  with meals and at bedtime. Patient not taking: Reported on 06/04/2015 04/22/15   Azalia Bilis, MD  sucralfate (CARAFATE) 1 GM/10ML suspension Take 10 mLs (1 g total) by mouth 4 (four) times daily -  with meals and at bedtime. Patient not taking: Reported on 12/29/2015 06/04/15   Arby Barrette, MD   traZODone (DESYREL) 50 MG tablet Take 1 tablet (50 mg total) by mouth at bedtime as needed for sleep. Patient not taking: Reported on 12/29/2015 03/20/15   Talmage Nap, NP    Family History Family History  Problem Relation Age of Onset  . Liver cancer Mother   . Colon cancer Maternal Uncle   . Diabetes Maternal Aunt   . Heart disease Paternal Grandfather   . Testicular cancer Brother     Social History Social History  Substance Use Topics  . Smoking status: Current Every Day Smoker    Years: 1.00    Types: Cigarettes    Last attempt to quit: 06/04/2007  . Smokeless tobacco: Never Used     Comment: see illicit drug comment  . Alcohol use Yes     Comment: occasional-once per month     Allergies   Gluten meal; Milk-related compounds; and Latex   Review of Systems Review of Systems  Constitutional: Negative for chills and fever.  HENT: Negative for congestion.   Eyes: Negative for visual disturbance.  Respiratory: Negative for cough and shortness of breath.   Cardiovascular: Negative for chest pain.  Gastrointestinal: Positive for abdominal pain, diarrhea, nausea and vomiting. Negative for blood in stool.  Genitourinary: Negative for dysuria.  Musculoskeletal: Negative for back pain and neck pain.  Skin: Negative for rash.  Neurological: Negative for headaches.     Physical Exam Updated Vital Signs BP 111/64 (BP Location: Left Arm)   Pulse (!) 53   Temp 98.4 F (36.9 C) (Oral)   Resp 16   Ht  (1.88 m)   Wt 68 kg   SpO2 97%   BMI 19.26 kg/m   Physical Exam  Constitutional: He is oriented to person, place, and time. He appears well-developed and well-nourished. No distress.  HENT:  Head: Normocephalic and atraumatic.  Tacky mucus membranes.   Cardiovascular: Normal rate, regular rhythm and normal heart sounds.   No murmur heard. Pulmonary/Chest: Effort normal and breath sounds normal. No respiratory distress. He has no wheezes. He has no rales.   Abdominal: Soft. Bowel sounds are normal. He exhibits no distension. There is tenderness (Epigastric).  Musculoskeletal: Normal range of motion.  Neurological: He is alert and oriented to person, place, and time.  Skin: Skin is warm and dry.  Nursing note and vitals reviewed.    ED Treatments / Results  Labs (all labs ordered are listed, but only abnormal results are displayed) Labs Reviewed  COMPREHENSIVE METABOLIC PANEL - Abnormal; Notable for the following:       Result Value   Glucose, Bld 128 (*)    BUN 25 (*)    Calcium 10.5 (*)    Total Protein 8.9 (*)    Albumin 5.1 (*)    All other components within normal limits  CBC - Abnormal; Notable for the following:    RBC 3.77 (*)  Hemoglobin 12.8 (*)    HCT 35.3 (*)    MCHC 36.3 (*)    All other components within normal limits  LIPASE, BLOOD  POC OCCULT BLOOD, ED    EKG  EKG Interpretation None       Radiology US Abdomen Limited Ruq  Result Date: 09/03/2016 CLINICAL DATA:  Epigastric pain.  History of GERD. EXAM: US ABDOMEN LIMITED - RIGHT UPPER QUADRANT COMPARISON:  None. FINDINGS: Gallbladder: No gallstones or wall thickening visualized. No sonographic Murphy sign noted by sonographer. Common bile duct: Diameter: 3.2 mm proximally and 2.1 mm distally. No choledocholithiasis. Liver: No focal lesion identified. Within normal limits in parenchymal echogenicity. IMPRESSION: Unremarkable right upper quadrant abdominal ultrasound. Electronically Signed   By: Tollie Eth M.D.   On: 09/03/2016 04:08    Procedures Procedures (including critical care time)  Medications Ordered in ED Medications  ondansetron (ZOFRAN-ODT) disintegrating tablet 4 mg (4 mg Oral Given 09/03/16 0003)  sodium chloride 0.9 % bolus 1,000 mL (0 mLs Intravenous Stopped 09/03/16 0511)  promethazine (PHENERGAN) injection 25 mg (25 mg Intravenous Given 09/03/16 0332)  famotidine (PEPCID) IVPB 20 mg premix (0 mg Intravenous Stopped 09/03/16 0511)  gi  cocktail (Maalox,Lidocaine,Donnatal) (30 mLs Oral Given 09/03/16 0517)  haloperidol lactate (HALDOL) injection 2 mg (2 mg Intravenous Given 09/03/16 0516)  capsaicin (ZOSTRIX) 0.025 % cream ( Topical Given 09/03/16 0981)     Initial Impression / Assessment and Plan / ED Course  I have reviewed the triage vital signs and the nursing notes.  Pertinent labs & imaging results that were available during my care of the patient were reviewed by me and considered in my medical decision making (see chart for details).      Erik Cowan is a 31 y.o. male who presents to ED for epigastric abdominal pain, nausea and vomiting x 1 week. Patient states this feels c/w his flares of peptic ulcer disease which was diagnosed by EGD. Of note, patient has also been seen several times in the past for similar cyclical vomiting episodes thought to be due to marijuana use. Patient states that he has not smoked marijuana in the last month. Right upper quadrant ultrasound unremarkable. Hemoccult negative. Mild anemia with hemoglobin of 12.8. Fluids, nausea meds and Pepcid given with relief of emesis. Patient still complaining of abdominal pain. Haldol, GI cocktail and capsaicin cream given with resolution of symptoms. Patient is tolerating PO and feels comfortable with discharge to home. Strongly encouraged patient to discontinue NSAIDs such as ibuprofen with history of peptic ulcers. Encouraged him to follow up with his GI doctor. Continue taking his Prilosec daily. Prescription for Zofran given. Reasons to return to ER discussed and all questions answered.   Final Clinical Impressions(s) / ED Diagnoses   Final diagnoses:  Epigastric abdominal pain  Non-intractable vomiting with nausea, unspecified vomiting type    New Prescriptions New Prescriptions   ONDANSETRON (ZOFRAN ODT) 4 MG DISINTEGRATING TABLET    Take 1 tablet (4 mg total) by mouth every 8 (eight) hours as needed for nausea or vomiting.     [facility]  Roosevelt Eimers, PA-C 09/03/16 1914    Paula Libra, MD 09/03/16 [id number]

## 2016-09-03 NOTE — Discharge Instructions (Signed)
It was my pleasure taking care of you today!   Apply capsaicin cream to abdomen as needed. Zofran as needed for nausea.   Please follow up with your primary doctor days for further discussion about your hospital visit today. If you do not have a primary doctor, call the clinic listed to schedule a follow up appointment.   Please seek immediate care if you develop any of the following symptoms: The pain does not go away.  You have a fever.  You keep throwing up and can't keep fluids down. You pass bloody or black tarry stools.  There is bright red blood in the stool.  There is rectal pain.  You do not seem to be getting better.  You have any questions or concerns.

## 2016-09-03 NOTE — ED Notes (Addendum)
Looked for pt inside and out front of lobby to reassess vitals x1.

## 2016-09-03 NOTE — ED Notes (Signed)
Pt. Made aware for the need of urine specimen. 

## 2016-09-12 ENCOUNTER — Encounter (HOSPITAL_BASED_OUTPATIENT_CLINIC_OR_DEPARTMENT_OTHER): Payer: Self-pay | Admitting: *Deleted

## 2016-09-12 ENCOUNTER — Emergency Department (HOSPITAL_BASED_OUTPATIENT_CLINIC_OR_DEPARTMENT_OTHER)
Admission: EM | Admit: 2016-09-12 | Discharge: 2016-09-13 | Disposition: A | Discharge status: Home / Self Care | Payer: Commercial Managed Care - PPO | Attending: Emergency Medicine | Admitting: Emergency Medicine

## 2016-09-12 DIAGNOSIS — R066 Hiccough: Secondary | ICD-10-CM | POA: Insufficient documentation

## 2016-09-12 DIAGNOSIS — F17210 Nicotine dependence, cigarettes, uncomplicated: Secondary | ICD-10-CM | POA: Diagnosis not present

## 2016-09-12 DIAGNOSIS — R1013 Epigastric pain: Secondary | ICD-10-CM | POA: Insufficient documentation

## 2016-09-12 DIAGNOSIS — R112 Nausea with vomiting, unspecified: Secondary | ICD-10-CM

## 2016-09-12 DIAGNOSIS — R109 Unspecified abdominal pain: Secondary | ICD-10-CM

## 2016-09-12 DIAGNOSIS — Z79899 Other long term (current) drug therapy: Secondary | ICD-10-CM | POA: Diagnosis not present

## 2016-09-12 LAB — CBC WITH DIFFERENTIAL/PLATELET
Basophils Absolute: 0 10*3/uL (ref 0.0–0.1)
Basophils Relative: 0 %
Eosinophils Absolute: 0.1 10*3/uL (ref 0.0–0.7)
Eosinophils Relative: 1 %
HCT: 31.7 % — ABNORMAL LOW (ref 39.0–52.0)
Hemoglobin: 11.3 g/dL — ABNORMAL LOW (ref 13.0–17.0)
Lymphocytes Relative: 30 %
Lymphs Abs: 2.1 10*3/uL (ref 0.7–4.0)
MCH: 34.1 pg — ABNORMAL HIGH (ref 26.0–34.0)
MCHC: 35.6 g/dL (ref 30.0–36.0)
MCV: 95.8 fL (ref 78.0–100.0)
Monocytes Absolute: 0.7 10*3/uL (ref 0.1–1.0)
Monocytes Relative: 10 %
Neutro Abs: 4.3 10*3/uL (ref 1.7–7.7)
Neutrophils Relative %: 59 %
Platelets: 198 10*3/uL (ref 150–400)
RBC: 3.31 MIL/uL — ABNORMAL LOW (ref 4.22–5.81)
RDW: 11.3 % — ABNORMAL LOW (ref 11.5–15.5)
WBC: 7.2 10*3/uL (ref 4.0–10.5)

## 2016-09-12 LAB — COMPREHENSIVE METABOLIC PANEL
ALK PHOS: 58 U/L (ref 38–126)
ALT: 13 U/L — ABNORMAL LOW (ref 17–63)
ANION GAP: 7 (ref 5–15)
AST: 16 U/L (ref 15–41)
Albumin: 3.7 g/dL (ref 3.5–5.0)
BILIRUBIN TOTAL: 0.2 mg/dL — AB (ref 0.3–1.2)
BUN: 14 mg/dL (ref 6–20)
CALCIUM: 8.7 mg/dL — AB (ref 8.9–10.3)
CO2: 26 mmol/L (ref 22–32)
Chloride: 101 mmol/L (ref 101–111)
Creatinine, Ser: 0.86 mg/dL (ref 0.61–1.24)
GFR calc Af Amer: >60 mL/min (ref >60)
Glucose, Bld: 98 mg/dL (ref 65–99)
Potassium: 3.9 mmol/L (ref 3.5–5.1)
Sodium: 134 mmol/L — ABNORMAL LOW (ref 135–145)
TOTAL PROTEIN: 6.2 g/dL — AB (ref 6.5–8.1)

## 2016-09-12 LAB — LIPASE, BLOOD: LIPASE: 18 U/L (ref 11–51)

## 2016-09-12 MED ORDER — LORAZEPAM 2 MG/ML IJ SOLN
1.0000 mg | Freq: Once | INTRAMUSCULAR | Status: AC
  Administered 2016-09-12: 1 mg via INTRAVENOUS
  Filled 2016-09-12: qty 1

## 2016-09-12 MED ORDER — PANTOPRAZOLE SODIUM 40 MG IV SOLR
40.0000 mg | Freq: Once | INTRAVENOUS | Status: AC
  Administered 2016-09-12: 40 mg via INTRAVENOUS
  Filled 2016-09-12: qty 40

## 2016-09-12 MED ORDER — GI COCKTAIL ~~LOC~~
30.0000 mL | Freq: Once | ORAL | Status: AC
  Administered 2016-09-12: 30 mL via ORAL
  Filled 2016-09-12: qty 30

## 2016-09-12 MED ORDER — SODIUM CHLORIDE 0.9 % IV BOLUS (SEPSIS)
1000.0000 mL | Freq: Once | INTRAVENOUS | Status: AC
  Administered 2016-09-12: 1000 mL via INTRAVENOUS

## 2016-09-12 MED ORDER — HALOPERIDOL LACTATE 5 MG/ML IJ SOLN
2.0000 mg | Freq: Once | INTRAMUSCULAR | Status: AC
Start: 2016-09-12 — End: 2016-09-12
  Administered 2016-09-12: 2 mg via INTRAVENOUS
  Filled 2016-09-12: qty 1

## 2016-09-12 MED ORDER — FAMOTIDINE IN NACL 20-0.9 MG/50ML-% IV SOLN
20.0000 mg | Freq: Once | INTRAVENOUS | Status: AC
  Administered 2016-09-12: 20 mg via INTRAVENOUS
  Filled 2016-09-12: qty 50

## 2016-09-12 NOTE — ED Triage Notes (Signed)
Here for nv, and epigastric pain, ongoing for 1 week, seen here for the same previously, relates sx to acid reflux or ulcers, h/o marijuana use and cyclic nv, took zofran this am "not helping". Much belching/ hiccups at present. Has tried no other meds PTA. States, "quit marijuana 2 months ago".  Alert, NAD, calm, interactive, resps e/u, speaking in clear complete sentences, no dyspnea noted, skin W&D, VSS, c/o upper mid abd pain, and sob with hiccups, also nv, (denies: dizziness or visual changes). Family at [facility].

## 2016-09-12 NOTE — ED Notes (Signed)
In to re-draw blood, pt c/o anxiety (recent haldol given), also reports "feel a little better otherwise", denies nausea at this time, "belching and hiccups improved".

## 2016-09-12 NOTE — ED Provider Notes (Signed)
[facility] Note   CSN: [id number] Arrival date & time: 09/12/16  2207   By signing my name below, I, Teofilo Pod, attest that this documentation has been prepared under the direction and in the presence of Raeford Razor, MD . Electronically Signed: Teofilo Pod, ED Scribe. 09/12/2016. 10:34 PM.   History   Chief Complaint Chief Complaint  Patient presents with  . Emesis   The history is provided by the patient. No language interpreter was used.   HPI Comments:  Erik Cowan is a 31 y.o. male who presents to the Emergency Department complaining of multiple episodes of vomiting over the past 2-3 weeks. Pt complains of associated abdominal pain. Pt reports that he smokes marijuana "every other day" but has cut back on his usage since onset of symptoms. Pt was seen at [facility] ED for the same 10 days ago and was treated with Zofran that provided mild relief for nausea. Denies diarrhea, fever.    Past Medical History:  Diagnosis Date  . Bipolar 1 disorder (HCC)   . Bipolar disorder (HCC)   . Cyclic vomiting syndrome   . GERD (gastroesophageal reflux disease)   . Nephrolithiasis   . Schizophrenia, acute     Patient Active Problem List   Diagnosis Date Noted  . Cannabis use disorder, severe, dependence (HCC) 03/19/2015  . Bipolar 1 disorder (HCC) 03/19/2015  . Heart murmur 05/23/2012  . Dehydration, mild 05/23/2012  . Hyponatremia 06/04/2011  . Hypokalemia 06/04/2011  . Leukocytosis 06/04/2011  . GERD (gastroesophageal reflux disease)   . Nephrolithiasis   . Cyclic vomiting syndrome   . Abdominal pain, epigastric 06/10/2010    Past Surgical History:  Procedure Laterality Date  . APPENDECTOMY    . unremarkable         Home Medications    Prior to Admission medications   Medication Sig Start Date End Date Taking? Authorizing Provider  chlorproMAZINE (THORAZINE) 50 MG tablet Take 1 tablet (50 mg total) by mouth 3 (three) times daily  as needed for hiccoughs. Patient not taking: Reported on 09/03/2016 12/30/15   Elpidio Anis, PA-C  dicyclomine (BENTYL) 20 MG tablet Take 1 tablet (20 mg total) by mouth 2 (two) times daily. Patient not taking: Reported on 12/29/2015 04/25/15   Ace Gins Sam, PA-C  esomeprazole (NEXIUM) 40 MG capsule Take 1 capsule (40 mg total) by mouth daily. Patient not taking: Reported on 12/29/2015 04/22/15   Azalia Bilis, MD  metoCLOPramide (REGLAN) 10 MG tablet Take 1 tablet (10 mg total) by mouth every 6 (six) hours as needed for nausea or vomiting. Patient not taking: Reported on 09/03/2016 06/07/16   Doug Sou, MD  omeprazole (PRILOSEC) 20 MG capsule Take 1 capsule (20 mg total) by mouth daily. Patient not taking: Reported on 12/29/2015 06/04/15   Arby Barrette, MD  ondansetron (ZOFRAN ODT) 4 MG disintegrating tablet Take 1 tablet (4 mg total) by mouth every 8 (eight) hours as needed for nausea or vomiting. 09/03/16   Chase Picket Ward, PA-C  promethazine (PHENERGAN) 25 MG tablet Take 1 tablet (25 mg total) by mouth every 8 (eight) hours as needed for nausea or vomiting. Patient not taking: Reported on 04/25/2015 04/22/15   Azalia Bilis, MD  risperiDONE (RISPERDAL) 1 MG tablet Take 1 tablet (1 mg total) by mouth at bedtime. Patient not taking: Reported on 12/29/2015 03/20/15   Shuvon B Rankin, NP  sucralfate (CARAFATE) 1 G tablet Take 1 tablet (1 g total) by mouth 4 (four)  times daily -  with meals and at bedtime. Patient not taking: Reported on 06/04/2015 04/22/15   Azalia Bilis, MD  sucralfate (CARAFATE) 1 GM/10ML suspension Take 10 mLs (1 g total) by mouth 4 (four) times daily -  with meals and at bedtime. Patient not taking: Reported on 12/29/2015 06/04/15   Arby Barrette, MD  traZODone (DESYREL) 50 MG tablet Take 1 tablet (50 mg total) by mouth at bedtime as needed for sleep. Patient not taking: Reported on 12/29/2015 03/20/15   Talmage Nap, NP    Family History Family History  Problem Relation Age of  Onset  . Liver cancer Mother   . Colon cancer Maternal Uncle   . Diabetes Maternal Aunt   . Heart disease Paternal Grandfather   . Testicular cancer Brother     Social History Social History  Substance Use Topics  . Smoking status: Current Every Day Smoker    Years: 1.00    Types: Cigarettes    Last attempt to quit: 06/04/2007  . Smokeless tobacco: Never Used     Comment: see illicit drug comment  . Alcohol use Yes     Comment: occasional-once per month     Allergies   Gluten meal; Milk-related compounds; and Latex   Review of Systems Review of Systems  Constitutional: Negative for fever.  Gastrointestinal: Positive for abdominal pain, nausea and vomiting. Negative for diarrhea.  All other systems reviewed and are negative.    Physical Exam Updated Vital Signs BP 139/79 (BP Location: Left Arm)   Pulse 64   Temp 98 F (36.7 C) (Oral)   Resp 19   Ht  (1.88 m)   Wt 145 lb (65.8 kg)   SpO2 99%   BMI 18.62 kg/m   Physical Exam  Constitutional: He is oriented to person, place, and time. He appears well-developed and well-nourished.  HENT:  Head: Normocephalic and atraumatic.  Eyes: EOM are normal.  Neck: Normal range of motion.  Cardiovascular: Normal rate, regular rhythm, normal heart sounds and intact distal pulses.   Pulmonary/Chest: Effort normal and breath sounds normal. No respiratory distress.  Abdominal: Soft. He exhibits no distension. There is tenderness.  Frequent hiccupping, mild epigastric tenderness.   Musculoskeletal: Normal range of motion.  Neurological: He is alert and oriented to person, place, and time.  Skin: Skin is warm and dry.  Psychiatric: He has a normal mood and affect. Judgment normal.  Nursing note and vitals reviewed.    ED Treatments / Results  DIAGNOSTIC STUDIES:  Oxygen Saturation is 99% on RA, normal by my interpretation.    COORDINATION OF CARE:  10:31 PM Discussed treatment plan with pt at bedside and pt agreed  to plan.   Labs (all labs ordered are listed, but only abnormal results are displayed) Labs Reviewed - No data to display  EKG  EKG Interpretation None       Radiology No results found.  Procedures Procedures (including critical care time)  Medications Ordered in ED Medications - No data to display   Initial Impression / Assessment and Plan / ED Course  I have reviewed the triage vital signs and the nursing notes.  Pertinent labs & imaging results that were available during my care of the patient were reviewed by me and considered in my medical decision making (see chart for details).   Final Clinical Impressions(s) / ED Diagnoses   Final diagnoses:  Abdominal pain, unspecified abdominal location  Nausea and vomiting, intractability of vomiting not specified,  unspecified vomiting type    New Prescriptions New Prescriptions   No medications on file    I personally preformed the services scribed in my presence. The recorded information has been reviewed is accurate. Raeford Razor, MD.     Raeford Razor, MD 09/23/16 [phone]

## 2016-09-12 NOTE — ED Notes (Signed)
Alert, NAD, restless, anxious, interactive, resps e/u, speaking in clear complete sentences, no dyspnea noted, skin W&D, (denies: sob, nausea, dizziness or visual changes). [facility] at [facility].

## 2016-09-13 MED ORDER — PROMETHAZINE HCL 25 MG PO TABS: 25.0000 mg | ORAL_TABLET | Freq: Three times a day (TID) | ORAL | 0 refills | Status: DC | PRN

## 2016-11-30 ENCOUNTER — Emergency Department (HOSPITAL_COMMUNITY)
Admission: EM | Admit: 2016-11-30 | Discharge: 2016-11-30 | Disposition: A | Discharge status: Home / Self Care | Payer: Commercial Managed Care - PPO | Attending: Emergency Medicine | Admitting: Emergency Medicine

## 2016-11-30 ENCOUNTER — Encounter (HOSPITAL_COMMUNITY): Payer: Self-pay | Admitting: Emergency Medicine

## 2016-11-30 DIAGNOSIS — R197 Diarrhea, unspecified: Secondary | ICD-10-CM | POA: Insufficient documentation

## 2016-11-30 DIAGNOSIS — F17210 Nicotine dependence, cigarettes, uncomplicated: Secondary | ICD-10-CM | POA: Insufficient documentation

## 2016-11-30 DIAGNOSIS — R112 Nausea with vomiting, unspecified: Secondary | ICD-10-CM

## 2016-11-30 DIAGNOSIS — Z91040 Latex allergy status: Secondary | ICD-10-CM | POA: Insufficient documentation

## 2016-11-30 DIAGNOSIS — G43A1 Cyclical vomiting, intractable: Secondary | ICD-10-CM | POA: Insufficient documentation

## 2016-11-30 DIAGNOSIS — R1115 Cyclical vomiting syndrome unrelated to migraine: Secondary | ICD-10-CM

## 2016-11-30 LAB — CBC
HEMATOCRIT: 33.4 % — AB (ref 39.0–52.0)
Hemoglobin: 12.2 g/dL — ABNORMAL LOW (ref 13.0–17.0)
MCH: 34.3 pg — AB (ref 26.0–34.0)
MCHC: 36.5 g/dL — AB (ref 30.0–36.0)
MCV: 93.8 fL (ref 78.0–100.0)
Platelets: 171 10*3/uL (ref 150–400)
RBC: 3.56 MIL/uL — ABNORMAL LOW (ref 4.22–5.81)
RDW: 12 % (ref 11.5–15.5)
WBC: 8.1 10*3/uL (ref 4.0–10.5)

## 2016-11-30 LAB — COMPREHENSIVE METABOLIC PANEL
ALBUMIN: 5 g/dL (ref 3.5–5.0)
ALK PHOS: 63 U/L (ref 38–126)
ALT: 32 U/L (ref 17–63)
AST: 24 U/L (ref 15–41)
Anion gap: 9 (ref 5–15)
BILIRUBIN TOTAL: 0.7 mg/dL (ref 0.3–1.2)
BUN: 19 mg/dL (ref 6–20)
CO2: 29 mmol/L (ref 22–32)
Calcium: 10 mg/dL (ref 8.9–10.3)
Chloride: 99 mmol/L — ABNORMAL LOW (ref 101–111)
Creatinine, Ser: 0.95 mg/dL (ref 0.61–1.24)
GFR calc Af Amer: >60 mL/min (ref >60)
GFR calc non Af Amer: >60 mL/min (ref >60)
GLUCOSE: 120 mg/dL — AB (ref 65–99)
POTASSIUM: 3.4 mmol/L — AB (ref 3.5–5.1)
Sodium: 137 mmol/L (ref 135–145)
TOTAL PROTEIN: 8.3 g/dL — AB (ref 6.5–8.1)

## 2016-11-30 LAB — LIPASE, BLOOD: Lipase: 120 U/L — ABNORMAL HIGH (ref 11–51)

## 2016-11-30 MED ORDER — HYDROMORPHONE HCL 1 MG/ML IJ SOLN
1.0000 mg | Freq: Once | INTRAMUSCULAR | Status: AC
  Administered 2016-11-30: 1 mg via INTRAVENOUS
  Filled 2016-11-30: qty 1

## 2016-11-30 MED ORDER — LORAZEPAM 2 MG/ML IJ SOLN
1.0000 mg | Freq: Once | INTRAMUSCULAR | Status: AC
  Administered 2016-11-30: 1 mg via INTRAVENOUS
  Filled 2016-11-30: qty 1

## 2016-11-30 MED ORDER — ONDANSETRON HCL 4 MG/2ML IJ SOLN
4.0000 mg | Freq: Once | INTRAMUSCULAR | Status: AC
  Administered 2016-11-30: 4 mg via INTRAVENOUS
  Filled 2016-11-30: qty 2

## 2016-11-30 MED ORDER — ONDANSETRON 4 MG PO TBDP: 4.0000 mg | ORAL_TABLET | Freq: Once | ORAL | Status: DC | PRN

## 2016-11-30 MED ORDER — SODIUM CHLORIDE 0.9 % IV BOLUS (SEPSIS)
1000.0000 mL | Freq: Once | INTRAVENOUS | Status: AC
  Administered 2016-11-30: 1000 mL via INTRAVENOUS

## 2016-11-30 MED ORDER — DICYCLOMINE HCL 20 MG PO TABS: 20.0000 mg | ORAL_TABLET | Freq: Three times a day (TID) | ORAL | 0 refills | Status: DC | PRN

## 2016-11-30 MED ORDER — PANTOPRAZOLE SODIUM 40 MG IV SOLR
40.0000 mg | Freq: Once | INTRAVENOUS | Status: AC
  Administered 2016-11-30: 40 mg via INTRAVENOUS
  Filled 2016-11-30: qty 40

## 2016-11-30 NOTE — ED Notes (Signed)
ED Provider at bedside. 

## 2016-11-30 NOTE — ED Provider Notes (Signed)
I saw and evaluated the patient, reviewed the resident's note and I agree with the findings and plan.   EKG Interpretation None     31 year old male presents with several week history of intermittent nausea and vomiting with abdominal cramping. Has been diagnosed with overactive reduction in the stomach. States his last use of marijuana was 2 months ago. Abdominal exam is nonsurgical. We'll treat symptomatically and check blood work.   Lorre NickAllen, Rylend Pietrzak, MD 11/30/16 1910

## 2016-11-30 NOTE — ED Triage Notes (Signed)
Patient c/o vomiting and upper abdominal pain with dark stools x1 month. Denies diarrhea.

## 2016-11-30 NOTE — ED Provider Notes (Signed)
[facility]-EMERGENCY DEPT Provider Note   CSN: [id number] Arrival date & time: 11/30/16  1745     History   Chief Complaint Chief Complaint  Patient presents with  . Emesis  . Abdominal Pain    HPI Erice Ahles is a 31 y.o. male.  Pt is a 30 yo male presenting with nausea and vomiting that began the last week or two and progressively got worse.  Last ate this morning eggs and waffles shortly after vomited.  He reports not taking any of his outpatient medications and says he hasn't for months.  He states two months ago he came to the ED for similar symptoms.  At the time he says he was smoking "a lot of marijuana" but since the last ED visit says he has stopped completely.  He denies any other recreational drug use.  His last vomiting episode before coming to the ED he noticed some blood tinged sputum but denies gross blood.  He reports his stools have been black for the last few weeks and the last week have been loose.  Additionally, he endorses getting dizzy when standing up and generalized weakness.      Emesis   Associated symptoms include abdominal pain and diarrhea. Pertinent negatives include no arthralgias, no chills, no cough and no fever.  Abdominal Pain   Associated symptoms include diarrhea, nausea and vomiting. Pertinent negatives include fever, dysuria, hematuria and arthralgias.    Past Medical History:  Diagnosis Date  . Bipolar 1 disorder (HCC)   . Bipolar disorder (HCC)   . Cyclic vomiting syndrome   . GERD (gastroesophageal reflux disease)   . Nephrolithiasis   . Schizophrenia, acute     Patient Active Problem List   Diagnosis Date Noted  . Cannabis use disorder, severe, dependence (HCC) 03/19/2015  . Bipolar 1 disorder (HCC) 03/19/2015  . Heart murmur 05/23/2012  . Dehydration, mild 05/23/2012  . Hyponatremia 06/04/2011  . Hypokalemia 06/04/2011  . Leukocytosis 06/04/2011  . GERD (gastroesophageal reflux disease)   . Nephrolithiasis   . Cyclic  vomiting syndrome   . Abdominal pain, epigastric 06/10/2010    Past Surgical History:  Procedure Laterality Date  . APPENDECTOMY    . unremarkable         Home Medications    Prior to Admission medications   Medication Sig Start Date End Date Taking? Authorizing Provider  chlorproMAZINE (THORAZINE) 50 MG tablet Take 1 tablet (50 mg total) by mouth 3 (three) times daily as needed for hiccoughs. Patient not taking: Reported on 09/03/2016 12/30/15   Elpidio Anis, PA-C  dicyclomine (BENTYL) 20 MG tablet Take 1 tablet (20 mg total) by mouth 2 (two) times daily. Patient not taking: Reported on 12/29/2015 04/25/15   Sam, Ace Gins, PA-C  esomeprazole (NEXIUM) 40 MG capsule Take 1 capsule (40 mg total) by mouth daily. Patient not taking: Reported on 12/29/2015 04/22/15   Azalia Bilis, MD  metoCLOPramide (REGLAN) 10 MG tablet Take 1 tablet (10 mg total) by mouth every 6 (six) hours as needed for nausea or vomiting. Patient not taking: Reported on 09/03/2016 06/07/16   Doug Sou, MD  omeprazole (PRILOSEC) 20 MG capsule Take 1 capsule (20 mg total) by mouth daily. Patient not taking: Reported on 12/29/2015 06/04/15   Arby Barrette, MD  ondansetron (ZOFRAN ODT) 4 MG disintegrating tablet Take 1 tablet (4 mg total) by mouth every 8 (eight) hours as needed for nausea or vomiting. 09/03/16   Ward, Chase Picket, PA-C  promethazine (PHENERGAN) 25 MG  tablet Take 1 tablet (25 mg total) by mouth every 8 (eight) hours as needed for nausea or vomiting. Patient not taking: Reported on 04/25/2015 04/22/15   Azalia Bilisampos, Kevin, MD  promethazine (PHENERGAN) 25 MG tablet Take 1 tablet (25 mg total) by mouth every 8 (eight) hours as needed for nausea or vomiting. 09/13/16   Raeford RazorKohut, Stephen, MD  risperiDONE (RISPERDAL) 1 MG tablet Take 1 tablet (1 mg total) by mouth at bedtime. Patient not taking: Reported on 12/29/2015 03/20/15   Rankin, Shuvon B, NP  sucralfate (CARAFATE) 1 G tablet Take 1 tablet (1 g total) by mouth 4  (four) times daily -  with meals and at bedtime. Patient not taking: Reported on 06/04/2015 04/22/15   Azalia Bilisampos, Kevin, MD  sucralfate (CARAFATE) 1 GM/10ML suspension Take 10 mLs (1 g total) by mouth 4 (four) times daily -  with meals and at bedtime. Patient not taking: Reported on 12/29/2015 06/04/15   Arby BarrettePfeiffer, Marcy, MD  traZODone (DESYREL) 50 MG tablet Take 1 tablet (50 mg total) by mouth at bedtime as needed for sleep. Patient not taking: Reported on 12/29/2015 03/20/15   Rankin, Denice BorsShuvon B, NP    Family History Family History  Problem Relation Age of Onset  . Liver cancer Mother   . Colon cancer Maternal Uncle   . Diabetes Maternal Aunt   . Heart disease Paternal Grandfather   . Testicular cancer Brother     Social History Social History  Substance Use Topics  . Smoking status: Current Every Day Smoker    Years: 1.00    Types: Cigarettes    Last attempt to quit: 06/04/2007  . Smokeless tobacco: Never Used     Comment: see illicit drug comment  . Alcohol use Yes     Comment: occasional-once per month     Allergies   Gluten meal; Milk-related compounds; and Latex   Review of Systems Review of Systems  Constitutional: Positive for appetite change. Negative for chills and fever.  HENT: Negative for ear pain and sore throat.   Eyes: Negative for pain and visual disturbance.  Respiratory: Negative for cough and shortness of breath.   Cardiovascular: Negative for chest pain and palpitations.  Gastrointestinal: Positive for abdominal pain, diarrhea, nausea and vomiting. Negative for abdominal distention.  Genitourinary: Negative for dysuria and hematuria.  Musculoskeletal: Negative for arthralgias and back pain.  Skin: Negative for color change and rash.  Neurological: Negative for seizures and syncope.  Psychiatric/Behavioral: Negative for confusion. The patient is nervous/anxious.   All other systems reviewed and are negative.    Physical Exam Updated Vital Signs BP (!)  147/101 (BP Location: Right Arm)   Pulse 88   Temp 98 F (36.7 C) (Oral)   Resp 16   Ht 6\' 2"  (1.88 m)   Wt 63.5 kg (140 lb)   SpO2 100%   BMI 17.97 kg/m   Physical Exam  HENT:  Head: Normocephalic and atraumatic.  Neck: No tracheal deviation present. No thyromegaly present.  Cardiovascular: Normal rate, regular rhythm and normal heart sounds.  Exam reveals no gallop and no friction rub.   No murmur heard. Pulmonary/Chest: Effort normal and breath sounds normal. He has no wheezes. He has no rales.  Abdominal: Soft. He exhibits no distension. There is tenderness.  Musculoskeletal: He exhibits no edema or deformity.  Skin: Skin is warm and dry.  Psychiatric: His mood appears anxious. He is hyperactive.     ED Treatments / Results  Labs (all labs ordered are  listed, but only abnormal results are displayed) Labs Reviewed  LIPASE, BLOOD  COMPREHENSIVE METABOLIC PANEL  CBC  URINALYSIS, ROUTINE W REFLEX MICROSCOPIC    EKG  EKG Interpretation None       Radiology No results found.  Procedures Procedures (including critical care time)  Medications Ordered in ED Medications  sodium chloride 0.9 % bolus 1,000 mL (not administered)  ondansetron (ZOFRAN) injection 4 mg (not administered)  HYDROmorphone (DILAUDID) injection 1 mg (not administered)  LORazepam (ATIVAN) injection 1 mg (not administered)     Initial Impression / Assessment and Plan / ED Course  I have reviewed the triage vital signs and the nursing notes.  Pertinent labs & imaging results that were available during my care of the patient were reviewed by me and considered in my medical decision making (see chart for details).     Emesis/Nausea/Abd pain  -IV fluids -Hydromorphone 1mg  -Ativan 1mg  -protonix 40mg  -CBC, lipase, CMP, Urinalysis  DDX: likely recurrence of cyclic vomiting syndrome -passed PO trial, pt feeling much better after medications administered and agreeable to go  home    Final Clinical Impressions(s) / ED Diagnoses   Final diagnoses:  None    New Prescriptions New Prescriptions   No medications on file     Angelita Ingles, MD 11/30/16 2143

## 2016-11-30 NOTE — ED Notes (Signed)
Pt is aware urine sample is needed. Pt states he is unable to leave urine sample at this time.

## 2016-11-30 NOTE — ED Notes (Signed)
Bolus complete  Pt given a urinal and told if he could not urinate he would be catheterized for the urine specimen

## 2016-11-30 NOTE — ED Notes (Signed)
Pt states he is unable to urinate at this time  States he will try again after the second bag of fluid
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
## 2017-08-12 ENCOUNTER — Encounter (HOSPITAL_COMMUNITY): Payer: Self-pay | Admitting: Emergency Medicine

## 2017-08-12 ENCOUNTER — Emergency Department (HOSPITAL_COMMUNITY)
Admission: EM | Admit: 2017-08-12 | Discharge: 2017-08-12 | Disposition: A | Discharge status: Home / Self Care | Payer: Commercial Managed Care - PPO | Attending: Emergency Medicine | Admitting: Emergency Medicine

## 2017-08-12 ENCOUNTER — Other Ambulatory Visit: Payer: Self-pay

## 2017-08-12 DIAGNOSIS — Z5321 Procedure and treatment not carried out due to patient leaving prior to being seen by health care provider: Secondary | ICD-10-CM | POA: Insufficient documentation

## 2017-08-12 DIAGNOSIS — R109 Unspecified abdominal pain: Secondary | ICD-10-CM | POA: Diagnosis present

## 2017-08-12 DIAGNOSIS — R1110 Vomiting, unspecified: Secondary | ICD-10-CM | POA: Diagnosis not present

## 2017-08-12 MED ORDER — SODIUM CHLORIDE 0.9 % IV BOLUS (SEPSIS): 1000.0000 mL | Freq: Once | INTRAVENOUS | Status: DC

## 2017-08-12 MED ORDER — ONDANSETRON HCL 4 MG/2ML IJ SOLN: 4.0000 mg | Freq: Once | INTRAMUSCULAR | Status: DC

## 2017-08-12 NOTE — ED Notes (Signed)
Pt called  No response from lobby  

## 2017-08-12 NOTE — ED Triage Notes (Signed)
Pt called to take to treatment room  No response from lobby 

## 2017-08-12 NOTE — ED Triage Notes (Signed)
Pt is c/o abd pain with vomiting  States he has not been able to keep anything down for the past week or so  Pt has hx of ulcers  Pt has hiccups

## 2017-10-15 ENCOUNTER — Encounter (HOSPITAL_COMMUNITY): Payer: Self-pay

## 2017-10-15 ENCOUNTER — Emergency Department (HOSPITAL_COMMUNITY): Payer: Commercial Managed Care - PPO

## 2017-10-15 ENCOUNTER — Other Ambulatory Visit: Payer: Self-pay

## 2017-10-15 ENCOUNTER — Emergency Department (HOSPITAL_COMMUNITY)
Admission: EM | Admit: 2017-10-15 | Discharge: 2017-10-15 | Disposition: A | Discharge status: Home / Self Care | Payer: Commercial Managed Care - PPO | Attending: Emergency Medicine | Admitting: Emergency Medicine

## 2017-10-15 DIAGNOSIS — Z91040 Latex allergy status: Secondary | ICD-10-CM | POA: Insufficient documentation

## 2017-10-15 DIAGNOSIS — R1084 Generalized abdominal pain: Secondary | ICD-10-CM | POA: Diagnosis not present

## 2017-10-15 DIAGNOSIS — F17210 Nicotine dependence, cigarettes, uncomplicated: Secondary | ICD-10-CM | POA: Diagnosis not present

## 2017-10-15 DIAGNOSIS — Z79899 Other long term (current) drug therapy: Secondary | ICD-10-CM | POA: Insufficient documentation

## 2017-10-15 DIAGNOSIS — R112 Nausea with vomiting, unspecified: Secondary | ICD-10-CM | POA: Diagnosis not present

## 2017-10-15 LAB — CBC
HCT: 33.5 % — ABNORMAL LOW (ref 39.0–52.0)
Hemoglobin: 11.6 g/dL — ABNORMAL LOW (ref 13.0–17.0)
MCH: 34.5 pg — AB (ref 26.0–34.0)
MCHC: 34.6 g/dL (ref 30.0–36.0)
MCV: 99.7 fL (ref 78.0–100.0)
PLATELETS: 145 10*3/uL — AB (ref 150–400)
RBC: 3.36 MIL/uL — AB (ref 4.22–5.81)
RDW: 11.9 % (ref 11.5–15.5)
WBC: 8.7 10*3/uL (ref 4.0–10.5)

## 2017-10-15 LAB — COMPREHENSIVE METABOLIC PANEL
ALT: 17 U/L (ref 17–63)
AST: 23 U/L (ref 15–41)
Albumin: 4.1 g/dL (ref 3.5–5.0)
Alkaline Phosphatase: 62 U/L (ref 38–126)
Anion gap: 9 (ref 5–15)
BUN: 14 mg/dL (ref 6–20)
CALCIUM: 9.8 mg/dL (ref 8.9–10.3)
CHLORIDE: 98 mmol/L — AB (ref 101–111)
CO2: 30 mmol/L (ref 22–32)
CREATININE: 1 mg/dL (ref 0.61–1.24)
GFR calc Af Amer: >60 mL/min (ref >60)
GFR calc non Af Amer: >60 mL/min (ref >60)
Glucose, Bld: 117 mg/dL — ABNORMAL HIGH (ref 65–99)
POTASSIUM: 3.9 mmol/L (ref 3.5–5.1)
SODIUM: 137 mmol/L (ref 135–145)
Total Bilirubin: 0.3 mg/dL (ref 0.3–1.2)
Total Protein: 7.4 g/dL (ref 6.5–8.1)

## 2017-10-15 LAB — LIPASE, BLOOD: Lipase: 25 U/L (ref 11–51)

## 2017-10-15 MED ORDER — SODIUM CHLORIDE 0.9 % IV BOLUS
1000.0000 mL | Freq: Once | INTRAVENOUS | Status: AC
  Administered 2017-10-15: 1000 mL via INTRAVENOUS

## 2017-10-15 MED ORDER — HYDROCODONE-ACETAMINOPHEN 5-325 MG PO TABS
1.0000 | ORAL_TABLET | Freq: Once | ORAL | Status: AC
  Administered 2017-10-15: 1 via ORAL
  Filled 2017-10-15: qty 1

## 2017-10-15 MED ORDER — FAMOTIDINE 20 MG PO TABS: 20.0000 mg | ORAL_TABLET | Freq: Two times a day (BID) | ORAL | 0 refills | Status: DC

## 2017-10-15 MED ORDER — FAMOTIDINE IN NACL 20-0.9 MG/50ML-% IV SOLN
20.0000 mg | Freq: Once | INTRAVENOUS | Status: AC
  Administered 2017-10-15: 20 mg via INTRAVENOUS
  Filled 2017-10-15: qty 50

## 2017-10-15 MED ORDER — ONDANSETRON 4 MG PO TBDP: ORAL_TABLET | ORAL | 0 refills | Status: DC

## 2017-10-15 MED ORDER — GI COCKTAIL ~~LOC~~
30.0000 mL | Freq: Once | ORAL | Status: AC
Start: 2017-10-15 — End: 2017-10-15
  Administered 2017-10-15: 30 mL via ORAL
  Filled 2017-10-15: qty 30

## 2017-10-15 MED ORDER — ONDANSETRON HCL 4 MG/2ML IJ SOLN
4.0000 mg | Freq: Once | INTRAMUSCULAR | Status: AC
  Administered 2017-10-15: 4 mg via INTRAVENOUS
  Filled 2017-10-15: qty 2

## 2017-10-15 MED ORDER — SUCRALFATE 1 GM/10ML PO SUSP: 1.0000 g | Freq: Three times a day (TID) | ORAL | 0 refills | Status: DC

## 2017-10-15 MED ORDER — LORAZEPAM 2 MG/ML IJ SOLN
0.5000 mg | Freq: Once | INTRAMUSCULAR | Status: AC
  Administered 2017-10-15: 0.5 mg via INTRAVENOUS
  Filled 2017-10-15: qty 1

## 2017-10-15 NOTE — ED Triage Notes (Signed)
He reports upper abd. Discomfort and frequent vomiting x 2 weeks. He is in no distress.

## 2017-10-15 NOTE — ED Notes (Addendum)
No respiratory or acute distress noted alert and oriented x 3 call light in reach no reaction to medication noted. 

## 2017-10-15 NOTE — ED Provider Notes (Signed)
[facility]-EMERGENCY DEPT Provider Note   CSN: [id number] Arrival date & time: 10/15/17  1402     History   Chief Complaint Chief Complaint  Patient presents with  . Abdominal Pain    HPI Erik Cowan is a 32 y.o. male.  Erik Cowan is a 32 y.o. Male with history of bipolar disorder, GERD, schizophrenia and cyclic vomiting syndrome, presents to the emergency department for evaluation of generalized abdominal pain, nausea and vomiting.  He reports this been going on intermittently for the last few weeks.  He reports over the past few days his abdominal pain has been more severe, and localized in the epigastric region, he describes pain as a burning and constant discomfort.  He reports he is vomited numerous times over the past few days and every time he eats he vomits.  He denies any blood in the vomit, he denies any blood in the stool or dark stools.  Occasional diarrhea.  He denies any dysuria, frequency or hematuria.  He denies any fevers or chills, no chest pain or shortness of breath.  Patient reports he is continue to use marijuana, he has been seen numerous times for similar symptoms in the past.     Past Medical History:  Diagnosis Date  . Bipolar 1 disorder (HCC)   . Bipolar disorder (HCC)   . Cyclic vomiting syndrome   . GERD (gastroesophageal reflux disease)   . Nephrolithiasis   . Schizophrenia, acute [facility])     Patient Active Problem List   Diagnosis Date Noted  . Cannabis use disorder, severe, dependence (HCC) 03/19/2015  . Bipolar 1 disorder (HCC) 03/19/2015  . Heart murmur 05/23/2012  . Dehydration, mild 05/23/2012  . Hyponatremia 06/04/2011  . Hypokalemia 06/04/2011  . Leukocytosis 06/04/2011  . GERD (gastroesophageal reflux disease)   . Nephrolithiasis   . Cyclic vomiting syndrome   . Abdominal pain, epigastric 06/10/2010    Past Surgical History:  Procedure Laterality Date  . APPENDECTOMY    . unremarkable           Home Medications    Prior to Admission medications   Medication Sig Start Date End Date Taking? Authorizing Provider  capsaicin (ZOSTRIX) 0.025 % cream Apply 1 application topically 2 (two) times daily.    [provider]  chlorproMAZINE (THORAZINE) 50 MG tablet Take 1 tablet (50 mg total) by mouth 3 (three) times daily as needed for hiccoughs. Patient not taking: Reported on 09/03/2016 12/30/15   Elpidio Anis, PA-C  dicyclomine (BENTYL) 20 MG tablet Take 1 tablet (20 mg total) by mouth 3 (three) times daily as needed for spasms. 11/30/16 11/30/17  Angelita Ingles, MD  esomeprazole (NEXIUM) 40 MG capsule Take 1 capsule (40 mg total) by mouth daily. Patient not taking: Reported on 12/29/2015 04/22/15   Azalia Bilis, MD  famotidine (PEPCID) 20 MG tablet Take 1 tablet (20 mg total) by mouth 2 (two) times daily. 10/15/17   Dartha Lodge, PA-C  ibuprofen (ADVIL,MOTRIN) 200 MG tablet Take 800 mg by mouth every 6 (six) hours as needed (pain).    [provider]  metoCLOPramide (REGLAN) 10 MG tablet Take 1 tablet (10 mg total) by mouth every 6 (six) hours as needed for nausea or vomiting. Patient not taking: Reported on 09/03/2016 06/07/16   Doug Sou, MD  omeprazole (PRILOSEC) 20 MG capsule Take 1 capsule (20 mg total) by mouth daily. Patient not taking: Reported on 12/29/2015 06/04/15   Arby Barrette, MD  ondansetron [facility]  ODT) 4 MG disintegrating tablet  ODT q4 hours prn nausea/vomit 10/15/17   Dartha Lodge, PA-C  promethazine (PHENERGAN) 25 MG tablet Take 1 tablet (25 mg total) by mouth every 8 (eight) hours as needed for nausea or vomiting. Patient not taking: Reported on 04/25/2015 04/22/15   Azalia Bilis, MD  promethazine (PHENERGAN) 25 MG tablet Take 1 tablet (25 mg total) by mouth every 8 (eight) hours as needed for nausea or vomiting. Patient not taking: Reported on 11/30/2016 09/13/16   Raeford Razor, MD  risperiDONE (RISPERDAL) 1 MG tablet Take 1 tablet (1  mg total) by mouth at bedtime. Patient not taking: Reported on 12/29/2015 03/20/15   Rankin, Shuvon B, NP  sucralfate (CARAFATE) 1 GM/10ML suspension Take 10 mLs (1 g total) by mouth 4 (four) times daily -  with meals and at bedtime. 10/15/17   Dartha Lodge, PA-C  traZODone (DESYREL) 50 MG tablet Take 1 tablet (50 mg total) by mouth at bedtime as needed for sleep. Patient not taking: Reported on 12/29/2015 03/20/15   Rankin, Denice Bors B, NP    Family History Family History  Problem Relation Age of Onset  . Liver cancer Mother   . Colon cancer Maternal Uncle   . Diabetes Maternal Aunt   . Heart disease Paternal Grandfather   . Testicular cancer Brother     Social History Social History   Tobacco Use  . Smoking status: Current Every Day Smoker    Years: 1.00    Types: Cigarettes    Last attempt to quit: 06/04/2007    Years since quitting: 10.3  . Smokeless tobacco: Never Used  . Tobacco comment: see illicit drug comment  Substance Use Topics  . Alcohol use: Yes    Comment: occasional-once per month  . Drug use: Yes    Frequency: 7.0 times per week    Types: Marijuana    Comment: smokes marijuana daily     Allergies   Gluten meal; Milk-related compounds; and Latex   Review of Systems Review of Systems  Constitutional: Negative for chills and fever.  HENT: Negative for congestion, rhinorrhea and sore throat.   Eyes: Negative for visual disturbance.  Respiratory: Negative for cough and shortness of breath.   Cardiovascular: Negative for chest pain.  Gastrointestinal: Positive for abdominal pain, nausea and vomiting. Negative for blood in stool and diarrhea.  Genitourinary: Negative for dysuria, frequency and hematuria.  Musculoskeletal: Negative for arthralgias and myalgias.  Skin: Negative for color change and rash.  Neurological: Negative for dizziness, syncope and light-headedness.     Physical Exam Updated Vital Signs BP 132/70   Pulse 69   Temp (!) 97.4 F (36.3  C) (Oral)   Resp 16   Ht  (1.88 m)   Wt 65.8 kg (145 lb)   SpO2 96%   BMI 18.62 kg/m   Physical Exam  Constitutional: He appears well-developed and well-nourished.  Patient writhing on stretcher upon my entering the room, but was previously laying comfortably.  HENT:  Head: Normocephalic and atraumatic.  Mouth/Throat: Oropharynx is clear and moist.  Eyes: Right eye exhibits no discharge. Left eye exhibits no discharge.  Cardiovascular: Normal rate, regular rhythm, normal heart sounds and intact distal pulses.  Pulmonary/Chest: Effort normal and breath sounds normal. No stridor. No respiratory distress. He has no wheezes. He has no rales.  Respirations equal and unlabored, patient able to speak in full sentences, lungs clear to auscultation bilaterally  Abdominal: Normal appearance and bowel sounds are normal.  He exhibits no distension, no pulsatile midline mass and no mass. There is generalized tenderness. There is no rigidity, no rebound, no guarding and no CVA tenderness.  Abdomen soft, nondistended, bowel sounds present throughout, there is mild generalized tenderness, most apparent in the epigastrium, no guarding, no peritoneal signs, no CVA tenderness.  Neurological: He is alert. Coordination normal.  Skin: Skin is warm and dry. Capillary refill takes less than 2 seconds. He is not diaphoretic.  Psychiatric: He has a normal mood and affect. His behavior is normal.  Nursing note and vitals reviewed.    ED Treatments / Results  Labs (all labs ordered are listed, but only abnormal results are displayed) Labs Reviewed  COMPREHENSIVE METABOLIC PANEL - Abnormal; Notable for the following components:      Result Value   Chloride 98 (*)    Glucose, Bld 117 (*)    All other components within normal limits  CBC - Abnormal; Notable for the following components:   RBC 3.36 (*)    Hemoglobin 11.6 (*)    HCT 33.5 (*)    MCH 34.5 (*)    Platelets 145 (*)    All other components  within normal limits  LIPASE, BLOOD    EKG None  Radiology Dg Abdomen Acute W/chest  Result Date: 10/15/2017 CLINICAL DATA:  Upper abdominal discomfort with frequent vomiting x2 weeks. Smoker. EXAM: DG ABDOMEN ACUTE W/ 1V CHEST COMPARISON:  CXR 12/30/2015, CT AP 04/04/2015 FINDINGS: Heart size and mediastinal contours are stable. Lungs are mildly hyperinflated without pulmonary consolidation, effusion or edema. No pneumothorax. Nonspecific bowel gas pattern with a few scattered air containing small bowel loops in the left hemiabdomen. Average amount of stool retention within the right colon. No bowel obstruction or free air. IMPRESSION: A few gas containing small bowel loops are noted in the left hemiabdomen, in a nonspecific bowel gas pattern, possibly representing small bowel enteritis. No bowel obstruction is seen. No acute cardiopulmonary disease. Electronically Signed   By: Tollie Eth M.D.   On: 10/15/2017 18:30    Procedures Procedures (including critical care time)  Medications Ordered in ED Medications  sodium chloride 0.9 % bolus 1,000 mL (0 mLs Intravenous Stopped 10/15/17 1917)  ondansetron (ZOFRAN) injection 4 mg (4 mg Intravenous Given 10/15/17 1816)  famotidine (PEPCID) IVPB 20 mg premix (0 mg Intravenous Stopped 10/15/17 1917)  LORazepam (ATIVAN) injection 0.5 mg (0.5 mg Intravenous Given 10/15/17 1816)  gi cocktail (Maalox,Lidocaine,Donnatal) (30 mLs Oral Given 10/15/17 1921)  HYDROcodone-acetaminophen (NORCO/VICODIN) 5-325 MG per tablet 1 tablet (1 tablet Oral Given 10/15/17 2019)     Initial Impression / Assessment and Plan / ED Course  I have reviewed the triage vital signs and the nursing notes.  Pertinent labs & imaging results that were available during my care of the patient were reviewed by me and considered in my medical decision making (see chart for details).  Patient presents for evaluation of abdominal pain, nausea and vomiting.  Patient has been seen in the ED  numerous times for cyclic vomiting syndrome, with similar presentation.  Vitals normal on arrival.  Patient initially laying comfortably but begins to rise and complain of intense pain upon my entering the room.  No active vomiting at this time.  Mild generalized tenderness, most pronounced in the epigastrium.  Will get abdominal labs and acute abdominal series to rule out any free air.  IV fluids, Zofran, 0.5 mg of Ativan.  And IV Pepcid.  Labs reassuring, no leukocytosis, hemoglobin is at  baseline, no acute electrolyte derangements, normal renal and liver function, normal lipase.  Patient has not provided urine sample, reports he urinated before arriving in the room and has not been able to go again.   No more vomiting since medications, patient continues to complain of abdominal pain will give GI cocktail, patient is now tolerating p.o.  I feel patient is stable for discharge home, he continues to complain of pain, will give 1 dose of p.o. pain medication, but discussed with patient that he will be discharged with symptomatic treatment with Zofran, Carafate and Pepcid and he will need to follow-up outpatient with primary care and GI for continued evaluation.  Return precautions discussed.  Patient expresses understanding and is in agreement with plan.  Final Clinical Impressions(s) / ED Diagnoses   Final diagnoses:  Generalized abdominal pain  Non-intractable vomiting with nausea, unspecified vomiting type    ED Discharge Orders        Ordered    famotidine (PEPCID) 20 MG tablet  2 times daily     10/15/17 2004    ondansetron (ZOFRAN ODT) 4 MG disintegrating tablet     10/15/17 2004    sucralfate (CARAFATE) 1 GM/10ML suspension  3 times daily with meals & bedtime     10/15/17 2004       Dartha Lodge, [place] 10/16/17 1610    Cathren Laine, MD 10/16/17 1547

## 2017-10-15 NOTE — ED Notes (Signed)
Pt is aware a urine sample is needed, but is unable to provide one at this time as he stated he had just voided his bladder prior to triage. Pt has specimen cup in lobby.

## 2017-10-15 NOTE — ED Notes (Signed)
Patient transported to X-ray 

## 2017-10-15 NOTE — Discharge Instructions (Addendum)
Your evaluation today is reassuring, please begin taking Pepcid twice daily at least 30 minutes before breakfast and dinner, you may use Carafate as needed for abdominal pain, and Zofran for nausea and vomiting.  Is extremely important that you follow-up with primary care.  I have also provided a referral to GI please call to schedule follow-up appointments with them.  To the emergency department for worsening or localized abdominal pain, persistent vomiting, fevers, blood in the vomit or stool or any other new or concerning symptoms.

## 2017-10-15 NOTE — ED Notes (Signed)
Pt pacing around bed states pain in abdomen is too much and he can't leave in this much pain.

## 2020-04-14 ENCOUNTER — Emergency Department (HOSPITAL_COMMUNITY)
Admission: EM | Admit: 2020-04-14 | Discharge: 2020-04-14 | Disposition: A | Discharge status: Home / Self Care | Payer: Commercial Managed Care - PPO | Attending: Emergency Medicine | Admitting: Emergency Medicine

## 2020-04-14 ENCOUNTER — Other Ambulatory Visit: Payer: Self-pay

## 2020-04-14 DIAGNOSIS — Z91040 Latex allergy status: Secondary | ICD-10-CM | POA: Insufficient documentation

## 2020-04-14 DIAGNOSIS — K0889 Other specified disorders of teeth and supporting structures: Secondary | ICD-10-CM | POA: Insufficient documentation

## 2020-04-14 DIAGNOSIS — F17210 Nicotine dependence, cigarettes, uncomplicated: Secondary | ICD-10-CM | POA: Insufficient documentation

## 2020-04-14 MED ORDER — PENICILLIN V POTASSIUM 500 MG PO TABS: 500.0000 mg | ORAL_TABLET | Freq: Four times a day (QID) | ORAL | 0 refills | Status: DC

## 2020-04-14 MED ORDER — KETOROLAC TROMETHAMINE 30 MG/ML IJ SOLN
30.0000 mg | Freq: Once | INTRAMUSCULAR | Status: AC
  Administered 2020-04-14: 30 mg via INTRAMUSCULAR
  Filled 2020-04-14: qty 1

## 2020-04-14 MED ORDER — PENICILLIN V POTASSIUM 250 MG PO TABS
500.0000 mg | ORAL_TABLET | Freq: Once | ORAL | Status: AC
  Administered 2020-04-14: 500 mg via ORAL
  Filled 2020-04-14: qty 2

## 2020-04-14 NOTE — ED Triage Notes (Signed)
PT said he has been having left lower teeth pain x 2 days. Pt said hard to chew and swollen

## 2020-04-14 NOTE — ED Notes (Signed)
Pt has had dental pain X3 days.  Ambulated back to room w/o any issues.

## 2020-04-14 NOTE — ED Provider Notes (Signed)
[facility] EMERGENCY DEPARTMENT Provider Note   CSN: [id number] Arrival date & time: 04/14/20  0304     History   Chief Complaint Chief Complaint  Patient presents with  . Dental Pain    HPI Erik Cowan is a 34 y.o. male.  Patient presents to the emergency department with a dental complaint. Symptoms began 2 days ago. The patient has tried to alleviate pain with OTC Tylenol and Ibuprofen.  Pain rated as moderate to severe, characterized as throbbing in nature and located left lower molars. Patient denies fever, night sweats, chills, difficulty swallowing or opening mouth, SOB, nuchal rigidity or decreased ROM of neck.  Patient does not have a dentist and requests a resource guide at discharge.      HPI  Past Medical History:  Diagnosis Date  . Bipolar 1 disorder (HCC)   . Bipolar disorder (HCC)   . Cyclic vomiting syndrome   . GERD (gastroesophageal reflux disease)   . Nephrolithiasis   . Schizophrenia, acute [facility])     Patient Active Problem List   Diagnosis Date Noted  . Cannabis use disorder, severe, dependence (HCC) 03/19/2015  . Bipolar 1 disorder (HCC) 03/19/2015  . Heart murmur 05/23/2012  . Dehydration, mild 05/23/2012  . Hyponatremia 06/04/2011  . Hypokalemia 06/04/2011  . Leukocytosis 06/04/2011  . GERD (gastroesophageal reflux disease)   . Nephrolithiasis   . Cyclic vomiting syndrome   . Abdominal pain, epigastric 06/10/2010    Past Surgical History:  Procedure Laterality Date  . APPENDECTOMY    . unremarkable          Home Medications    Prior to Admission medications   Medication Sig Start Date End Date Taking? Authorizing Provider  capsaicin (ZOSTRIX) 0.025 % cream Apply 1 application topically 2 (two) times daily.    [provider]  chlorproMAZINE (THORAZINE) 50 MG tablet Take 1 tablet (50 mg total) by mouth 3 (three) times daily as needed for hiccoughs. Patient not taking: Reported on 09/03/2016 12/30/15    Elpidio Anis, PA-C  dicyclomine (BENTYL) 20 MG tablet Take 1 tablet (20 mg total) by mouth 3 (three) times daily as needed for spasms. 11/30/16 11/30/17  Angelita Ingles, MD  esomeprazole (NEXIUM) 40 MG capsule Take 1 capsule (40 mg total) by mouth daily. Patient not taking: Reported on 12/29/2015 04/22/15   Azalia Bilis, MD  famotidine (PEPCID) 20 MG tablet Take 1 tablet (20 mg total) by mouth 2 (two) times daily. 10/15/17   Dartha Lodge, PA-C  ibuprofen (ADVIL,MOTRIN) 200 MG tablet Take 800 mg by mouth every 6 (six) hours as needed (pain).    [provider]  metoCLOPramide (REGLAN) 10 MG tablet Take 1 tablet (10 mg total) by mouth every 6 (six) hours as needed for nausea or vomiting. Patient not taking: Reported on 09/03/2016 06/07/16   Doug Sou, MD  omeprazole (PRILOSEC) 20 MG capsule Take 1 capsule (20 mg total) by mouth daily. Patient not taking: Reported on 12/29/2015 06/04/15   Arby Barrette, MD  ondansetron [facility] ODT) 4 MG disintegrating tablet 4mg  ODT q4 hours prn nausea/vomit 10/15/17   10/17/17, PA-C  promethazine (PHENERGAN) 25 MG tablet Take 1 tablet (25 mg total) by mouth every 8 (eight) hours as needed for nausea or vomiting. Patient not taking: Reported on 04/25/2015 04/22/15   04/24/15, MD  promethazine (PHENERGAN) 25 MG tablet Take 1 tablet (25 mg total) by mouth every 8 (eight) hours as needed for nausea or vomiting.  Patient not taking: Reported on 11/30/2016 09/13/16   Raeford Razor, MD  risperiDONE (RISPERDAL) 1 MG tablet Take 1 tablet (1 mg total) by mouth at bedtime. Patient not taking: Reported on 12/29/2015 03/20/15   Rankin, Shuvon B, NP  sucralfate (CARAFATE) 1 GM/10ML suspension Take 10 mLs (1 g total) by mouth 4 (four) times daily -  with meals and at bedtime. 10/15/17   Dartha Lodge, PA-C  traZODone (DESYREL) 50 MG tablet Take 1 tablet (50 mg total) by mouth at bedtime as needed for sleep. Patient not taking: Reported on 12/29/2015 03/20/15    Rankin, Denice Bors B, NP    Family History Family History  Problem Relation Age of Onset  . Liver cancer Mother   . Colon cancer Maternal Uncle   . Diabetes Maternal Aunt   . Heart disease Paternal Grandfather   . Testicular cancer Brother     Social History Social History   Tobacco Use  . Smoking status: Current Every Day Smoker    Years: 1.00    Types: Cigarettes    Last attempt to quit: 06/04/2007    Years since quitting: 12.8  . Smokeless tobacco: Never Used  . Tobacco comment: see illicit drug comment  Substance Use Topics  . Alcohol use: Yes    Comment: occasional-once per month  . Drug use: Yes    Frequency: 7.0 times per week    Types: Marijuana    Comment: smokes marijuana daily     Allergies   Gluten meal, Milk-related compounds, and Latex   Review of Systems Review of Systems  Constitutional: Negative for chills and fever.  HENT: Positive for dental problem. Negative for drooling.   Neurological: Negative for speech difficulty.  Psychiatric/Behavioral: Positive for sleep disturbance.     Physical Exam Updated Vital Signs BP (!) 156/97 (BP Location: Left Arm)   Pulse 60   Temp 98 F (36.7 C) (Oral)   Resp 14   Ht 6\' 2"  (1.88 m)   Wt 74.8 kg   SpO2 98%   BMI 21.18 kg/m   Physical Exam Physical Exam  Constitutional: Pt appears well-developed and well-nourished.  HENT:  Head: Normocephalic.  Right Ear: Tympanic membrane, external ear and ear canal normal.  Left Ear: Tympanic membrane, external ear and ear canal normal.  Nose: Nose normal. Right sinus exhibits no maxillary sinus tenderness and no frontal sinus tenderness. Left sinus exhibits no maxillary sinus tenderness and no frontal sinus tenderness.  Mouth/Throat: Uvula is midline, oropharynx is clear and moist and mucous membranes are normal. No oral lesions. No uvula swelling or lacerations. No oropharyngeal exudate, posterior oropharyngeal edema, posterior oropharyngeal erythema or tonsillar  abscesses.  Poor dentition No gingival swelling, fluctuance or induration No gross abscess  No sublingual edema, tenderness to palpation, or sign of Ludwig's angina, or deep space infection Pain at left lower wisdom tooth Eyes: Conjunctivae are normal. Pupils are equal, round, and reactive to light. Right eye exhibits no discharge. Left eye exhibits no discharge.  Neck: Normal range of motion. Neck supple.  No stridor Handling secretions without difficulty No nuchal rigidity No cervical lymphadenopathy Cardiovascular: Normal rate, regular rhythm and normal heart sounds.   Pulmonary/Chest: Effort normal. No respiratory distress.  Equal chest rise  Abdominal: Soft. Bowel sounds are normal. Pt exhibits no distension. There is no tenderness.  Lymphadenopathy: Pt has no cervical adenopathy.  Neurological: Pt is alert and oriented x 4  Skin: Skin is warm and dry.  Psychiatric: Pt has a normal  mood and affect.  Nursing note and vitals reviewed.   ED Treatments / Results  Labs (all labs ordered are listed, but only abnormal results are displayed) Labs Reviewed - No data to display  EKG    Radiology No results found.  Procedures Procedures (including critical care time)  Medications Ordered in ED Medications  penicillin v potassium (VEETID) tablet 500 mg (has no administration in time range)  ketorolac (TORADOL) 30 MG/ML injection 30 mg (has no administration in time range)     Initial Impression / Assessment and Plan / ED Course  I have reviewed the triage vital signs and the nursing notes.  Pertinent labs & imaging results that were available during my care of the patient were reviewed by me and considered in my medical decision making (see chart for details).        Patient with dentalgia.  No abscess requiring immediate incision and drainage.  Exam not concerning for Ludwig's angina or pharyngeal abscess.  Will treat with penicillin. Pt instructed to follow-up with  dentist.  Discussed return precautions. Pt safe for discharge.   Final Clinical Impressions(s) / ED Diagnoses   Final diagnoses:  Pain, dental    ED Discharge Orders         Ordered    penicillin v potassium (VEETID) 500 MG tablet  4 times daily        04/14/20 0439           Roxy Horseman, PA-C 04/14/20 0439    Dione Booze, MD 04/14/20 [id number]

## 2020-04-15 ENCOUNTER — Emergency Department (HOSPITAL_COMMUNITY)
Admission: EM | Admit: 2020-04-15 | Discharge: 2020-04-15 | Disposition: A | Discharge status: Home / Self Care | Payer: Self-pay | Attending: Emergency Medicine | Admitting: Emergency Medicine

## 2020-04-15 ENCOUNTER — Other Ambulatory Visit: Payer: Self-pay

## 2020-04-15 DIAGNOSIS — K0889 Other specified disorders of teeth and supporting structures: Secondary | ICD-10-CM | POA: Insufficient documentation

## 2020-04-15 DIAGNOSIS — F17210 Nicotine dependence, cigarettes, uncomplicated: Secondary | ICD-10-CM | POA: Insufficient documentation

## 2020-04-15 DIAGNOSIS — Z91040 Latex allergy status: Secondary | ICD-10-CM | POA: Insufficient documentation

## 2020-04-15 MED ORDER — KETOROLAC TROMETHAMINE 30 MG/ML IJ SOLN: 15.0000 mg | Freq: Once | INTRAMUSCULAR | Status: DC

## 2020-04-15 MED ORDER — KETOROLAC TROMETHAMINE 30 MG/ML IJ SOLN
15.0000 mg | Freq: Once | INTRAMUSCULAR | Status: AC
  Administered 2020-04-15: 15 mg via INTRAMUSCULAR
  Filled 2020-04-15: qty 1

## 2020-04-15 MED ORDER — CHLORHEXIDINE GLUCONATE 0.12% ORAL RINSE (MEDLINE KIT): 15.0000 mL | Freq: Two times a day (BID) | OROMUCOSAL | 0 refills | Status: DC

## 2020-04-15 NOTE — ED Triage Notes (Addendum)
Pt to ED c/o dental pain to the left lower. Reports he thinks he has an abscess d/t severe dental pain, going on 5 days. Smoking and cold good irritates the area. Reports currently prescribed ABX for the same problem.

## 2020-04-15 NOTE — Discharge Instructions (Signed)
Take the antibiotics as prescribed.   Please follow-up with a dentist in the next 5 to 7 days for reevaluation.  If you do not have a dentist, resources were provided for dentist in the area in your discharge summary.  Please contact one of the offices that are listed and make an appointment for follow-up.  Please return to the emergency department for any new or worsening symptoms.

## 2020-04-15 NOTE — ED Notes (Signed)
Patient verbalizes understanding of discharge instructions. Opportunity for questioning and answers were provided. Arm band removed by staff, patient discharged from ED. 

## 2020-04-15 NOTE — ED Provider Notes (Signed)
[facility] EMERGENCY DEPARTMENT Provider Note   CSN: [id number] Arrival date & time: 04/15/20  1211     History Chief Complaint  Patient presents with  . Dental Pain    Erik Cowan is a 34 y.o. male.  HPI   34 year old male with history of bipolar disorder, cyclic vomiting syndrome, GERD, schizophrenia, who presents the emergency department today for evaluation of left lower dental pain.  Has had pain for the last 5 days.  Pain worse with smoking and cold.  Describes pain is constant and severe in nature.  He has not had any associated fevers.  He is actually seen in the emergency department yesterday and prescribed penicillin.  He states he has not filled the prescription because he had some leftover penicillin from the last time he had a dental infection.  He has not taken any since his visit yesterday.  He has used over-the-counter medications without relief.  Past Medical History:  Diagnosis Date  . Bipolar 1 disorder ([place])   . Bipolar disorder ([place])   . Cyclic vomiting syndrome   . GERD (gastroesophageal reflux disease)   . Nephrolithiasis   . Schizophrenia, acute [facility])     Patient Active Problem List   Diagnosis Date Noted  . Cannabis use disorder, severe, dependence ([facility]) 03/19/2015  . Bipolar 1 disorder ([place]) 03/19/2015  . Heart murmur 05/23/2012  . Dehydration, mild 05/23/2012  . Hyponatremia 06/04/2011  . Hypokalemia 06/04/2011  . Leukocytosis 06/04/2011  . GERD (gastroesophageal reflux disease)   . Nephrolithiasis   . Cyclic vomiting syndrome   . Abdominal pain, epigastric 06/10/2010    Past Surgical History:  Procedure Laterality Date  . APPENDECTOMY    . unremarkable         Family History  Problem Relation Age of Onset  . Liver cancer Mother   . Colon cancer Maternal Uncle   . Diabetes Maternal Aunt   . Heart disease Paternal Grandfather   . Testicular cancer Brother     Social History   Tobacco Use  . Smoking  status: Current Every Day Smoker    Years: 1.00    Types: Cigarettes    Last attempt to quit: 06/04/2007    Years since quitting: 12.8  . Smokeless tobacco: Never Used  . Tobacco comment: see illicit drug comment  Substance Use Topics  . Alcohol use: Yes    Comment: occasional-once per month  . Drug use: Yes    Frequency: 7.0 times per week    Types: Marijuana    Comment: smokes marijuana daily    Home Medications Prior to Admission medications   Medication Sig Start Date End Date Taking? Authorizing Provider  capsaicin (ZOSTRIX) 0.025 % cream Apply 1 application topically 2 (two) times daily.    [provider]  chlorhexidine gluconate, MEDLINE KIT, (PERIDEX) 0.12 % solution Use as directed 15 mLs in the mouth or throat 2 (two) times daily. 04/15/20   Acelin Ferdig S, PA-C  chlorproMAZINE (THORAZINE) 50 MG tablet Take 1 tablet (50 mg total) by mouth 3 (three) times daily as needed for hiccoughs. Patient not taking: Reported on 09/03/2016 12/30/15   Charlann Lange, PA-C  dicyclomine (BENTYL) 20 MG tablet Take 1 tablet (20 mg total) by mouth 3 (three) times daily as needed for spasms. 11/30/16 11/30/17  Katherine Roan, MD  esomeprazole (NEXIUM) 40 MG capsule Take 1 capsule (40 mg total) by mouth daily. Patient not taking: Reported on 12/29/2015 04/22/15   Jola Schmidt,  MD  famotidine (PEPCID) 20 MG tablet Take 1 tablet (20 mg total) by mouth 2 (two) times daily. 10/15/17   Jacqlyn Larsen, PA-C  ibuprofen (ADVIL,MOTRIN) 200 MG tablet Take 800 mg by mouth every 6 (six) hours as needed (pain).    [provider]  metoCLOPramide (REGLAN) 10 MG tablet Take 1 tablet (10 mg total) by mouth every 6 (six) hours as needed for nausea or vomiting. Patient not taking: Reported on 09/03/2016 06/07/16   Orlie Dakin, MD  omeprazole (PRILOSEC) 20 MG capsule Take 1 capsule (20 mg total) by mouth daily. Patient not taking: Reported on 12/29/2015 06/04/15   Charlesetta Shanks, MD  ondansetron  [facility] ODT) 4 MG disintegrating tablet 48m ODT q4 hours prn nausea/vomit 10/15/17   FJacqlyn Larsen PA-C  penicillin v potassium (VEETID) 500 MG tablet Take 1 tablet (500 mg total) by mouth 4 (four) times daily. 04/14/20   BMontine Circle PA-C  promethazine (PHENERGAN) 25 MG tablet Take 1 tablet (25 mg total) by mouth every 8 (eight) hours as needed for nausea or vomiting. Patient not taking: Reported on 04/25/2015 04/22/15   CJola Schmidt MD  promethazine (PHENERGAN) 25 MG tablet Take 1 tablet (25 mg total) by mouth every 8 (eight) hours as needed for nausea or vomiting. Patient not taking: Reported on 11/30/2016 09/13/16   KVirgel Manifold MD  risperiDONE (RISPERDAL) 1 MG tablet Take 1 tablet (1 mg total) by mouth at bedtime. Patient not taking: Reported on 12/29/2015 03/20/15   Rankin, Shuvon B, NP  sucralfate (CARAFATE) 1 GM/10ML suspension Take 10 mLs (1 g total) by mouth 4 (four) times daily -  with meals and at bedtime. 10/15/17   FJacqlyn Larsen PA-C  traZODone (DESYREL) 50 MG tablet Take 1 tablet (50 mg total) by mouth at bedtime as needed for sleep. Patient not taking: Reported on 12/29/2015 03/20/15   Rankin, Shuvon B, NP    Allergies    Gluten meal, Milk-related compounds, and Latex  Review of Systems   Review of Systems  HENT: Positive for dental problem.     Physical Exam Updated Vital Signs BP (!) 146/82 (BP Location: Left Arm)   Pulse 78   Temp 98.2 F (36.8 C) (Oral)   Resp 18   Ht 6' 2"  (1.88 m)   Wt 72.6 kg   SpO2 100%   BMI 20.54 kg/m   Physical Exam Constitutional:      General: He is not in acute distress.    Appearance: He is well-developed.  HENT:     Mouth/Throat:     Comments: Poor dentition.  TTP to the left lower molar.  No periapical abscess.  No sublingual or submandibular swelling.  No trismus.  Tolerating secretions. Eyes:     Conjunctiva/sclera: Conjunctivae normal.  Cardiovascular:     Rate and Rhythm: Normal rate.  Pulmonary:     Effort:  Pulmonary effort is normal.  Skin:    General: Skin is warm and dry.  Neurological:     Mental Status: He is alert and oriented to person, place, and time.     ED Results / Procedures / Treatments   Labs (all labs ordered are listed, but only abnormal results are displayed) Labs Reviewed - No data to display  EKG None  Radiology No results found.  Procedures Procedures (including critical care time)  Medications Ordered in ED Medications  ketorolac (TORADOL) 30 MG/ML injection 15 mg (has no administration in time range)    ED Course  I have reviewed the triage vital signs and the nursing notes.  Pertinent labs & imaging results that were available during my care of the patient were reviewed by me and considered in my medical decision making (see chart for details).    MDM Rules/Calculators/A&P                           Patient with tooth ache.  No gross abscess.  Exam is nonconcerning for Ludwig angina or deep space infection.  He was given Rx for penicillin yesterday.  Advised on the importance of completing antibiotic prescription completely.  He was given a dose of pain medications in the emergency department.  We will also give Peridex for home and reiterated the importance of following up with a dentist.  He voices understanding and is in agreement with the plan.  All questions answered.  Patient stable for discharge.   Final Clinical Impression(s) / ED Diagnoses Final diagnoses:  Pain, dental    Rx / DC Orders ED Discharge Orders         Ordered    chlorhexidine gluconate, MEDLINE KIT, (PERIDEX) 0.12 % solution  2 times daily        04/15/20 [address], [place] Constantino S, PA-C 04/15/20 [place], [place], DO 04/15/20 1553

## 2021-01-21 ENCOUNTER — Emergency Department (HOSPITAL_COMMUNITY)
Admission: EM | Admit: 2021-01-21 | Discharge: 2021-01-21 | Disposition: A | Discharge status: Home / Self Care | Payer: Self-pay | Attending: Emergency Medicine | Admitting: Emergency Medicine

## 2021-01-21 ENCOUNTER — Emergency Department (HOSPITAL_COMMUNITY): Payer: Self-pay

## 2021-01-21 ENCOUNTER — Inpatient Hospital Stay (HOSPITAL_COMMUNITY)
Admission: EM | Admit: 2021-01-21 | Discharge: 2021-01-26 | DRG: 391 | Disposition: A | Discharge status: Home / Self Care | Payer: Self-pay | Attending: Internal Medicine | Admitting: Internal Medicine

## 2021-01-21 ENCOUNTER — Encounter (HOSPITAL_COMMUNITY): Payer: Self-pay | Admitting: Emergency Medicine

## 2021-01-21 DIAGNOSIS — R945 Abnormal results of liver function studies: Secondary | ICD-10-CM

## 2021-01-21 DIAGNOSIS — Z79899 Other long term (current) drug therapy: Secondary | ICD-10-CM

## 2021-01-21 DIAGNOSIS — E871 Hypo-osmolality and hyponatremia: Secondary | ICD-10-CM | POA: Diagnosis present

## 2021-01-21 DIAGNOSIS — N179 Acute kidney failure, unspecified: Secondary | ICD-10-CM | POA: Diagnosis present

## 2021-01-21 DIAGNOSIS — E872 Acidosis: Secondary | ICD-10-CM | POA: Diagnosis present

## 2021-01-21 DIAGNOSIS — F319 Bipolar disorder, unspecified: Secondary | ICD-10-CM | POA: Diagnosis present

## 2021-01-21 DIAGNOSIS — E8729 Other acidosis: Secondary | ICD-10-CM

## 2021-01-21 DIAGNOSIS — K219 Gastro-esophageal reflux disease without esophagitis: Secondary | ICD-10-CM | POA: Diagnosis present

## 2021-01-21 DIAGNOSIS — R7989 Other specified abnormal findings of blood chemistry: Secondary | ICD-10-CM

## 2021-01-21 DIAGNOSIS — Z20822 Contact with and (suspected) exposure to covid-19: Secondary | ICD-10-CM | POA: Diagnosis present

## 2021-01-21 DIAGNOSIS — Y92230 Patient room in hospital as the place of occurrence of the external cause: Secondary | ICD-10-CM | POA: Diagnosis not present

## 2021-01-21 DIAGNOSIS — Z91040 Latex allergy status: Secondary | ICD-10-CM

## 2021-01-21 DIAGNOSIS — Z5321 Procedure and treatment not carried out due to patient leaving prior to being seen by health care provider: Secondary | ICD-10-CM | POA: Insufficient documentation

## 2021-01-21 DIAGNOSIS — K226 Gastro-esophageal laceration-hemorrhage syndrome: Secondary | ICD-10-CM | POA: Diagnosis present

## 2021-01-21 DIAGNOSIS — E860 Dehydration: Secondary | ICD-10-CM | POA: Diagnosis present

## 2021-01-21 DIAGNOSIS — F1290 Cannabis use, unspecified, uncomplicated: Secondary | ICD-10-CM | POA: Diagnosis present

## 2021-01-21 DIAGNOSIS — D696 Thrombocytopenia, unspecified: Secondary | ICD-10-CM | POA: Diagnosis present

## 2021-01-21 DIAGNOSIS — M6282 Rhabdomyolysis: Secondary | ICD-10-CM | POA: Diagnosis present

## 2021-01-21 DIAGNOSIS — A084 Viral intestinal infection, unspecified: Principal | ICD-10-CM | POA: Diagnosis present

## 2021-01-21 DIAGNOSIS — Z87442 Personal history of urinary calculi: Secondary | ICD-10-CM

## 2021-01-21 DIAGNOSIS — R112 Nausea with vomiting, unspecified: Secondary | ICD-10-CM

## 2021-01-21 DIAGNOSIS — E861 Hypovolemia: Secondary | ICD-10-CM | POA: Diagnosis present

## 2021-01-21 DIAGNOSIS — R109 Unspecified abdominal pain: Secondary | ICD-10-CM | POA: Insufficient documentation

## 2021-01-21 DIAGNOSIS — W19XXXA Unspecified fall, initial encounter: Secondary | ICD-10-CM | POA: Diagnosis not present

## 2021-01-21 DIAGNOSIS — Z91018 Allergy to other foods: Secondary | ICD-10-CM

## 2021-01-21 DIAGNOSIS — F17210 Nicotine dependence, cigarettes, uncomplicated: Secondary | ICD-10-CM | POA: Diagnosis present

## 2021-01-21 DIAGNOSIS — D72829 Elevated white blood cell count, unspecified: Secondary | ICD-10-CM | POA: Diagnosis present

## 2021-01-21 DIAGNOSIS — D649 Anemia, unspecified: Secondary | ICD-10-CM | POA: Diagnosis present

## 2021-01-21 DIAGNOSIS — Z91011 Allergy to milk products: Secondary | ICD-10-CM

## 2021-01-21 LAB — CBC WITH DIFFERENTIAL/PLATELET
Abs Immature Granulocytes: 0.16 10*3/uL — ABNORMAL HIGH (ref 0.00–0.07)
Abs Immature Granulocytes: 0.16 10*3/uL — ABNORMAL HIGH (ref 0.00–0.07)
Basophils Absolute: 0 10*3/uL (ref 0.0–0.1)
Basophils Absolute: 0 10*3/uL (ref 0.0–0.1)
Basophils Relative: 0 %
Basophils Relative: 0 %
Eosinophils Absolute: 0 10*3/uL (ref 0.0–0.5)
Eosinophils Absolute: 0 10*3/uL (ref 0.0–0.5)
Eosinophils Relative: 0 %
Eosinophils Relative: 0 %
HCT: 45.9 % (ref 39.0–52.0)
HCT: 41.2 % (ref 39.0–52.0)
Hemoglobin: 17.1 g/dL — ABNORMAL HIGH (ref 13.0–17.0)
Hemoglobin: 15.5 g/dL (ref 13.0–17.0)
Immature Granulocytes: 1 %
Immature Granulocytes: 1 %
Lymphocytes Relative: 4 %
Lymphocytes Relative: 4 %
Lymphs Abs: 0.9 10*3/uL (ref 0.7–4.0)
Lymphs Abs: 0.9 10*3/uL (ref 0.7–4.0)
MCH: 34.1 pg — ABNORMAL HIGH (ref 26.0–34.0)
MCH: 34 pg (ref 26.0–34.0)
MCHC: 37.3 g/dL — ABNORMAL HIGH (ref 30.0–36.0)
MCHC: 37.6 g/dL — ABNORMAL HIGH (ref 30.0–36.0)
MCV: 91.6 fL (ref 80.0–100.0)
MCV: 90.4 fL (ref 80.0–100.0)
Monocytes Absolute: 1.6 10*3/uL — ABNORMAL HIGH (ref 0.1–1.0)
Monocytes Absolute: 1.6 10*3/uL — ABNORMAL HIGH (ref 0.1–1.0)
Monocytes Relative: 8 %
Monocytes Relative: 8 %
Neutro Abs: 18.3 10*3/uL — ABNORMAL HIGH (ref 1.7–7.7)
Neutro Abs: 18.3 10*3/uL — ABNORMAL HIGH (ref 1.7–7.7)
Neutrophils Relative %: 87 %
Neutrophils Relative %: 87 %
Platelets: 199 10*3/uL (ref 150–400)
Platelets: 193 10*3/uL (ref 150–400)
RBC: 5.01 MIL/uL (ref 4.22–5.81)
RBC: 4.56 MIL/uL (ref 4.22–5.81)
RDW: 11.3 % — ABNORMAL LOW (ref 11.5–15.5)
RDW: 11.3 % — ABNORMAL LOW (ref 11.5–15.5)
WBC: 20.9 10*3/uL — ABNORMAL HIGH (ref 4.0–10.5)
WBC: 21.1 10*3/uL — ABNORMAL HIGH (ref 4.0–10.5)
nRBC: 0 % (ref 0.0–0.2)
nRBC: 0 % (ref 0.0–0.2)

## 2021-01-21 LAB — LIPASE, BLOOD
Lipase: 26 U/L (ref 11–51)
Lipase: 26 U/L (ref 11–51)

## 2021-01-21 LAB — URINALYSIS, ROUTINE W REFLEX MICROSCOPIC
Bilirubin Urine: NEGATIVE
Glucose, UA: NEGATIVE mg/dL
Ketones, ur: NEGATIVE mg/dL
Leukocytes,Ua: NEGATIVE
Nitrite: NEGATIVE
Protein, ur: 100 mg/dL — AB
Specific Gravity, Urine: 1.014 (ref 1.005–1.030)
pH: 5 (ref 5.0–8.0)

## 2021-01-21 LAB — BASIC METABOLIC PANEL
Anion gap: 25 — ABNORMAL HIGH (ref 5–15)
BUN: 120 mg/dL — ABNORMAL HIGH (ref 6–20)
CO2: 26 mmol/L (ref 22–32)
Calcium: 9.8 mg/dL (ref 8.9–10.3)
Chloride: 74 mmol/L — ABNORMAL LOW (ref 98–111)
Creatinine, Ser: 9.26 mg/dL — ABNORMAL HIGH (ref 0.61–1.24)
GFR, Estimated: 7 mL/min — ABNORMAL LOW (ref >60)
Glucose, Bld: 103 mg/dL — ABNORMAL HIGH (ref 70–99)
Potassium: 5 mmol/L (ref 3.5–5.1)
Sodium: 125 mmol/L — ABNORMAL LOW (ref 135–145)

## 2021-01-21 LAB — COMPREHENSIVE METABOLIC PANEL
ALT: 42 U/L (ref 0–44)
ALT: 46 U/L — ABNORMAL HIGH (ref 0–44)
AST: 138 U/L — ABNORMAL HIGH (ref 15–41)
AST: 143 U/L — ABNORMAL HIGH (ref 15–41)
Albumin: 5.6 g/dL — ABNORMAL HIGH (ref 3.5–5.0)
Albumin: 5 g/dL (ref 3.5–5.0)
Alkaline Phosphatase: 85 U/L (ref 38–126)
Alkaline Phosphatase: 75 U/L (ref 38–126)
Anion gap: 33 — ABNORMAL HIGH (ref 5–15)
Anion gap: 31 — ABNORMAL HIGH (ref 5–15)
BUN: 117 mg/dL — ABNORMAL HIGH (ref 6–20)
BUN: 123 mg/dL — ABNORMAL HIGH (ref 6–20)
CO2: 23 mmol/L (ref 22–32)
CO2: 22 mmol/L (ref 22–32)
Calcium: 11.3 mg/dL — ABNORMAL HIGH (ref 8.9–10.3)
Calcium: 9.4 mg/dL (ref 8.9–10.3)
Chloride: 67 mmol/L — ABNORMAL LOW (ref 98–111)
Chloride: 71 mmol/L — ABNORMAL LOW (ref 98–111)
Creatinine, Ser: 11.42 mg/dL — ABNORMAL HIGH (ref 0.61–1.24)
Creatinine, Ser: 10.09 mg/dL — ABNORMAL HIGH (ref 0.61–1.24)
GFR, Estimated: 5 mL/min — ABNORMAL LOW (ref >60)
GFR, Estimated: 6 mL/min — ABNORMAL LOW (ref >60)
Glucose, Bld: 126 mg/dL — ABNORMAL HIGH (ref 70–99)
Glucose, Bld: 112 mg/dL — ABNORMAL HIGH (ref 70–99)
Potassium: 5.1 mmol/L (ref 3.5–5.1)
Potassium: 4.6 mmol/L (ref 3.5–5.1)
Sodium: 123 mmol/L — ABNORMAL LOW (ref 135–145)
Sodium: 124 mmol/L — ABNORMAL LOW (ref 135–145)
Total Bilirubin: 0.9 mg/dL (ref 0.3–1.2)
Total Bilirubin: 1.1 mg/dL (ref 0.3–1.2)
Total Protein: 10.5 g/dL — ABNORMAL HIGH (ref 6.5–8.1)
Total Protein: 9.4 g/dL — ABNORMAL HIGH (ref 6.5–8.1)

## 2021-01-21 LAB — I-STAT CHEM 8, ED
BUN: 126 mg/dL — ABNORMAL HIGH (ref 6–20)
Calcium, Ion: 0.9 mmol/L — ABNORMAL LOW (ref 1.15–1.40)
Chloride: 78 mmol/L — ABNORMAL LOW (ref 98–111)
Creatinine, Ser: 11.8 mg/dL — ABNORMAL HIGH (ref 0.61–1.24)
Glucose, Bld: 115 mg/dL — ABNORMAL HIGH (ref 70–99)
HCT: 44 % (ref 39.0–52.0)
Hemoglobin: 15 g/dL (ref 13.0–17.0)
Potassium: 5.1 mmol/L (ref 3.5–5.1)
Sodium: 117 mmol/L — CL (ref 135–145)
TCO2: 25 mmol/L (ref 22–32)

## 2021-01-21 LAB — RAPID URINE DRUG SCREEN, HOSP PERFORMED
Amphetamines: NOT DETECTED
Barbiturates: NOT DETECTED
Benzodiazepines: NOT DETECTED
Cocaine: NOT DETECTED
Opiates: NOT DETECTED
Tetrahydrocannabinol: POSITIVE — AB

## 2021-01-21 LAB — SODIUM, URINE, RANDOM: Sodium, Ur: 26 mmol/L

## 2021-01-21 LAB — CREATININE, URINE, RANDOM: Creatinine, Urine: 117.99 mg/dL

## 2021-01-21 LAB — MAGNESIUM: Magnesium: 2.7 mg/dL — ABNORMAL HIGH (ref 1.7–2.4)

## 2021-01-21 LAB — RESP PANEL BY RT-PCR (FLU A&B, COVID) ARPGX2
Influenza A by PCR: NEGATIVE
Influenza B by PCR: NEGATIVE
SARS Coronavirus 2 by RT PCR: NEGATIVE

## 2021-01-21 LAB — CK: Total CK: 10864 U/L — ABNORMAL HIGH (ref 49–397)

## 2021-01-21 MED ORDER — LACTATED RINGERS IV BOLUS: 1000.0000 mL | Freq: Once | INTRAVENOUS | Status: DC

## 2021-01-21 MED ORDER — LACTATED RINGERS IV BOLUS
1000.0000 mL | Freq: Once | INTRAVENOUS | Status: AC
  Administered 2021-01-21: 1000 mL via INTRAVENOUS

## 2021-01-21 MED ORDER — HYDROMORPHONE HCL 1 MG/ML IJ SOLN
0.5000 mg | Freq: Once | INTRAMUSCULAR | Status: AC
  Administered 2021-01-21: 0.5 mg via INTRAVENOUS
  Filled 2021-01-21: qty 1

## 2021-01-21 MED ORDER — PANTOPRAZOLE SODIUM 40 MG IV SOLR
40.0000 mg | Freq: Every day | INTRAVENOUS | Status: DC
  Administered 2021-01-22 – 2021-01-24 (×3): 40 mg via INTRAVENOUS
  Filled 2021-01-21 (×3): qty 40

## 2021-01-21 MED ORDER — LACTATED RINGERS IV BOLUS
500.0000 mL | Freq: Once | INTRAVENOUS | Status: AC
  Administered 2021-01-21: 500 mL via INTRAVENOUS

## 2021-01-21 MED ORDER — ONDANSETRON HCL 4 MG/2ML IJ SOLN
4.0000 mg | Freq: Four times a day (QID) | INTRAMUSCULAR | Status: DC | PRN
Start: 2021-01-21 — End: 2021-01-26
  Administered 2021-01-21 – 2021-01-26 (×10): 4 mg via INTRAVENOUS
  Filled 2021-01-21 (×11): qty 2

## 2021-01-21 MED ORDER — METOCLOPRAMIDE HCL 5 MG/ML IJ SOLN
10.0000 mg | Freq: Once | INTRAMUSCULAR | Status: AC
  Administered 2021-01-21: 10 mg via INTRAVENOUS
  Filled 2021-01-21: qty 2

## 2021-01-21 MED ORDER — ONDANSETRON 4 MG PO TBDP: 4.0000 mg | ORAL_TABLET | Freq: Once | ORAL | Status: DC

## 2021-01-21 MED ORDER — CAPSAICIN 0.075 % EX CREA
TOPICAL_CREAM | Freq: Once | CUTANEOUS | Status: AC
  Administered 2021-01-21: 1 via TOPICAL
  Filled 2021-01-21: qty 60

## 2021-01-21 MED ORDER — HYDROMORPHONE HCL 1 MG/ML IJ SOLN
0.5000 mg | INTRAMUSCULAR | Status: AC | PRN
  Administered 2021-01-21 – 2021-01-22 (×3): 0.5 mg via INTRAVENOUS
  Filled 2021-01-21 (×4): qty 1

## 2021-01-21 MED ORDER — PANTOPRAZOLE SODIUM 40 MG IV SOLR
40.0000 mg | Freq: Once | INTRAVENOUS | Status: AC
  Administered 2021-01-21: 40 mg via INTRAVENOUS
  Filled 2021-01-21: qty 40

## 2021-01-21 MED ORDER — PANTOPRAZOLE SODIUM 40 MG IV SOLR: 40.0000 mg | Freq: Every day | INTRAVENOUS | Status: DC

## 2021-01-21 MED ORDER — LACTATED RINGERS IV SOLN: INTRAVENOUS | Status: DC

## 2021-01-21 NOTE — ED Triage Notes (Signed)
States he has been throwing up since Friday and hasnt been able to keep anything down.

## 2021-01-21 NOTE — ED Notes (Signed)
Pt called for registration and vitals, no response. [facility] employee stated they saw pt get in a car and leave. Moving pt OTF.

## 2021-01-21 NOTE — ED Triage Notes (Signed)
Pt here from home with abd pain with n/v after eating some cookies , denis etoh and weed

## 2021-01-21 NOTE — H&P (Signed)
History and Physical    Erik Cowan NLG:[id number] DOB: 25-Oct-1985 DOA: 01/21/2021  PCP: Patient, No Pcp Per (Inactive)  Patient coming from: Home  I have personally briefly reviewed patient's old medical records in [place]  Chief Complaint: Intractable nausea and vomiting  HPI: Erik Cowan is a 35 y.o. male with medical history significant for history of cyclic vomiting, [place] use, bipolar disorder type I and GERD who presents with concerns of intractable nausea and vomiting.  Patient reports that about 5 days ago he began to have persistent nausea and vomiting.  Has noticed vomitus being brown with some blood tinged.  Thinks it was due to some cookies that he ate.  Denies the cookies being laced with anything.  No one else with similar symptoms to him.  He reports diffuse abdominal pain.  No fever.  Denies any diarrhea.  Has not been able to eat or drink for 5 days.  Has not been able to produce urine for the past 5 days.  Also has lower back pain. Endorsed tobacco and marijuana use although states it was about 12 days ago since last use.  Denies alcohol use.  ED Course: He was afebrile, hypertensive up to 145/115.  He initially presented to [facility] and had sodium of 123 but left and later arrived here at [facility] long ED with repeat sodium of 117.  K of 5.1.  Creatinine of 11.8 with no recent for comparison.  BG of 115.  CT renal stone search was otherwise unremarkable.  Review of Systems: Constitutional: No Weight Change, No Fever ENT/Mouth: No sore throat, No Rhinorrhea Eyes: No Eye Pain, No Vision Changes Cardiovascular: No Chest Pain, no SOB Respiratory: No Cough Gastrointestinal: +Nausea, + Vomiting, No Diarrhea, No Constipation, + Pain Genitourinary: no Urinary Incontinence, No Urgency, No Flank Pain Musculoskeletal: No Arthralgias, No Myalgias Skin: No Skin Lesions, No Pruritus, Neuro: no Weakness, No Numbness Psych: No Anxiety/Panic, No  Depression, + decrease appetite Heme/Lymph: No Bruising, No Bleeding  Past Medical History:  Diagnosis Date   Bipolar 1 disorder ([facility])    Bipolar disorder ([facility])    Cyclic vomiting syndrome    GERD (gastroesophageal reflux disease)    Nephrolithiasis    Schizophrenia, acute ([facility])     Past Surgical History:  Procedure Laterality Date   APPENDECTOMY     unremarkable       reports that he has been smoking cigarettes. He has never used smokeless tobacco. He reports current alcohol use. He reports current drug use. Frequency: 7.00 times per week. Drug: Marijuana. Social History  Allergies  Allergen Reactions   Gluten Meal Nausea And Vomiting    Stomach pains   Milk-Related Compounds Nausea And Vomiting    Stomach upset   Latex Rash    Family History  Problem Relation Age of Onset   Liver cancer Mother    Colon cancer Maternal Uncle    Diabetes Maternal Aunt    Heart disease Paternal Grandfather    Testicular cancer Brother      Prior to Admission medications   Medication Sig Start Date End Date Taking? Authorizing Provider  capsaicin (ZOSTRIX) 0.025 % cream Apply 1 application topically 2 (two) times daily.    [provider]  chlorhexidine gluconate, MEDLINE KIT, (PERIDEX) 0.12 % solution Use as directed 15 mLs in the mouth or throat 2 (two) times daily. 04/15/20   Couture, Cortni S, PA-C  chlorproMAZINE (THORAZINE) 50 MG tablet Take 1 tablet (50 mg total) by mouth  3 (three) times daily as needed for hiccoughs. Patient not taking: Reported on 09/03/2016 12/30/15   Charlann Lange, PA-C  dicyclomine (BENTYL) 20 MG tablet Take 1 tablet (20 mg total) by mouth 3 (three) times daily as needed for spasms. 11/30/16 11/30/17  Katherine Roan, MD  esomeprazole (NEXIUM) 40 MG capsule Take 1 capsule (40 mg total) by mouth daily. Patient not taking: Reported on 12/29/2015 04/22/15   Jola Schmidt, MD  famotidine (PEPCID) 20 MG tablet Take 1 tablet (20 mg total) by mouth 2 (two)  times daily. 10/15/17   Jacqlyn Larsen, PA-C  ibuprofen (ADVIL,MOTRIN) 200 MG tablet Take 800 mg by mouth every 6 (six) hours as needed (pain).    [provider]  metoCLOPramide (REGLAN) 10 MG tablet Take 1 tablet (10 mg total) by mouth every 6 (six) hours as needed for nausea or vomiting. Patient not taking: Reported on 09/03/2016 06/07/16   Orlie Dakin, MD  omeprazole (PRILOSEC) 20 MG capsule Take 1 capsule (20 mg total) by mouth daily. Patient not taking: Reported on 12/29/2015 06/04/15   Charlesetta Shanks, MD  ondansetron [facility] ODT) 4 MG disintegrating tablet 22m ODT q4 hours prn nausea/vomit 10/15/17   FJacqlyn Larsen PA-C  penicillin v potassium (VEETID) 500 MG tablet Take 1 tablet (500 mg total) by mouth 4 (four) times daily. 04/14/20   BMontine Circle PA-C  promethazine (PHENERGAN) 25 MG tablet Take 1 tablet (25 mg total) by mouth every 8 (eight) hours as needed for nausea or vomiting. Patient not taking: Reported on 04/25/2015 04/22/15   CJola Schmidt MD  promethazine (PHENERGAN) 25 MG tablet Take 1 tablet (25 mg total) by mouth every 8 (eight) hours as needed for nausea or vomiting. Patient not taking: Reported on 11/30/2016 09/13/16   KVirgel Manifold MD  risperiDONE (RISPERDAL) 1 MG tablet Take 1 tablet (1 mg total) by mouth at bedtime. Patient not taking: Reported on 12/29/2015 03/20/15   Rankin, Shuvon B, NP  sucralfate (CARAFATE) 1 GM/10ML suspension Take 10 mLs (1 g total) by mouth 4 (four) times daily -  with meals and at bedtime. 10/15/17   FJacqlyn Larsen PA-C  traZODone (DESYREL) 50 MG tablet Take 1 tablet (50 mg total) by mouth at bedtime as needed for sleep. Patient not taking: Reported on 12/29/2015 03/20/15   REarleen NewportB, NP    Physical Exam: Vitals:   01/21/21 1944 01/21/21 1947 01/21/21 1949 01/21/21 1950  BP: (!) 145/115  (!) 159/137   Pulse: 86  92   Resp: (!) 23  (!) 21   Temp: 98.1 F (36.7 C)  97.9 F (36.6 C)   TempSrc: Oral  Oral   SpO2: 100% 96%  100%   Weight: 70.3 kg   70.3 kg  Height: 6' 2"  (1.88 m)   6' 2"  (1.88 m)    Constitutional: Ill-appearing young male laying on the left side in bed with hiccuping and feeling nauseous Vitals:   01/21/21 1944 01/21/21 1947 01/21/21 1949 01/21/21 1950  BP: (!) 145/115  (!) 159/137   Pulse: 86  92   Resp: (!) 23  (!) 21   Temp: 98.1 F (36.7 C)  97.9 F (36.6 C)   TempSrc: Oral  Oral   SpO2: 100% 96% 100%   Weight: 70.3 kg   70.3 kg  Height: 6' 2"  (1.88 m)   6' 2"  (1.88 m)   Eyes: PERRL, lids and conjunctivae normal ENMT: Mucous membranes are moist.  Neck: normal, supple Respiratory: clear  to auscultation bilaterally, no wheezing, no crackles. Normal respiratory effort. No accessory muscle use.  Cardiovascular: Regular rate and rhythm, no murmurs / rubs / gallops. No extremity edema.  Abdomen: Diffuse abdominal pain without any guarding, rigidity or rebound tenderness, bowel sounds positive.  Musculoskeletal: no clubbing / cyanosis. No joint deformity upper and lower extremities. Good ROM, no contractures. Normal muscle tone.  Skin: no rashes, lesions, ulcers. No induration Neurologic: CN 2-12 grossly intact. Sensation intact, Strength 5/5 in all 4.  Psychiatric: Normal judgment and insight. Alert and oriented x 3. Normal mood.     Labs on Admission: I have personally reviewed following labs and imaging studies  CBC: Recent Labs  Lab 01/21/21 1321 01/21/21 1955 01/21/21 2041  WBC 20.9* 21.1*  --   NEUTROABS 18.3* 18.3*  --   HGB 17.1* 15.5 15.0  HCT 45.9 41.2 44.0  MCV 91.6 90.4  --   PLT 199 193  --    Basic Metabolic Panel: Recent Labs  Lab 01/21/21 1321 01/21/21 1955 01/21/21 2041  NA 123* 124* 117*  K 5.1 4.6 5.1  CL 67* 71* 78*  CO2 23 22  --   GLUCOSE 126* 112* 115*  BUN 117* 123* 126*  CREATININE 11.42* 10.09* 11.80*  CALCIUM 11.3* 9.4  --   MG  --  2.7*  --    GFR: Estimated Creatinine Clearance: 8.7 mL/min (A) (by C-G formula based on SCr of 11.8  mg/dL (H)). Liver Function Tests: Recent Labs  Lab 01/21/21 1321 01/21/21 1955  AST 138* 143*  ALT 42 46*  ALKPHOS 85 75  BILITOT 0.9 1.1  PROT 10.5* 9.4*  ALBUMIN 5.6* 5.0   Recent Labs  Lab 01/21/21 1321 01/21/21 1955  LIPASE 26 26   No results for input(s): AMMONIA in the last 168 hours. Coagulation Profile: No results for input(s): INR, PROTIME in the last 168 hours. Cardiac Enzymes: Recent Labs  Lab 01/21/21 1955  CKTOTAL 10,864*   BNP (last 3 results) No results for input(s): PROBNP in the last 8760 hours. HbA1C: No results for input(s): HGBA1C in the last 72 hours. CBG: No results for input(s): GLUCAP in the last 168 hours. Lipid Profile: No results for input(s): CHOL, HDL, LDLCALC, TRIG, CHOLHDL, LDLDIRECT in the last 72 hours. Thyroid Function Tests: No results for input(s): TSH, T4TOTAL, FREET4, T3FREE, THYROIDAB in the last 72 hours. Anemia Panel: No results for input(s): VITAMINB12, FOLATE, FERRITIN, TIBC, IRON, RETICCTPCT in the last 72 hours. Urine analysis:    Component Value Date/Time   COLORURINE AMBER (A) 06/07/2016 2026   APPEARANCEUR CLEAR 06/07/2016 2026   LABSPEC 1.028 06/07/2016 2026   PHURINE 7.0 06/07/2016 2026   GLUCOSEU NEGATIVE 06/07/2016 2026   HGBUR SMALL (A) 06/07/2016 2026   BILIRUBINUR NEGATIVE 06/07/2016 2026   KETONESUR NEGATIVE 06/07/2016 2026   PROTEINUR 30 (A) 06/07/2016 2026   UROBILINOGEN 0.2 03/12/2015 0341   NITRITE NEGATIVE 06/07/2016 2026   LEUKOCYTESUR NEGATIVE 06/07/2016 2026    Radiological Exams on Admission: DG Chest Portable 1 View  Result Date: 01/21/2021 CLINICAL DATA:  Vomiting EXAM: PORTABLE CHEST 1 VIEW COMPARISON:  12/30/2015 FINDINGS: The heart size and mediastinal contours are within normal limits. Both lungs are clear. The visualized skeletal structures are unremarkable. IMPRESSION: Normal study. Electronically Signed   By: Rolm Baptise M.D.   On: 01/21/2021 20:14   CT Renal Stone Study  Result  Date: 01/21/2021 CLINICAL DATA:  Flank pain, kidney stone suspected. Nausea and vomiting. Symptoms began 4 days  after eating chocolate chip cookies. EXAM: CT ABDOMEN AND PELVIS WITHOUT CONTRAST TECHNIQUE: Multidetector CT imaging of the abdomen and pelvis was performed following the standard protocol without IV contrast. COMPARISON:  04/04/2015 FINDINGS: Lower chest: Lung bases are clear. Hepatobiliary: No focal liver abnormality is seen. No gallstones, gallbladder wall thickening, or biliary dilatation. Pancreas: Unremarkable. No pancreatic ductal dilatation or surrounding inflammatory changes. Spleen: Normal in size without focal abnormality. Adrenals/Urinary Tract: Adrenal glands are unremarkable. Kidneys are normal, without renal calculi, focal lesion, or hydronephrosis. Bladder is unremarkable. Stomach/Bowel: Stomach, small bowel, and colon are not abnormally distended. Small bowel are mostly decompressed, limiting evaluation of the wall but no specific inflammatory changes are identified. Residual contrast material or opaque medication in the colon. Surgical absence of the appendix. Vascular/Lymphatic: No significant vascular findings are present. No enlarged abdominal or pelvic lymph nodes. Reproductive: Prostate is unremarkable. Other: No free air or free fluid in the abdomen. Abdominal wall musculature appears intact. Musculoskeletal: No acute bony abnormalities. IMPRESSION: No renal or ureteral stone or obstruction. No evidence of bowel obstruction or inflammation. Electronically Signed   By: Lucienne Capers M.D.   On: 01/21/2021 21:23      Assessment/Plan  Intractable nausea and vomiting -Unclear etiology.  Could be viral gastroenteritis versus marijuana use since he has history of cyclic vomiting. -Check UDS - PRN Zofran  Hematemesis -Patient with coffee-ground emesis noted.  Possibly mild Mallory-Weiss tear from intractable nausea and vomiting - Hemoglobin is stable.  Continue to  monitor. -Give daily IV PPI  Hyponatremia - Sodium of 117 on admission.  Likely from hypovolemia from GI loss -Repeat sodium q4hrs -Has received IV LR 1.5 liter -continuous IV LR 50cc/hr  Acute renal failure/high anion gap metabolic acidosis - Creatinine of 11.8 and has not had any urine output reportedly for 5 days -Suspect both prerenal and ATN -Check bladder scan, UA, urine sodium and creatinine -No hydronephrosis obstruction noted on CT renal stone search -No urgent dialysis needed at this time  Hx of bipolar disorder type I - Patient denies taking any prescription medication  DVT prophylaxis:.Lovenox Code Status: Full Family Communication: Plan discussed with patient at bedside  disposition Plan: Home with at least 2 midnight stays  Consults called:  Admission status: inpatient  Level of care: Progressive  Status is: Inpatient  Remains inpatient appropriate because:Inpatient level of care appropriate due to severity of illness  Dispo: The patient is from: Home              Anticipated d/c is to: Home              Patient currently is not medically stable to d/c.   Difficult to place patient No         Orene Desanctis DO [facility]   If 7PM-7AM, please contact night-coverage [email or web address]   01/21/2021, 10:02 PM

## 2021-01-21 NOTE — ED Provider Notes (Signed)
[facility]-EMERGENCY DEPT Provider Note   CSN: [id number] Arrival date & time: 01/21/21  1934     History Chief Complaint  Patient presents with   Vomiting    Erik Cowan is a 35 y.o. male.  HPI Patient presents for 5 days of nausea, vomiting, and p.o. intolerance.  He reports that he has been unable to keep anything down for the past 5 days.  He states that he has not urinated in the past 3 days.  His last bowel movement was 5 days ago.  He states that his vomit has had coffee-ground appearance.  Currently, he endorses continued severe nausea and generalized abdominal pain.  Per chart review, patient had a hospital admission in January for severe vomiting and p.o. intolerance.  He states that his current symptoms are similar to symptoms at that time.  Patient denies any drug use other than marijuana.  He denies any alcohol use.  He denies any areas of discomfort, besides his abdomen.    Past Medical History:  Diagnosis Date   Bipolar 1 disorder (HCC)    Bipolar disorder (HCC)    Cyclic vomiting syndrome    GERD (gastroesophageal reflux disease)    Nephrolithiasis    Schizophrenia, acute (HCC)     Patient Active Problem List   Diagnosis Date Noted   Intractable nausea and vomiting 01/22/2021   Acute renal failure (ARF) (HCC) 01/22/2021   Metabolic acidosis, increased anion gap (IAG) 01/22/2021   Cannabis use disorder, severe, dependence (HCC) 03/19/2015   Bipolar 1 disorder (HCC) 03/19/2015   Heart murmur 05/23/2012   Dehydration, mild 05/23/2012   Hyponatremia 06/04/2011   Hypokalemia 06/04/2011   Leukocytosis 06/04/2011   GERD (gastroesophageal reflux disease)    Nephrolithiasis    Cyclic vomiting syndrome    Abdominal pain, epigastric 06/10/2010    Past Surgical History:  Procedure Laterality Date   APPENDECTOMY     unremarkable         Family History  Problem Relation Age of Onset   Liver cancer Mother    Colon cancer Maternal  Uncle    Diabetes Maternal Aunt    Heart disease Paternal Grandfather    Testicular cancer Brother     Social History   Tobacco Use   Smoking status: Every Day    Years: 1.00    Types: Cigarettes    Last attempt to quit: 06/04/2007    Years since quitting: 13.6   Smokeless tobacco: Never   Tobacco comments:    see illicit drug comment  Substance Use Topics   Alcohol use: Yes    Comment: occasional-once per month   Drug use: Yes    Frequency: 7.0 times per week    Types: Marijuana    Comment: smokes marijuana daily    Home Medications Prior to Admission medications   Not on File    Allergies    Gluten meal, Milk-related compounds, and Latex  Review of Systems   Review of Systems  Constitutional:  Positive for activity change, appetite change and fatigue. Negative for chills and fever.  HENT:  Negative for congestion, ear pain, sore throat and trouble swallowing.   Eyes:  Negative for pain and visual disturbance.  Respiratory:  Negative for cough, chest tightness, shortness of breath and wheezing.   Cardiovascular:  Negative for chest pain and palpitations.  Gastrointestinal:  Positive for abdominal pain, nausea and vomiting. Negative for abdominal distention and diarrhea.  Genitourinary:  Negative for flank pain.  Anuric for the past 3 days  Musculoskeletal:  Negative for arthralgias, back pain and neck pain.  Skin:  Negative for color change and rash.  Neurological:  Negative for seizures, syncope, weakness, numbness and headaches.  All other systems reviewed and are negative.  Physical Exam Updated Vital Signs BP (!) 155/101   Pulse 75   Temp 97.9 F (36.6 C) (Oral)   Resp 18   Ht  (1.88 m)   Wt 70.3 kg   SpO2 100%   BMI 19.90 kg/m   Physical Exam Vitals and nursing note reviewed.  Constitutional:      General: He is in acute distress.     Appearance: He is well-developed. He is ill-appearing. He is not diaphoretic.  HENT:     Head:  Normocephalic and atraumatic.     Right Ear: External ear normal.     Left Ear: External ear normal.     Nose: Nose normal.     Mouth/Throat:     Mouth: Mucous membranes are moist.     Pharynx: Oropharynx is clear.  Eyes:     Extraocular Movements: Extraocular movements intact.     Conjunctiva/sclera: Conjunctivae normal.  Cardiovascular:     Rate and Rhythm: Normal rate and regular rhythm.     Heart sounds: No murmur heard. Pulmonary:     Effort: Pulmonary effort is normal. No respiratory distress.     Breath sounds: Normal breath sounds. No wheezing or rales.  Chest:     Chest wall: No tenderness.  Abdominal:     General: There is no distension.     Palpations: Abdomen is soft.     Tenderness: There is abdominal tenderness. There is no right CVA tenderness, left CVA tenderness, guarding or rebound.  Musculoskeletal:        General: No swelling or deformity. Normal range of motion.     Cervical back: Normal range of motion and neck supple.     Right lower leg: No edema.     Left lower leg: No edema.  Skin:    General: Skin is warm and dry.     Coloration: Skin is not jaundiced or pale.  Neurological:     General: No focal deficit present.     Mental Status: He is alert and oriented to person, place, and time.    ED Results / Procedures / Treatments   Labs (all labs ordered are listed, but only abnormal results are displayed) Labs Reviewed  COMPREHENSIVE METABOLIC PANEL - Abnormal; Notable for the following components:      Result Value   Sodium 124 (*)    Chloride 71 (*)    Glucose, Bld 112 (*)    BUN 123 (*)    Creatinine, Ser 10.09 (*)    Total Protein 9.4 (*)    AST 143 (*)    ALT 46 (*)    GFR, Estimated 6 (*)    Anion gap 31 (*)    All other components within normal limits  CBC WITH DIFFERENTIAL/PLATELET - Abnormal; Notable for the following components:   WBC 21.1 (*)    MCHC 37.6 (*)    RDW 11.3 (*)    Neutro Abs 18.3 (*)    Monocytes Absolute 1.6 (*)     Abs Immature Granulocytes 0.16 (*)    All other components within normal limits  URINALYSIS, ROUTINE W REFLEX MICROSCOPIC - Abnormal; Notable for the following components:   APPearance CLOUDY (*)    Hgb urine  dipstick LARGE (*)    Protein, ur 100 (*)    Bacteria, UA FEW (*)    All other components within normal limits  MAGNESIUM - Abnormal; Notable for the following components:   Magnesium 2.7 (*)    All other components within normal limits  CK - Abnormal; Notable for the following components:   Total CK 10,864 (*)    All other components within normal limits  BASIC METABOLIC PANEL - Abnormal; Notable for the following components:   Sodium 125 (*)    Chloride 74 (*)    Glucose, Bld 103 (*)    BUN 120 (*)    Creatinine, Ser 9.26 (*)    GFR, Estimated 7 (*)    Anion gap 25 (*)    All other components within normal limits  RAPID URINE DRUG SCREEN, HOSP PERFORMED - Abnormal; Notable for the following components:   Tetrahydrocannabinol POSITIVE (*)    All other components within normal limits  I-STAT CHEM 8, ED - Abnormal; Notable for the following components:   Sodium 117 (*)    Chloride 78 (*)    BUN 126 (*)    Creatinine, Ser 11.80 (*)    Glucose, Bld 115 (*)    Calcium, Ion 0.90 (*)    All other components within normal limits  RESP PANEL BY RT-PCR (FLU A&B, COVID) ARPGX2  LIPASE, BLOOD  HIV ANTIBODY (ROUTINE TESTING W REFLEX)  SODIUM, URINE, RANDOM  CREATININE, URINE, RANDOM  BASIC METABOLIC PANEL  BASIC METABOLIC PANEL  BASIC METABOLIC PANEL    EKG EKG Interpretation  Date/Time:  Wednesday January 21 2021 20:17:44 EDT Ventricular Rate:  74 PR Interval:  172 QRS Duration: 88 QT Interval:  382 QTC Calculation: 424 R Axis:   96 Text Interpretation: Sinus rhythm RAE, consider biatrial enlargement Borderline right axis deviation Left ventricular hypertrophy ST elevation suggests early repol Confirmed by Gloris Manchester [phone]) on 01/21/2021 8:27:23 PM  Radiology DG  Chest Portable 1 View  Result Date: 01/21/2021 CLINICAL DATA:  Vomiting EXAM: PORTABLE CHEST 1 VIEW COMPARISON:  12/30/2015 FINDINGS: The heart size and mediastinal contours are within normal limits. Both lungs are clear. The visualized skeletal structures are unremarkable. IMPRESSION: Normal study. Electronically Signed   By: Charlett Nose M.D.   On: 01/21/2021 20:14   CT Renal Stone Study  Result Date: 01/21/2021 CLINICAL DATA:  Flank pain, kidney stone suspected. Nausea and vomiting. Symptoms began 4 days after eating chocolate chip cookies. EXAM: CT ABDOMEN AND PELVIS WITHOUT CONTRAST TECHNIQUE: Multidetector CT imaging of the abdomen and pelvis was performed following the standard protocol without IV contrast. COMPARISON:  04/04/2015 FINDINGS: Lower chest: Lung bases are clear. Hepatobiliary: No focal liver abnormality is seen. No gallstones, gallbladder wall thickening, or biliary dilatation. Pancreas: Unremarkable. No pancreatic ductal dilatation or surrounding inflammatory changes. Spleen: Normal in size without focal abnormality. Adrenals/Urinary Tract: Adrenal glands are unremarkable. Kidneys are normal, without renal calculi, focal lesion, or hydronephrosis. Bladder is unremarkable. Stomach/Bowel: Stomach, small bowel, and colon are not abnormally distended. Small bowel are mostly decompressed, limiting evaluation of the wall but no specific inflammatory changes are identified. Residual contrast material or opaque medication in the colon. Surgical absence of the appendix. Vascular/Lymphatic: No significant vascular findings are present. No enlarged abdominal or pelvic lymph nodes. Reproductive: Prostate is unremarkable. Other: No free air or free fluid in the abdomen. Abdominal wall musculature appears intact. Musculoskeletal: No acute bony abnormalities. IMPRESSION: No renal or ureteral stone or obstruction. No evidence of bowel  obstruction or inflammation. Electronically Signed   By: Burman Nieves M.D.   On: 01/21/2021 21:23    Procedures Procedures   Medications Ordered in ED Medications  ondansetron (ZOFRAN) injection 4 mg (4 mg Intravenous Given 01/21/21 2258)  pantoprazole (PROTONIX) injection 40 mg (has no administration in time range)  lactated ringers infusion ( Intravenous Rate/Dose Change 01/22/21 0007)  HYDROmorphone (DILAUDID) injection 0.5 mg (0.5 mg Intravenous Given 01/22/21 0007)  morphine 2 MG/ML injection 2 mg (has no administration in time range)  HYDROmorphone (DILAUDID) injection 1 mg (has no administration in time range)  metoCLOPramide (REGLAN) injection 10 mg (10 mg Intravenous Given 01/21/21 2004)  lactated ringers bolus 1,000 mL (0 mLs Intravenous Stopped 01/21/21 2126)  capsicum (ZOSTRIX) 0.075 % cream (1 application Topical Given 01/21/21 2030)  pantoprazole (PROTONIX) injection 40 mg (40 mg Intravenous Given 01/21/21 2005)  HYDROmorphone (DILAUDID) injection 0.5 mg (0.5 mg Intravenous Given 01/21/21 2005)  lactated ringers bolus 500 mL (0 mLs Intravenous Stopped 01/21/21 2210)  HYDROmorphone (DILAUDID) injection 0.5 mg (0.5 mg Intravenous Given 01/21/21 2115)  HYDROmorphone (DILAUDID) injection 1 mg (1 mg Intravenous Given 01/22/21 0105)    ED Course  I have reviewed the triage vital signs and the nursing notes.  Pertinent labs & imaging results that were available during my care of the patient were reviewed by me and considered in my medical decision making (see chart for details).    MDM Rules/Calculators/A&P                         CRITICAL CARE Performed by: Gloris Manchester   Total critical care time: 35 minutes  Critical care time was exclusive of separately billable procedures and treating other patients.  Critical care was necessary to treat or prevent imminent or life-threatening deterioration.  Critical care was time spent personally by me on the following activities: development of treatment plan with patient and/or surrogate as well as  nursing, discussions with consultants, evaluation of patient's response to treatment, examination of patient, obtaining history from patient or surrogate, ordering and performing treatments and interventions, ordering and review of laboratory studies, ordering and review of radiographic studies, pulse oximetry and re-evaluation of patient's condition.   Patient is a 35 year old male presenting for severe nausea, vomiting, and p.o. intolerance over the past 5 days.  Patient is actively vomiting upon arrival in the ED.  He is acutely ill-appearing.  He is able to answer questions but history is limited by his severe discomfort.  Laboratory work-up was initiated.  Reglan and Dilaudid were given for symptomatic relief.  Bolus of IV fluids was started.  Patient's early lab work concerning for acute renal failure with hyponatremia.  BUN markedly elevated, consistent with prerenal etiology.  This is expected given his severe vomiting and complete p.o. intolerance for 5 days now.  Per chart review, patient had a similar presentation in January, which required hospitalization.  At that time, he had rhabdomyolysis.  CK ordered.  Given his described coffee-ground emesis, Protonix was ordered.  Given his history of marijuana use, capsaicin cream was ordered for continued symptomatic relief.  Following initial therapy, patient did have improved symptoms.  Vomiting did resolve.  He did request additional pain medications which were ordered.  CK was markedly elevated at greater than 10,000.  Patient also has a leukocytosis of 21.1, which I suspect is demargination from his severe vomiting.  Given his absence of any other infectious symptoms, no antibiotics indicated at  this time.  A CT stone study was ordered to identify any intra-abdominal pathology, including obstructive uropathy.  Results of CT scan were negative for any acute findings.  Edition IV fluids were ordered.  Patient was admitted for further management.  Final  Clinical Impression(s) / ED Diagnoses Final diagnoses:  AKI (acute kidney injury) (HCC)  Non-traumatic rhabdomyolysis  Nausea and vomiting in adult  Hyponatremia    Rx / DC Orders ED Discharge Orders     None        Gloris Manchester, MD 01/22/21 [phone]

## 2021-01-21 NOTE — ED Provider Notes (Signed)
Emergency Medicine Provider Triage Evaluation Note  Malakai Schoenherr , a 35 y.o. male  was evaluated in triage.  Pt complains of nausea, vomiting.  Prior history of cyclic vomiting, states symptoms began approximately 4 days ago after eating some chocolate chip cookies.  No oral intake for the last couple of days.  Review of Systems  Positive: Nausea, vomiting Negative: diarrhea  Physical Exam  BP 109/85 (BP Location: Right Arm)   Pulse 90   Resp 18   SpO2 93%  Gen:   Awake, no distress   Resp:  Normal effort  MSK:   Moves extremities without difficulty  Other:    Medical Decision Making  Medically screening exam initiated at 12:56 PM.  Appropriate orders placed.  Kapena Warrell was informed that the remainder of the evaluation will be completed by another provider, this initial triage assessment does not replace that evaluation, and the importance of remaining in the ED until their evaluation is complete.     Claude Manges, PA-C 01/21/21 1303    Mancel Bale, MD 01/21/21 [phone]

## 2021-01-21 NOTE — ED Notes (Signed)
Pt. I-stat Chem 8 result Sodium 117, MD, Dixon made aware and RN, Vernona Rieger.

## 2021-01-22 ENCOUNTER — Other Ambulatory Visit: Payer: Self-pay

## 2021-01-22 ENCOUNTER — Encounter (HOSPITAL_COMMUNITY): Payer: Self-pay | Admitting: Family Medicine

## 2021-01-22 DIAGNOSIS — N179 Acute kidney failure, unspecified: Secondary | ICD-10-CM

## 2021-01-22 DIAGNOSIS — E872 Acidosis: Secondary | ICD-10-CM

## 2021-01-22 DIAGNOSIS — E8729 Other acidosis: Secondary | ICD-10-CM

## 2021-01-22 DIAGNOSIS — K920 Hematemesis: Secondary | ICD-10-CM

## 2021-01-22 DIAGNOSIS — R112 Nausea with vomiting, unspecified: Secondary | ICD-10-CM

## 2021-01-22 LAB — BASIC METABOLIC PANEL
Anion gap: 15 (ref 5–15)
Anion gap: 13 (ref 5–15)
Anion gap: 16 — ABNORMAL HIGH (ref 5–15)
Anion gap: 8 (ref 5–15)
Anion gap: 11 (ref 5–15)
Anion gap: 12 (ref 5–15)
BUN: 106 mg/dL — ABNORMAL HIGH (ref 6–20)
BUN: 100 mg/dL — ABNORMAL HIGH (ref 6–20)
BUN: 111 mg/dL — ABNORMAL HIGH (ref 6–20)
BUN: 87 mg/dL — ABNORMAL HIGH (ref 6–20)
BUN: 88 mg/dL — ABNORMAL HIGH (ref 6–20)
BUN: 80 mg/dL — ABNORMAL HIGH (ref 6–20)
CO2: 24 mmol/L (ref 22–32)
CO2: 26 mmol/L (ref 22–32)
CO2: 27 mmol/L (ref 22–32)
CO2: 24 mmol/L (ref 22–32)
CO2: 29 mmol/L (ref 22–32)
CO2: 26 mmol/L (ref 22–32)
Calcium: 8.2 mg/dL — ABNORMAL LOW (ref 8.9–10.3)
Calcium: 7.8 mg/dL — ABNORMAL LOW (ref 8.9–10.3)
Calcium: 9.4 mg/dL (ref 8.9–10.3)
Calcium: 6.8 mg/dL — ABNORMAL LOW (ref 8.9–10.3)
Calcium: 9.1 mg/dL (ref 8.9–10.3)
Calcium: 9 mg/dL (ref 8.9–10.3)
Chloride: 89 mmol/L — ABNORMAL LOW (ref 98–111)
Chloride: 91 mmol/L — ABNORMAL LOW (ref 98–111)
Chloride: 80 mmol/L — ABNORMAL LOW (ref 98–111)
Chloride: 102 mmol/L (ref 98–111)
Chloride: 89 mmol/L — ABNORMAL LOW (ref 98–111)
Chloride: 90 mmol/L — ABNORMAL LOW (ref 98–111)
Creatinine, Ser: 7.16 mg/dL — ABNORMAL HIGH (ref 0.61–1.24)
Creatinine, Ser: 5.9 mg/dL — ABNORMAL HIGH (ref 0.61–1.24)
Creatinine, Ser: 6.43 mg/dL — ABNORMAL HIGH (ref 0.61–1.24)
Creatinine, Ser: 3.94 mg/dL — ABNORMAL HIGH (ref 0.61–1.24)
Creatinine, Ser: 3.59 mg/dL — ABNORMAL HIGH (ref 0.61–1.24)
Creatinine, Ser: 2.98 mg/dL — ABNORMAL HIGH (ref 0.61–1.24)
GFR, Estimated: 9 mL/min — ABNORMAL LOW (ref >60)
GFR, Estimated: 12 mL/min — ABNORMAL LOW (ref >60)
GFR, Estimated: 11 mL/min — ABNORMAL LOW (ref >60)
GFR, Estimated: 19 mL/min — ABNORMAL LOW (ref >60)
GFR, Estimated: 22 mL/min — ABNORMAL LOW (ref >60)
GFR, Estimated: 27 mL/min — ABNORMAL LOW (ref >60)
Glucose, Bld: 90 mg/dL (ref 70–99)
Glucose, Bld: 86 mg/dL (ref 70–99)
Glucose, Bld: 107 mg/dL — ABNORMAL HIGH (ref 70–99)
Glucose, Bld: 84 mg/dL (ref 70–99)
Glucose, Bld: 116 mg/dL — ABNORMAL HIGH (ref 70–99)
Glucose, Bld: 95 mg/dL (ref 70–99)
Potassium: 3.8 mmol/L (ref 3.5–5.1)
Potassium: 3.5 mmol/L (ref 3.5–5.1)
Potassium: 4 mmol/L (ref 3.5–5.1)
Potassium: 3.1 mmol/L — ABNORMAL LOW (ref 3.5–5.1)
Potassium: 4.4 mmol/L (ref 3.5–5.1)
Potassium: 4.7 mmol/L (ref 3.5–5.1)
Sodium: 128 mmol/L — ABNORMAL LOW (ref 135–145)
Sodium: 130 mmol/L — ABNORMAL LOW (ref 135–145)
Sodium: 123 mmol/L — ABNORMAL LOW (ref 135–145)
Sodium: 134 mmol/L — ABNORMAL LOW (ref 135–145)
Sodium: 129 mmol/L — ABNORMAL LOW (ref 135–145)
Sodium: 128 mmol/L — ABNORMAL LOW (ref 135–145)

## 2021-01-22 LAB — HIV ANTIBODY (ROUTINE TESTING W REFLEX): HIV Screen 4th Generation wRfx: NONREACTIVE

## 2021-01-22 LAB — CBC WITH DIFFERENTIAL/PLATELET
Abs Immature Granulocytes: 0.1 10*3/uL — ABNORMAL HIGH (ref 0.00–0.07)
Basophils Absolute: 0 10*3/uL (ref 0.0–0.1)
Basophils Relative: 0 %
Eosinophils Absolute: 0 10*3/uL (ref 0.0–0.5)
Eosinophils Relative: 0 %
HCT: 35.3 % — ABNORMAL LOW (ref 39.0–52.0)
Hemoglobin: 13.3 g/dL (ref 13.0–17.0)
Immature Granulocytes: 1 %
Lymphocytes Relative: 6 %
Lymphs Abs: 0.8 10*3/uL (ref 0.7–4.0)
MCH: 34.3 pg — ABNORMAL HIGH (ref 26.0–34.0)
MCHC: 37.7 g/dL — ABNORMAL HIGH (ref 30.0–36.0)
MCV: 91 fL (ref 80.0–100.0)
Monocytes Absolute: 1.4 10*3/uL — ABNORMAL HIGH (ref 0.1–1.0)
Monocytes Relative: 11 %
Neutro Abs: 10.7 10*3/uL — ABNORMAL HIGH (ref 1.7–7.7)
Neutrophils Relative %: 82 %
Platelets: 154 10*3/uL (ref 150–400)
RBC: 3.88 MIL/uL — ABNORMAL LOW (ref 4.22–5.81)
RDW: 11.4 % — ABNORMAL LOW (ref 11.5–15.5)
WBC: 13 10*3/uL — ABNORMAL HIGH (ref 4.0–10.5)
nRBC: 0 % (ref 0.0–0.2)

## 2021-01-22 LAB — HEPATIC FUNCTION PANEL
ALT: 58 U/L — ABNORMAL HIGH (ref 0–44)
AST: 176 U/L — ABNORMAL HIGH (ref 15–41)
Albumin: 4.6 g/dL (ref 3.5–5.0)
Alkaline Phosphatase: 66 U/L (ref 38–126)
Bilirubin, Direct: 0.3 mg/dL — ABNORMAL HIGH (ref 0.0–0.2)
Indirect Bilirubin: 0.8 mg/dL (ref 0.3–0.9)
Total Bilirubin: 1.1 mg/dL (ref 0.3–1.2)
Total Protein: 8.5 g/dL — ABNORMAL HIGH (ref 6.5–8.1)

## 2021-01-22 LAB — PHOSPHORUS: Phosphorus: 6.6 mg/dL — ABNORMAL HIGH (ref 2.5–4.6)

## 2021-01-22 LAB — MAGNESIUM: Magnesium: 2.8 mg/dL — ABNORMAL HIGH (ref 1.7–2.4)

## 2021-01-22 LAB — CK: Total CK: 10281 U/L — ABNORMAL HIGH (ref 49–397)

## 2021-01-22 MED ORDER — HYDROMORPHONE HCL 1 MG/ML IJ SOLN
1.0000 mg | INTRAMUSCULAR | Status: DC | PRN
Start: 2021-01-22 — End: 2021-01-22
  Administered 2021-01-22 (×2): 1 mg via INTRAVENOUS
  Filled 2021-01-22 (×2): qty 1

## 2021-01-22 MED ORDER — POTASSIUM CHLORIDE 10 MEQ/100ML IV SOLN
10.0000 meq | INTRAVENOUS | Status: AC
Start: 2021-01-22 — End: 2021-01-22
  Administered 2021-01-22 (×4): 10 meq via INTRAVENOUS
  Filled 2021-01-22 (×4): qty 100

## 2021-01-22 MED ORDER — MORPHINE SULFATE (PF) 2 MG/ML IV SOLN
2.0000 mg | INTRAVENOUS | Status: DC | PRN
  Administered 2021-01-22 (×2): 2 mg via INTRAVENOUS
  Filled 2021-01-22 (×3): qty 1

## 2021-01-22 MED ORDER — FENTANYL CITRATE PF 50 MCG/ML IJ SOSY
25.0000 ug | PREFILLED_SYRINGE | INTRAMUSCULAR | Status: DC | PRN
  Administered 2021-01-22 – 2021-01-25 (×23): 25 ug via INTRAVENOUS
  Filled 2021-01-22 (×23): qty 1

## 2021-01-22 MED ORDER — SODIUM CHLORIDE 0.9 % IV SOLN: INTRAVENOUS | Status: DC

## 2021-01-22 MED ORDER — SCOPOLAMINE 1 MG/3DAYS TD PT72
1.0000 | MEDICATED_PATCH | TRANSDERMAL | Status: DC
  Administered 2021-01-22 – 2021-01-25 (×2): 1.5 mg via TRANSDERMAL
  Filled 2021-01-22 (×2): qty 1

## 2021-01-22 MED ORDER — KCL IN DEXTROSE-NACL 20-5-0.45 MEQ/L-%-% IV SOLN
INTRAVENOUS | Status: DC
  Filled 2021-01-22: qty 1000

## 2021-01-22 MED ORDER — LACTATED RINGERS IV SOLN: INTRAVENOUS | Status: DC

## 2021-01-22 MED ORDER — HYDROMORPHONE HCL 1 MG/ML IJ SOLN
1.0000 mg | Freq: Once | INTRAMUSCULAR | Status: AC
  Administered 2021-01-22: 1 mg via INTRAVENOUS
  Filled 2021-01-22: qty 1

## 2021-01-22 MED ORDER — ORAL CARE MOUTH RINSE
15.0000 mL | Freq: Two times a day (BID) | OROMUCOSAL | Status: DC
  Administered 2021-01-24 – 2021-01-25 (×3): 15 mL via OROMUCOSAL

## 2021-01-22 MED ORDER — SODIUM CHLORIDE 0.9 % IV BOLUS
1000.0000 mL | Freq: Once | INTRAVENOUS | Status: AC
  Administered 2021-01-22: 1000 mL via INTRAVENOUS

## 2021-01-22 MED ORDER — PROCHLORPERAZINE EDISYLATE 10 MG/2ML IJ SOLN
10.0000 mg | Freq: Four times a day (QID) | INTRAMUSCULAR | Status: DC | PRN
  Administered 2021-01-22 – 2021-01-26 (×8): 10 mg via INTRAVENOUS
  Filled 2021-01-22 (×8): qty 2

## 2021-01-22 MED ORDER — CHLORHEXIDINE GLUCONATE 0.12 % MT SOLN
15.0000 mL | Freq: Two times a day (BID) | OROMUCOSAL | Status: DC
  Administered 2021-01-22 – 2021-01-25 (×3): 15 mL via OROMUCOSAL
  Filled 2021-01-22 (×5): qty 15

## 2021-01-22 NOTE — ED Notes (Signed)
Given 2 hot packs to help sooth discomfort.

## 2021-01-22 NOTE — ED Notes (Signed)
Patient rating pain at 9. DO has been notified. Patient found on all fours in room by this nurse, and was escorted back into bed.

## 2021-01-22 NOTE — ED Notes (Signed)
Pt is vomiting.  

## 2021-01-22 NOTE — ED Notes (Signed)
DO notified of continuing nausea.

## 2021-01-22 NOTE — ED Notes (Signed)
Fentanyl given, and Gastro MD [place] in room.

## 2021-01-22 NOTE — Progress Notes (Signed)
PROGRESS NOTE    Erik Cowan  IRW:[id number] DOB: [birth year] DOA: 01/21/2021 PCP: Patient, No Pcp Per (Inactive)   Brief Narrative:  The patient is a 35 year old African-American male with a past medical history significant for but not limited to cyclical vomiting syndrome, history of cannabis use, history of bipolar disorder type I, GERD as well as other comorbidities who presented with intractable nausea vomiting.  Patient reported that about 5 days ago he began to have persistent nausea vomiting and noticed that his vomitus has been brown with some blood-tinge.  He thought it was due to some cookies that he ate at that time.  Denies the cookies being laced with anything.  He started reporting diffuse abdominal pain but no fevers and denies any diarrhea.  He not been able to eat or drink for at least 5 days and unable to produce urine for the last 5 days.  Also noted to have low back pain and endorses tobacco and marijuana use though he states about 12 days ago since his last use.  Denies any alcohol.  Because of his intractable nausea and vomiting as well as abdominal pain he presented to the hospital.  He initially presented to [facility] and had a sodium of 123 but left and later arrived here less along with repeat sodium 117.  He was noted to have a K of 5.1 and a creatinine 11.8 no recent comparison for admission.  CT renal stone study was done was relatively unremarkable.  He was admitted for his intractable nausea vomiting likely in the setting of viral gastroenteritis versus hyperemesis syndrome in the setting of cannabis use.  He was placed on antiemetics with Zofran.  He had some hematemesis and noted some coffee-ground emesis but likely from Mallory-Weiss tear.  Given that his hemoglobin is stable he started on daily PPI.  His sodium was low on admission and is now improving with IV fluid hydration but worsened again along with his Renal Fxn. Will have GI evaluate for his intractable Nausea  and Vomiting and have Nephrology evaluate for his Renal Failure.   Assessment & Plan:   Principal Problem:   Hyponatremia Active Problems:   Bipolar 1 disorder (HCC)   Intractable nausea and vomiting   Acute renal failure (ARF) (HCC)   Metabolic acidosis, increased anion gap (IAG)  Intractable nausea and vomiting and abdominal pain -Admitted to the progressive care unit as inpatient -Currently unclear etiology that this could be viral gastroenteritis versus aberrant emesis cannabis syndrome; he does have a history of cyclical vomiting syndrome -UDS was checked and was positive for THC -Continue with supportive care and IV fluid hydration; he received a 1.5 L LR bolus and a 1 Liter NS bolus was placed on continuous maintenance fluid with LR at 50 cc/h but will change to NS at 75 mL/hr -Continue with antiemetics with IV Zofran 4 mg every 6 as needed for nausea vomiting; also received metoclopramide 10 mg IV x1 -Patient was given capsaicin cream on the abdomen topically 0.075% -Currently getting IV hydromorphone 1 mg every 3 as needed severe pain for abdominal pain and IV morphine 2 mg IV every 3 as needed for moderate pain -Will get GI involved for his Intractable Nausea and Vomiting   Rhabdomyolysis -Unclear etiology but he presented and had a CK of 10,864 and mildly improved to 10,281 -Continue with IV fluid hydration but will increase to 100 MLS per hour and given an additional 1 Liter NS Bolus  -Continue monitor and trend  and repeat CK  Hematemesis -Patient presented with coffee-ground emesis -Possibly from Mallory-Weiss tear from intractable nausea vomiting -He has actually having an erythrocytosis and now hemoglobin/hematocrit is trended down and went from 17.1/45.9 -> 15.5/41.2 -> 15.0/44.0 -> 13.3/35.3 -Check anemia panel in the a.m. if he continues to drop further; repeat CBC still -Continue to monitor for signs and symptoms of bleeding; -Will get GI Involved for his  Hematemesis   Hyponatremia -Acute -In the setting of hypovolemia from GI losses -Continue monitor sodium every 4 hours -Had a sodium of 123 and then went up to 1.4 and then dropped to 117; now improving from 117 and last check was 130 but dropped again to 123 -Continue to monitor and trend carefully and repeat CMP in a.m. and continue to monitor sodiums every 4 -IVF hydration as above  Abnormal LFTs -In the setting of rhabdomyolysis -On presentation his AST was 138, ALT was 42 -Now AST is worsened to 176 and ALT is 58  -Continue monitor trend hepatic function carefully and will obtain a right upper quadrant ultrasound as well as a acute hepatitis panel in the AM  -Repeat CMP in the a.m.  Leukocytosis, improving  -Likely reactive in the setting of his nausea vomiting and poor p.o. intake given that he is likely hemoconcentrated and dehydrated -WBC on admission was 20.9 and repeat was 21.1 and is now 13.0 -Currently repeat is pending this morning -Continue monitor for signs and symptoms of infection; -Repeat CBC in the AM   Acute renal failure Elevated anion gap  Hyperphosphatemia  -Suspect both prerenal and ATN possibly; has not had really any urine output reported for 5 days -Checking bladder scan, UA, urine sodium and creatinine -Urinalysis showed a cloudy appearance with large hemoglobin, few bacteria 21-50 RBCs per high-power field and 6-10 WBCs; urine sodium was 26, urine creatinine was 117.99 -His CT renal stone study done and there is no hydronephrosis noted or obstruction -Patient presented with a BUNs/creatinine of 117/11.42 and BUN/Cr trended dow to 100/5.90 and is now 111/6.43 -Patient also presented with an elevated anion gap of 33, CO2 of 23, chloride level of 67 -BUN/creatinine was improving with IV fluid hydration as he received 1.5 L of LR and 1 Liter NS bolus and will be getting maintenance IV fluids at 100 mL/hr -Avoid further nephrotoxic medication, contrast dyes,  hypotension and renally adjust medications -Currently no urgent indications for dialysis so we will hold off nephrology consultation -Patient's BUNs/creatinine is improving and will need to continue monitor renal function carefully and repeat BMPs every 4 and CMP in a.m.  Hx of bipolar disorder type I -Patient denies taking any prescription medication   GERD -Continue with IV PPI pantoprazole 40 mg daily at bedtime  DVT prophylaxis: SCDs Code Status: FULL CODE Family Communication: No family present at bedside Disposition Plan: Pending further clinical improvement and improvement in his renal function back to baseline  Status is: Inpatient  Remains inpatient appropriate because:Unsafe d/c plan, IV treatments appropriate due to intensity of illness or inability to take PO, and Inpatient level of care appropriate due to severity of illness  Dispo: The patient is from: Home              Anticipated d/c is to: Home              Patient currently is not medically stable to d/c.   Difficult to place patient No  Consultants:  Gastroenterology Nephrology   Procedures: None  Antimicrobials: Anti-infectives (From  admission, onward)    None        Subjective: Seen and examined at bedside and is still very nauseous and is vomiting early this morning.  Was having the hiccups.  Did not feel very well and states that he still throwing up some blood.  No chest pain or shortness of breath.  No other concerns or complaints at this time but appears uncomfortable.  Objective: Vitals:   01/22/21 0400 01/22/21 0430 01/22/21 0700 01/22/21 0832  BP: (!) 150/102 (!) 144/100 (!) 140/108 (!) 146/105  Pulse: 64 66 86 86  Resp: 15 (!) 21 20 18   Temp:      TempSrc:      SpO2: 99% 96% 92% 100%  Weight:      Height:        Intake/Output Summary (Last 24 hours) at 01/22/2021 0855 Last data filed at 01/21/2021 2245 Gross per 24 hour  Intake 500 ml  Output 700 ml  Net -200 ml   Filed Weights    01/21/21 1944 01/21/21 1950  Weight: 70.3 kg 70.3 kg   Examination: Physical Exam:  Constitutional: African-American male currently in mild distress appears uncomfortable and nauseous Eyes: Lids and conjunctivae normal, sclerae anicteric  ENMT: External Ears, Nose appear normal. Grossly normal hearing.  Neck: Appears normal, supple, no cervical masses, normal ROM, no appreciable thyromegaly; no JVD Respiratory: Diminished to auscultation bilaterally with coarse breath sounds, no wheezing, rales, rhonchi or crackles. Normal respiratory effort and patient is not tachypenic. No accessory muscle use. Unlabored breathing  Cardiovascular: Slightly bradycardic, no murmurs / rubs / gallops. S1 and S2 auscultated. No extremity edema.  Abdomen: Soft, tender, non-distended. Bowel sounds positive.  GU: Deferred. Musculoskeletal: No clubbing / cyanosis of digits/nails. No joint deformity upper and lower extremities.  Skin: No rashes, lesions, ulcers. No induration; Warm and dry.  Neurologic: CN 2-12 grossly intact with no focal deficits. Romberg sign and cerebellar reflexes not assessed.  Psychiatric: Normal judgment and insight. Alert and oriented x 3. Normal mood and appropriate affect.   Data Reviewed: I have personally reviewed following labs and imaging studies  CBC: Recent Labs  Lab 01/21/21 1321 01/21/21 1955 01/21/21 2041  WBC 20.9* 21.1*  --   NEUTROABS 18.3* 18.3*  --   HGB 17.1* 15.5 15.0  HCT 45.9 41.2 44.0  MCV 91.6 90.4  --   PLT 199 193  --    Basic Metabolic Panel: Recent Labs  Lab 01/21/21 1321 01/21/21 1955 01/21/21 2041 01/21/21 2212 01/22/21 0254 01/22/21 0638  NA 123* 124* 117* 125* 128* 130*  K 5.1 4.6 5.1 5.0 3.8 3.5  CL 67* 71* 78* 74* 89* 91*  CO2 23 22  --  26 24 26   GLUCOSE 126* 112* 115* 103* 90 86  BUN 117* 123* 126* 120* 106* 100*  CREATININE 11.42* 10.09* 11.80* 9.26* 7.16* 5.90*  CALCIUM 11.3* 9.4  --  9.8 8.2* 7.8*  MG  --  2.7*  --   --   --    --    GFR: Estimated Creatinine Clearance: 17.4 mL/min (A) (by C-G formula based on SCr of 5.9 mg/dL (H)). Liver Function Tests: Recent Labs  Lab 01/21/21 1321 01/21/21 1955  AST 138* 143*  ALT 42 46*  ALKPHOS 85 75  BILITOT 0.9 1.1  PROT 10.5* 9.4*  ALBUMIN 5.6* 5.0   Recent Labs  Lab 01/21/21 1321 01/21/21 1955  LIPASE 26 26   No results for input(s): AMMONIA in the last 168  hours. Coagulation Profile: No results for input(s): INR, PROTIME in the last 168 hours. Cardiac Enzymes: Recent Labs  Lab 01/21/21 1955  CKTOTAL 10,864*   BNP (last 3 results) No results for input(s): PROBNP in the last 8760 hours. HbA1C: No results for input(s): HGBA1C in the last 72 hours. CBG: No results for input(s): GLUCAP in the last 168 hours. Lipid Profile: No results for input(s): CHOL, HDL, LDLCALC, TRIG, CHOLHDL, LDLDIRECT in the last 72 hours. Thyroid Function Tests: No results for input(s): TSH, T4TOTAL, FREET4, T3FREE, THYROIDAB in the last 72 hours. Anemia Panel: No results for input(s): VITAMINB12, FOLATE, FERRITIN, TIBC, IRON, RETICCTPCT in the last 72 hours. Sepsis Labs: No results for input(s): PROCALCITON, LATICACIDVEN in the last 168 hours.  Recent Results (from the past 240 hour(s))  Resp Panel by RT-PCR (Flu A&B, Covid) Nasopharyngeal Swab     Status: None   Collection Time: 01/21/21 10:12 PM   Specimen: Nasopharyngeal Swab; Nasopharyngeal(NP) swabs in vial transport medium  Result Value Ref Range Status   SARS Coronavirus 2 by RT PCR NEGATIVE NEGATIVE Final    Comment: (NOTE) SARS-CoV-2 target nucleic acids are NOT DETECTED.  The SARS-CoV-2 RNA is generally detectable in upper respiratory specimens during the acute phase of infection. The lowest concentration of SARS-CoV-2 viral copies this assay can detect is 138 copies/mL. A negative result does not preclude SARS-Cov-2 infection and should not be used as the sole basis for treatment or other patient  management decisions. A negative result may occur with  improper specimen collection/handling, submission of specimen other than nasopharyngeal swab, presence of viral mutation(s) within the areas targeted by this assay, and inadequate number of viral copies(<138 copies/mL). A negative result must be combined with clinical observations, patient history, and epidemiological information. The expected result is Negative.  Fact Sheet for Patients:  BloggerCourse.com  Fact Sheet for Healthcare Providers:  SeriousBroker.it  This test is no t yet approved or cleared by the [place] FDA and  has been authorized for detection and/or diagnosis of SARS-CoV-2 by FDA under an Emergency Use Authorization (EUA). This EUA will remain  in effect (meaning this test can be used) for the duration of the COVID-19 declaration under Section 564(b)(1) of the Act, 21 U.S.C.section 360bbb-3(b)(1), unless the authorization is terminated  or revoked sooner.       Influenza A by PCR NEGATIVE NEGATIVE Final   Influenza B by PCR NEGATIVE NEGATIVE Final    Comment: (NOTE) The Xpert Xpress SARS-CoV-2/FLU/RSV plus assay is intended as an aid in the diagnosis of influenza from Nasopharyngeal swab specimens and should not be used as a sole basis for treatment. Nasal washings and aspirates are unacceptable for Xpert Xpress SARS-CoV-2/FLU/RSV testing.  Fact Sheet for Patients: BloggerCourse.com  Fact Sheet for Healthcare Providers: SeriousBroker.it  This test is not yet approved or cleared by the [place] FDA and has been authorized for detection and/or diagnosis of SARS-CoV-2 by FDA under an Emergency Use Authorization (EUA). This EUA will remain in effect (meaning this test can be used) for the duration of the COVID-19 declaration under Section 564(b)(1) of the Act, 21 U.S.C. section 360bbb-3(b)(1),  unless the authorization is terminated or revoked.  Performed at [facility], [address], [place], [place] [address]     RN Pressure Injury Documentation:     Estimated body mass index is 19.9 kg/m as calculated from the following:   Height as of this encounter: 6\' 2"  (1.88 m).   Weight as of  this encounter: 70.3 kg.  Malnutrition Type:   Malnutrition Characteristics:   Nutrition Interventions:   Radiology Studies: DG Chest Portable 1 View  Result Date: 01/21/2021 CLINICAL DATA:  Vomiting EXAM: PORTABLE CHEST 1 VIEW COMPARISON:  12/30/2015 FINDINGS: The heart size and mediastinal contours are within normal limits. Both lungs are clear. The visualized skeletal structures are unremarkable. IMPRESSION: Normal study. Electronically Signed   By: Charlett Nose M.D.   On: 01/21/2021 20:14   CT Renal Stone Study  Result Date: 01/21/2021 CLINICAL DATA:  Flank pain, kidney stone suspected. Nausea and vomiting. Symptoms began 4 days after eating chocolate chip cookies. EXAM: CT ABDOMEN AND PELVIS WITHOUT CONTRAST TECHNIQUE: Multidetector CT imaging of the abdomen and pelvis was performed following the standard protocol without IV contrast. COMPARISON:  04/04/2015 FINDINGS: Lower chest: Lung bases are clear. Hepatobiliary: No focal liver abnormality is seen. No gallstones, gallbladder wall thickening, or biliary dilatation. Pancreas: Unremarkable. No pancreatic ductal dilatation or surrounding inflammatory changes. Spleen: Normal in size without focal abnormality. Adrenals/Urinary Tract: Adrenal glands are unremarkable. Kidneys are normal, without renal calculi, focal lesion, or hydronephrosis. Bladder is unremarkable. Stomach/Bowel: Stomach, small bowel, and colon are not abnormally distended. Small bowel are mostly decompressed, limiting evaluation of the wall but no specific inflammatory changes are identified. Residual contrast material or opaque medication in the colon.  Surgical absence of the appendix. Vascular/Lymphatic: No significant vascular findings are present. No enlarged abdominal or pelvic lymph nodes. Reproductive: Prostate is unremarkable. Other: No free air or free fluid in the abdomen. Abdominal wall musculature appears intact. Musculoskeletal: No acute bony abnormalities. IMPRESSION: No renal or ureteral stone or obstruction. No evidence of bowel obstruction or inflammation. Electronically Signed   By: Burman Nieves M.D.   On: 01/21/2021 21:23    Scheduled Meds:  pantoprazole (PROTONIX) IV  40 mg Intravenous QHS   Continuous Infusions:  lactated ringers 50 mL/hr at 01/22/21 0007    LOS: 1 day   Merlene Laughter, DO [facility] PAGER is on AMION  If 7PM-7AM, please contact night-coverage [email or web address]

## 2021-01-22 NOTE — ED Notes (Addendum)
Breakfast tray provided. 

## 2021-01-22 NOTE — ED Notes (Signed)
Nausea has been reduced to tolerable levels after admin of compazine.

## 2021-01-22 NOTE — Consult Note (Signed)
[facility] UNASSIGNED CONSULT  Reason for Consult: Nausea and vomiting Referring Physician: [facility]  Erik Cowan HPI: This is a 35 year old male with a PMH of recurrent vomiting, GERD, and Bipolar disorder who is admitted for complaints of nausea and vomiting. His symptoms started last week and it progressively worsened.  At home last evening he was vomiting a significant amount and he did report having hematemesis.  In the past he was evaluated by Dr. Arlyce Dice in 2013 and over at [facility] in 2016 for his symptoms.  On a baseline he suffers with chronic nausea, vomiting, and abdominal pain, but his symptoms will acutely flare.  He thought that his symptoms were secondary to eating some cookies.  On a routine he smokes marijuana and he started smoking since the age of 53.   On average, for the past 1.5 years, he increased his marijuana usage to 20 per day and his last use was last Thursday.  In the ER he was noted to be hyponatremic was a sodium of 117 and his creatinine was at 11.8 with a BUN of 126.  With IV hydration his creatinine is declining as well as his BUN.  Taking a hot shower once per day will help to alleviate.  Past Medical History:  Diagnosis Date   Bipolar 1 disorder (HCC)    Bipolar disorder (HCC)    Cyclic vomiting syndrome    GERD (gastroesophageal reflux disease)    Nephrolithiasis    Schizophrenia, acute (HCC)     Past Surgical History:  Procedure Laterality Date   APPENDECTOMY     unremarkable      Family History  Problem Relation Age of Onset   Liver cancer Mother    Colon cancer Maternal Uncle    Diabetes Maternal Aunt    Heart disease Paternal Grandfather    Testicular cancer Brother     Social History:  reports that he has been smoking cigarettes. He has never used smokeless tobacco. He reports current alcohol use. He reports current drug use. Frequency: 7.00 times per week. Drug: Marijuana.  Allergies:  Allergies  Allergen Reactions   Gluten  Meal Nausea And Vomiting    Stomach pains   Milk-Related Compounds Nausea And Vomiting    Stomach upset   Latex Rash    Medications: Scheduled:  pantoprazole (PROTONIX) IV  40 mg Intravenous QHS   Continuous:  sodium chloride 100 mL/hr at 01/22/21 1503    Results for orders placed or performed during the hospital encounter of 01/21/21 (from the past 24 hour(s))  Comprehensive metabolic panel     Status: Abnormal   Collection Time: 01/21/21  7:55 PM  Result Value Ref Range   Sodium 124 (L) 135 - 145 mmol/L   Potassium 4.6 3.5 - 5.1 mmol/L   Chloride 71 (L) 98 - 111 mmol/L   CO2 22 22 - 32 mmol/L   Glucose, Bld 112 (H) 70 - 99 mg/dL   BUN 098 (H) 6 - 20 mg/dL   Creatinine, Ser 11.91 (H) 0.61 - 1.24 mg/dL   Calcium 9.4 8.9 - 47.8 mg/dL   Total Protein 9.4 (H) 6.5 - 8.1 g/dL   Albumin 5.0 3.5 - 5.0 g/dL   AST 295 (H) 15 - 41 U/L   ALT 46 (H) 0 - 44 U/L   Alkaline Phosphatase 75 38 - 126 U/L   Total Bilirubin 1.1 0.3 - 1.2 mg/dL   GFR, Estimated 6 (L) >60 mL/min   Anion gap 31 (H)  5 - 15  Lipase, blood     Status: None   Collection Time: 01/21/21  7:55 PM  Result Value Ref Range   Lipase 26 11 - 51 U/L  CBC with Diff     Status: Abnormal   Collection Time: 01/21/21  7:55 PM  Result Value Ref Range   WBC 21.1 (H) 4.0 - 10.5 K/uL   RBC 4.56 4.22 - 5.81 MIL/uL   Hemoglobin 15.5 13.0 - 17.0 g/dL   HCT 16.0 73.7 - 10.6 %   MCV 90.4 80.0 - 100.0 fL   MCH 34.0 26.0 - 34.0 pg   MCHC 37.6 (H) 30.0 - 36.0 g/dL   RDW 26.9 (L) 48.5 - 46.2 %   Platelets 193 150 - 400 K/uL   nRBC 0.0 0.0 - 0.2 %   Neutrophils Relative % 87 %   Neutro Abs 18.3 (H) 1.7 - 7.7 K/uL   Lymphocytes Relative 4 %   Lymphs Abs 0.9 0.7 - 4.0 K/uL   Monocytes Relative 8 %   Monocytes Absolute 1.6 (H) 0.1 - 1.0 K/uL   Eosinophils Relative 0 %   Eosinophils Absolute 0.0 0.0 - 0.5 K/uL   Basophils Relative 0 %   Basophils Absolute 0.0 0.0 - 0.1 K/uL   Immature Granulocytes 1 %   Abs Immature Granulocytes  0.16 (H) 0.00 - 0.07 K/uL  Magnesium     Status: Abnormal   Collection Time: 01/21/21  7:55 PM  Result Value Ref Range   Magnesium 2.7 (H) 1.7 - 2.4 mg/dL  CK     Status: Abnormal   Collection Time: 01/21/21  7:55 PM  Result Value Ref Range   Total CK 10,864 (H) 49 - 397 U/L  I-Stat Chem 8, ED     Status: Abnormal   Collection Time: 01/21/21  8:41 PM  Result Value Ref Range   Sodium 117 (LL) 135 - 145 mmol/L   Potassium 5.1 3.5 - 5.1 mmol/L   Chloride 78 (L) 98 - 111 mmol/L   BUN 126 (H) 6 - 20 mg/dL   Creatinine, Ser 70.35 (H) 0.61 - 1.24 mg/dL   Glucose, Bld 009 (H) 70 - 99 mg/dL   Calcium, Ion 3.81 (L) 1.15 - 1.40 mmol/L   TCO2 25 22 - 32 mmol/L   Hemoglobin 15.0 13.0 - 17.0 g/dL   HCT 82.9 93.7 - 16.9 %   Comment NOTIFIED PHYSICIAN   Resp Panel by RT-PCR (Flu A&B, Covid) Nasopharyngeal Swab     Status: None   Collection Time: 01/21/21 10:12 PM   Specimen: Nasopharyngeal Swab; Nasopharyngeal(NP) swabs in vial transport medium  Result Value Ref Range   SARS Coronavirus 2 by RT PCR NEGATIVE NEGATIVE   Influenza A by PCR NEGATIVE NEGATIVE   Influenza B by PCR NEGATIVE NEGATIVE  HIV Antibody (routine testing w rflx)     Status: None   Collection Time: 01/21/21 10:12 PM  Result Value Ref Range   HIV Screen 4th Generation wRfx Non Reactive Non Reactive  Basic metabolic panel     Status: Abnormal   Collection Time: 01/21/21 10:12 PM  Result Value Ref Range   Sodium 125 (L) 135 - 145 mmol/L   Potassium 5.0 3.5 - 5.1 mmol/L   Chloride 74 (L) 98 - 111 mmol/L   CO2 26 22 - 32 mmol/L   Glucose, Bld 103 (H) 70 - 99 mg/dL   BUN 678 (H) 6 - 20 mg/dL   Creatinine, Ser 9.38 (H) 0.61 - 1.24  mg/dL   Calcium 9.8 8.9 - 23.5 mg/dL   GFR, Estimated 7 (L) >60 mL/min   Anion gap 25 (H) 5 - 15  Urinalysis, Routine w reflex microscopic Urine, Clean Catch     Status: Abnormal   Collection Time: 01/21/21 10:39 PM  Result Value Ref Range   Color, Urine YELLOW YELLOW   APPearance CLOUDY (A)  CLEAR   Specific Gravity, Urine 1.014 1.005 - 1.030   pH 5.0 5.0 - 8.0   Glucose, UA NEGATIVE NEGATIVE mg/dL   Hgb urine dipstick LARGE (A) NEGATIVE   Bilirubin Urine NEGATIVE NEGATIVE   Ketones, ur NEGATIVE NEGATIVE mg/dL   Protein, ur 573 (A) NEGATIVE mg/dL   Nitrite NEGATIVE NEGATIVE   Leukocytes,Ua NEGATIVE NEGATIVE   RBC / HPF 21-50 0 - 5 RBC/hpf   WBC, UA 6-10 0 - 5 WBC/hpf   Bacteria, UA FEW (A) NONE SEEN   Squamous Epithelial / LPF 0-5 0 - 5   Mucus PRESENT    Hyaline Casts, UA PRESENT    Amorphous Crystal PRESENT   Urine rapid drug screen (hosp performed)     Status: Abnormal   Collection Time: 01/21/21 10:39 PM  Result Value Ref Range   Opiates NONE DETECTED NONE DETECTED   Cocaine NONE DETECTED NONE DETECTED   Benzodiazepines NONE DETECTED NONE DETECTED   Amphetamines NONE DETECTED NONE DETECTED   Tetrahydrocannabinol POSITIVE (A) NONE DETECTED   Barbiturates NONE DETECTED NONE DETECTED  Sodium, urine, random     Status: None   Collection Time: 01/21/21 10:39 PM  Result Value Ref Range   Sodium, Ur 26 mmol/L  Creatinine, urine, random     Status: None   Collection Time: 01/21/21 10:39 PM  Result Value Ref Range   Creatinine, Urine 117.99 mg/dL  Basic metabolic panel     Status: Abnormal   Collection Time: 01/22/21  2:54 AM  Result Value Ref Range   Sodium 128 (L) 135 - 145 mmol/L   Potassium 3.8 3.5 - 5.1 mmol/L   Chloride 89 (L) 98 - 111 mmol/L   CO2 24 22 - 32 mmol/L   Glucose, Bld 90 70 - 99 mg/dL   BUN 220 (H) 6 - 20 mg/dL   Creatinine, Ser 2.54 (H) 0.61 - 1.24 mg/dL   Calcium 8.2 (L) 8.9 - 10.3 mg/dL   GFR, Estimated 9 (L) >60 mL/min   Anion gap 15 5 - 15  Basic metabolic panel     Status: Abnormal   Collection Time: 01/22/21  6:38 AM  Result Value Ref Range   Sodium 130 (L) 135 - 145 mmol/L   Potassium 3.5 3.5 - 5.1 mmol/L   Chloride 91 (L) 98 - 111 mmol/L   CO2 26 22 - 32 mmol/L   Glucose, Bld 86 70 - 99 mg/dL   BUN 270 (H) 6 - 20 mg/dL    Creatinine, Ser 6.23 (H) 0.61 - 1.24 mg/dL   Calcium 7.8 (L) 8.9 - 10.3 mg/dL   GFR, Estimated 12 (L) >60 mL/min   Anion gap 13 5 - 15  Basic metabolic panel     Status: Abnormal   Collection Time: 01/22/21  9:05 AM  Result Value Ref Range   Sodium 123 (L) 135 - 145 mmol/L   Potassium 4.0 3.5 - 5.1 mmol/L   Chloride 80 (L) 98 - 111 mmol/L   CO2 27 22 - 32 mmol/L   Glucose, Bld 107 (H) 70 - 99 mg/dL   BUN 762 (H) 6 -  20 mg/dL   Creatinine, Ser 3.90 (H) 0.61 - 1.24 mg/dL   Calcium 9.4 8.9 - 30.0 mg/dL   GFR, Estimated 11 (L) >60 mL/min   Anion gap 16 (H) 5 - 15  CBC with Differential/Platelet     Status: Abnormal   Collection Time: 01/22/21  9:05 AM  Result Value Ref Range   WBC 13.0 (H) 4.0 - 10.5 K/uL   RBC 3.88 (L) 4.22 - 5.81 MIL/uL   Hemoglobin 13.3 13.0 - 17.0 g/dL   HCT 92.3 (L) 30.0 - 76.2 %   MCV 91.0 80.0 - 100.0 fL   MCH 34.3 (H) 26.0 - 34.0 pg   MCHC 37.7 (H) 30.0 - 36.0 g/dL   RDW 26.3 (L) 33.5 - 45.6 %   Platelets 154 150 - 400 K/uL   nRBC 0.0 0.0 - 0.2 %   Neutrophils Relative % 82 %   Neutro Abs 10.7 (H) 1.7 - 7.7 K/uL   Lymphocytes Relative 6 %   Lymphs Abs 0.8 0.7 - 4.0 K/uL   Monocytes Relative 11 %   Monocytes Absolute 1.4 (H) 0.1 - 1.0 K/uL   Eosinophils Relative 0 %   Eosinophils Absolute 0.0 0.0 - 0.5 K/uL   Basophils Relative 0 %   Basophils Absolute 0.0 0.0 - 0.1 K/uL   Immature Granulocytes 1 %   Abs Immature Granulocytes 0.10 (H) 0.00 - 0.07 K/uL  Hepatic function panel     Status: Abnormal   Collection Time: 01/22/21  9:05 AM  Result Value Ref Range   Total Protein 8.5 (H) 6.5 - 8.1 g/dL   Albumin 4.6 3.5 - 5.0 g/dL   AST 256 (H) 15 - 41 U/L   ALT 58 (H) 0 - 44 U/L   Alkaline Phosphatase 66 38 - 126 U/L   Total Bilirubin 1.1 0.3 - 1.2 mg/dL   Bilirubin, Direct 0.3 (H) 0.0 - 0.2 mg/dL   Indirect Bilirubin 0.8 0.3 - 0.9 mg/dL  Magnesium     Status: Abnormal   Collection Time: 01/22/21  9:05 AM  Result Value Ref Range   Magnesium 2.8 (H)  1.7 - 2.4 mg/dL  Phosphorus     Status: Abnormal   Collection Time: 01/22/21  9:05 AM  Result Value Ref Range   Phosphorus 6.6 (H) 2.5 - 4.6 mg/dL  CK     Status: Abnormal   Collection Time: 01/22/21  9:05 AM  Result Value Ref Range   Total CK 10,281 (H) 49 - 397 U/L     DG Chest Portable 1 View  Result Date: 01/21/2021 CLINICAL DATA:  Vomiting EXAM: PORTABLE CHEST 1 VIEW COMPARISON:  12/30/2015 FINDINGS: The heart size and mediastinal contours are within normal limits. Both lungs are clear. The visualized skeletal structures are unremarkable. IMPRESSION: Normal study. Electronically Signed   By: Charlett Nose M.D.   On: 01/21/2021 20:14   CT Renal Stone Study  Result Date: 01/21/2021 CLINICAL DATA:  Flank pain, kidney stone suspected. Nausea and vomiting. Symptoms began 4 days after eating chocolate chip cookies. EXAM: CT ABDOMEN AND PELVIS WITHOUT CONTRAST TECHNIQUE: Multidetector CT imaging of the abdomen and pelvis was performed following the standard protocol without IV contrast. COMPARISON:  04/04/2015 FINDINGS: Lower chest: Lung bases are clear. Hepatobiliary: No focal liver abnormality is seen. No gallstones, gallbladder wall thickening, or biliary dilatation. Pancreas: Unremarkable. No pancreatic ductal dilatation or surrounding inflammatory changes. Spleen: Normal in size without focal abnormality. Adrenals/Urinary Tract: Adrenal glands are unremarkable. Kidneys are normal, without renal  calculi, focal lesion, or hydronephrosis. Bladder is unremarkable. Stomach/Bowel: Stomach, small bowel, and colon are not abnormally distended. Small bowel are mostly decompressed, limiting evaluation of the wall but no specific inflammatory changes are identified. Residual contrast material or opaque medication in the colon. Surgical absence of the appendix. Vascular/Lymphatic: No significant vascular findings are present. No enlarged abdominal or pelvic lymph nodes. Reproductive: Prostate is unremarkable.  Other: No free air or free fluid in the abdomen. Abdominal wall musculature appears intact. Musculoskeletal: No acute bony abnormalities. IMPRESSION: No renal or ureteral stone or obstruction. No evidence of bowel obstruction or inflammation. Electronically Signed   By: Burman NievesWilliam  Stevens M.D.   On: 01/21/2021 21:23    ROS:  As stated above in the HPI otherwise negative.  Blood pressure (!) 157/97, pulse 61, temperature 97.9 F (36.6 C), temperature source Oral, resp. rate (!) 22, height 6\' 2"  (1.88 m), weight 70.3 kg, SpO2 95 %.    PE: Gen: Uncomfortable, Alert and Oriented HEENT:  [place]/AT, EOMI Neck: Supple, no LAD Lungs: CTA Bilaterally CV: RRR without M/G/R ABD: Soft, abdominal pain, +BS Ext: No C/C/E  Assessment/Plan: 1) Cannabinoid Hyperemesis Syndrome. 2) Abdominal pain. 3) Renal insufficiency - improving.   The patient has CHS.  He has a long history of heavy marijuana use.  Taking a hot shower consistently provides him relieve, which is consistent with CHS.  With IV hydration his renal function is increasing.  There is a questionable history of hematemsis, but HGB did not significantly drop with the IV hydration.  Plan: 1) IV hydration. 2) Continue with supportive care. 3) The patient was encouraged to give a trial of being abstinent from marijuana.  Sonam Huelsmann D 01/22/2021, 1:56 PM

## 2021-01-22 NOTE — ED Notes (Signed)
Patient still having severe nausea.

## 2021-01-22 NOTE — ED Notes (Signed)
Pt ambulatory to bathroom with minimal assistance  

## 2021-01-23 DIAGNOSIS — D649 Anemia, unspecified: Secondary | ICD-10-CM

## 2021-01-23 DIAGNOSIS — R945 Abnormal results of liver function studies: Secondary | ICD-10-CM

## 2021-01-23 DIAGNOSIS — M6282 Rhabdomyolysis: Secondary | ICD-10-CM

## 2021-01-23 LAB — COMPREHENSIVE METABOLIC PANEL
ALT: 67 U/L — ABNORMAL HIGH (ref 0–44)
AST: 200 U/L — ABNORMAL HIGH (ref 15–41)
Albumin: 4.4 g/dL (ref 3.5–5.0)
Alkaline Phosphatase: 58 U/L (ref 38–126)
Anion gap: 10 (ref 5–15)
BUN: 70 mg/dL — ABNORMAL HIGH (ref 6–20)
CO2: 27 mmol/L (ref 22–32)
Calcium: 9.2 mg/dL (ref 8.9–10.3)
Chloride: 93 mmol/L — ABNORMAL LOW (ref 98–111)
Creatinine, Ser: 2.52 mg/dL — ABNORMAL HIGH (ref 0.61–1.24)
GFR, Estimated: 33 mL/min — ABNORMAL LOW (ref >60)
Glucose, Bld: 101 mg/dL — ABNORMAL HIGH (ref 70–99)
Potassium: 4.7 mmol/L (ref 3.5–5.1)
Sodium: 130 mmol/L — ABNORMAL LOW (ref 135–145)
Total Bilirubin: 1.3 mg/dL — ABNORMAL HIGH (ref 0.3–1.2)
Total Protein: 7.6 g/dL (ref 6.5–8.1)

## 2021-01-23 LAB — CBC WITH DIFFERENTIAL/PLATELET
Abs Immature Granulocytes: 0.02 10*3/uL (ref 0.00–0.07)
Basophils Absolute: 0 10*3/uL (ref 0.0–0.1)
Basophils Relative: 0 %
Eosinophils Absolute: 0 10*3/uL (ref 0.0–0.5)
Eosinophils Relative: 0 %
HCT: 32.2 % — ABNORMAL LOW (ref 39.0–52.0)
Hemoglobin: 11.9 g/dL — ABNORMAL LOW (ref 13.0–17.0)
Immature Granulocytes: 0 %
Lymphocytes Relative: 8 %
Lymphs Abs: 0.8 10*3/uL (ref 0.7–4.0)
MCH: 34.5 pg — ABNORMAL HIGH (ref 26.0–34.0)
MCHC: 37 g/dL — ABNORMAL HIGH (ref 30.0–36.0)
MCV: 93.3 fL (ref 80.0–100.0)
Monocytes Absolute: 1.5 10*3/uL — ABNORMAL HIGH (ref 0.1–1.0)
Monocytes Relative: 14 %
Neutro Abs: 8.2 10*3/uL — ABNORMAL HIGH (ref 1.7–7.7)
Neutrophils Relative %: 78 %
Platelets: 143 10*3/uL — ABNORMAL LOW (ref 150–400)
RBC: 3.45 MIL/uL — ABNORMAL LOW (ref 4.22–5.81)
RDW: 11.4 % — ABNORMAL LOW (ref 11.5–15.5)
WBC: 10.5 10*3/uL (ref 4.0–10.5)
nRBC: 0 % (ref 0.0–0.2)

## 2021-01-23 LAB — BASIC METABOLIC PANEL
Anion gap: 9 (ref 5–15)
BUN: 51 mg/dL — ABNORMAL HIGH (ref 6–20)
CO2: 28 mmol/L (ref 22–32)
Calcium: 9.1 mg/dL (ref 8.9–10.3)
Chloride: 92 mmol/L — ABNORMAL LOW (ref 98–111)
Creatinine, Ser: 1.81 mg/dL — ABNORMAL HIGH (ref 0.61–1.24)
GFR, Estimated: 49 mL/min — ABNORMAL LOW (ref >60)
Glucose, Bld: 94 mg/dL (ref 70–99)
Potassium: 4.7 mmol/L (ref 3.5–5.1)
Sodium: 129 mmol/L — ABNORMAL LOW (ref 135–145)

## 2021-01-23 LAB — PHOSPHORUS: Phosphorus: 2.5 mg/dL (ref 2.5–4.6)

## 2021-01-23 LAB — CK: Total CK: 10086 U/L — ABNORMAL HIGH (ref 49–397)

## 2021-01-23 LAB — MAGNESIUM: Magnesium: 2.9 mg/dL — ABNORMAL HIGH (ref 1.7–2.4)

## 2021-01-23 MED ORDER — WHITE PETROLATUM EX OINT
TOPICAL_OINTMENT | CUTANEOUS | Status: DC | PRN
  Filled 2021-01-23: qty 5

## 2021-01-23 MED ORDER — ADULT MULTIVITAMIN W/MINERALS CH
1.0000 | ORAL_TABLET | Freq: Every day | ORAL | Status: DC
  Administered 2021-01-24 – 2021-01-26 (×3): 1 via ORAL
  Filled 2021-01-23 (×3): qty 1

## 2021-01-23 MED ORDER — SODIUM CHLORIDE 0.9 % IV BOLUS
1000.0000 mL | Freq: Once | INTRAVENOUS | Status: AC
  Administered 2021-01-23: 1000 mL via INTRAVENOUS

## 2021-01-23 MED ORDER — HYDROXYZINE HCL 25 MG PO TABS
25.0000 mg | ORAL_TABLET | Freq: Once | ORAL | Status: AC
  Administered 2021-01-23: 25 mg via ORAL
  Filled 2021-01-23: qty 1

## 2021-01-23 MED ORDER — NICOTINE 14 MG/24HR TD PT24
14.0000 mg | MEDICATED_PATCH | Freq: Every day | TRANSDERMAL | Status: DC
  Administered 2021-01-23 – 2021-01-26 (×4): 14 mg via TRANSDERMAL
  Filled 2021-01-23 (×4): qty 1

## 2021-01-23 MED ORDER — HYDROXYZINE HCL 25 MG PO TABS
25.0000 mg | ORAL_TABLET | Freq: Three times a day (TID) | ORAL | Status: DC | PRN
  Administered 2021-01-23 – 2021-01-26 (×5): 25 mg via ORAL
  Filled 2021-01-23 (×6): qty 1

## 2021-01-23 MED ORDER — HALOPERIDOL LACTATE 5 MG/ML IJ SOLN
1.0000 mg | Freq: Once | INTRAMUSCULAR | Status: AC | PRN
  Administered 2021-01-23: 1 mg via INTRAVENOUS
  Filled 2021-01-23: qty 1

## 2021-01-23 MED ORDER — BOOST / RESOURCE BREEZE PO LIQD CUSTOM
1.0000 | Freq: Two times a day (BID) | ORAL | Status: DC
  Administered 2021-01-24 – 2021-01-26 (×5): 1 via ORAL

## 2021-01-23 MED ORDER — BACLOFEN 10 MG PO TABS
5.0000 mg | ORAL_TABLET | Freq: Three times a day (TID) | ORAL | Status: DC | PRN
  Administered 2021-01-23 – 2021-01-26 (×4): 5 mg via ORAL
  Filled 2021-01-23 (×4): qty 1

## 2021-01-23 MED ORDER — PROSOURCE PLUS PO LIQD: 30.0000 mL | Freq: Every day | ORAL | Status: DC

## 2021-01-23 MED ORDER — HYDROCORTISONE 1 % EX CREA
TOPICAL_CREAM | Freq: Two times a day (BID) | CUTANEOUS | Status: DC
  Filled 2021-01-23: qty 28

## 2021-01-23 MED ORDER — DIPHENHYDRAMINE HCL 50 MG/ML IJ SOLN
25.0000 mg | Freq: Once | INTRAMUSCULAR | Status: AC
  Administered 2021-01-23: 25 mg via INTRAVENOUS
  Filled 2021-01-23: qty 1

## 2021-01-23 NOTE — Progress Notes (Signed)
Initial Nutrition Assessment  DOCUMENTATION CODES:   Not applicable  INTERVENTION:  - will order Boost Breeze BID, each supplement provides 250 kcal and 9 grams of protein. - will order 1 tablet multivitamin with minerals/day.   NUTRITION DIAGNOSIS:   Inadequate oral intake related to acute illness, nausea, vomiting as evidenced by per patient/family report.  GOAL:   Patient will meet greater than or equal to 90% of their needs  MONITOR:   PO intake, Supplement acceptance, Labs, Weight trends  REASON FOR ASSESSMENT:   Malnutrition Screening Tool  ASSESSMENT:   35 year old male with medical history of cyclical vomiting syndrome, cannabis use, bipolar disorder type I, and GERD. He presented to the ED d/t N/V x5 days.  Diet advanced from NPO to CLD on 8/24 at 2155 and to Soft today at 1033. No intakes documented since admission. Patient reports improvement in nausea and no episodes of vomiting today.  Symptoms started ~1 week ago and worsened over time to the point of being unable to keep anything down. He has a good appetite at baseline when not experiencing N/V flare.  Weight on 8/24 was 155 lb and PTA the most recently documented weight was on 04/15/20 when he weighed 160 lb.   Labs reviewed; Na: 129 mmol/l, Cl: 92 mmol/l, BUN: 51 mg/dl, creatinine: 0.48 mg/dl, LFTs elevated, GFR: 49 ml/min.  Medications reviewed; 40 mg IV protonix/day, 10 mEq IV KCl x4 runs 8/25. IVF; NS @ 100 ml/hr.   Diet Order:   Diet Order             DIET SOFT Room service appropriate? Yes; Fluid consistency: Thin  Diet effective now                   EDUCATION NEEDS:   No education needs have been identified at this time  Skin:  Skin Assessment: Reviewed RN Assessment  Last BM:  PTA/unknown  Height:   Ht Readings from Last 1 Encounters:  01/21/21 6\' 2"  (1.88 m)    Weight:   Wt Readings from Last 1 Encounters:  01/21/21 70.3 kg    Estimated Nutritional Needs:  Kcal:   2100-2300 kcal Protein:  105-115 grams Fluid:  >/= 2.3 L/day      01/23/21, MS, RD, LDN, CNSC Inpatient Clinical Dietitian RD pager # available in [facility]  After hours/weekend pager # available in [facility]

## 2021-01-23 NOTE — Progress Notes (Signed)
PROGRESS NOTE    Erik Cowan  RJJ:[id number] DOB: 10/30/1985 DOA: 01/21/2021 PCP: Patient, No Pcp Per (Inactive)   Brief Narrative:  The patient is a 35 year old African-American male with a past medical history significant for but not limited to cyclical vomiting syndrome, history of cannabis use, history of bipolar disorder type I, GERD as well as other comorbidities who presented with intractable nausea vomiting.  Patient reported that about 5 days ago he began to have persistent nausea vomiting and noticed that his vomitus has been brown with some blood-tinge.  He thought it was due to some cookies that he ate at that time.  Denies the cookies being laced with anything.  He started reporting diffuse abdominal pain but no fevers and denies any diarrhea.  He not been able to eat or drink for at least 5 days and unable to produce urine for the last 5 days.  Also noted to have low back pain and endorses tobacco and marijuana use though he states about 12 days ago since his last use.  Denies any alcohol.  Because of his intractable nausea and vomiting as well as abdominal pain he presented to the hospital.  He initially presented to [facility] and had a sodium of 123 but left and later arrived here less along with repeat sodium 117.  He was noted to have a K of 5.1 and a creatinine 11.8 no recent comparison for admission.  CT renal stone study was done was relatively unremarkable.  He was admitted for his intractable nausea vomiting likely in the setting of viral gastroenteritis versus hyperemesis syndrome in the setting of cannabis use.  He was placed on antiemetics with Zofran.  He had some hematemesis and noted some coffee-ground emesis but likely from Mallory-Weiss tear.  Given that his hemoglobin is stable he started on daily PPI.  His sodium was low on admission and is now improving with IV fluid hydration but worsened again along with his Renal Fxn. Will have GI evaluate for his intractable Nausea  and Vomiting and have Nephrology evaluate for his Renal Failure however after further review and improvement in his creatinine we will cancel the nephrology consultation given that his BUN/creatinine is trending down with IV fluid hydration.  Assessment & Plan:   Principal Problem:   Hyponatremia Active Problems:   Bipolar 1 disorder (HCC)   Intractable nausea and vomiting   Acute renal failure (ARF) (HCC)   Metabolic acidosis, increased anion gap (IAG)  Intractable nausea and vomiting and abdominal pain, improving -Admitted to the progressive care unit as inpatient -Currently unclear etiology that this could be viral gastroenteritis versus aberrant emesis cannabis syndrome; he does have a history of cyclical vomiting syndrome -UDS was checked and was positive for THC -Continue with supportive care and IV fluid hydration; he received a 1.5 L LR bolus and a 1 Liter NS bolus yesterday and will bolus another 1 L today and change his maintenance IV fluid hydration and increased it to 100 mL/h with normal saline -Continue with antiemetics with IV Zofran 4 mg every 6 as needed for nausea vomiting; also received metoclopramide 10 mg IV x1; also added Compazine 10 mg every 6 as needed for refractory nausea vomiting as well as a scopolamine patch -Patient was given capsaicin cream on the abdomen topically 0.075% -Currently getting IV hydromorphone 1 mg every 3 as needed severe pain for abdominal pain and IV morphine 2 mg IV every 3 as needed for moderate pain but this has been stopped  and changed to fentanyl 25 mcg every 2 as needed severe pain -Will get GI involved for his Intractable Nausea and Vomiting and they are recommending that the patient can take a hot shower and continue IV fluid hydration.  Patient is clinically doing better and his AKI is improving.  GI does not feel that his hematemesis is of any significance given that his hemoglobin/hematocrit is relatively stable but it is dropping with  IV fluid hydration likely dilutional effect  Rhabdomyolysis -Unclear etiology but he presented and had a CK of 10,864 and mildly improved to 10,281 yesterday and today it is 10,086 -Ordered a heating pad for his soreness and abdominal soreness -Continued with IV fluid hydration but increased normal saline to 100 MLS per hour and he was given an additional 1 Liter NS Bolus yesterday and will be given another 1 today -Continue monitor and trend and repeat CK in the AM   Hematemesis, improved -Patient presented with coffee-ground emesis but this is since resolved -Possibly from Mallory-Weiss tear from intractable nausea vomiting -He has actually having an erythrocytosis and now hemoglobin/hematocrit is trended down and went from 17.1/45.9 -> 15.5/41.2 -> 15.0/44.0 -> 13.3/35.3 has further trended down to 11.9/32.2 -Check anemia panel in the a.m.  -Continue to monitor for signs and symptoms of bleeding; -Will get GI Involved for his Hematemesis and they are not concerned about his hematemesis given his relatively stable hemoglobins  Hyponatremia, fluctuating -Acute -In the setting of hypovolemia from GI losses -Continue monitor sodium every 4 hours -Sodium is now relatively stable and went from 130 trended down to 129 -Continue to monitor and trend carefully and repeat CMP in a.m. and continue to monitor sodiums every 4 -IVF hydration as above  Abnormal LFTs -In the setting of rhabdomyolysis -On presentation his AST was 138, ALT was 42 -Now AST is worsened to 200 and ALT is 67 -Continue monitor trend hepatic function carefully and will obtain a right upper quadrant ultrasound as well as a acute hepatitis panel in the AM  -Repeat CMP in the a.m.  Leukocytosis, improved -Likely reactive in the setting of his nausea vomiting and poor p.o. intake given that he is likely hemoconcentrated and dehydrated -WBC on admission was 20.9 and repeat was 21.1 and is now 13.0 -> 10.5  -Continue monitor  for signs and symptoms of infection; -Repeat CBC in the AM   Acute Renal Failure Elevated anion gap  Hyperphosphatemia  -Suspect both prerenal and ATN possibly; has not had really any urine output reported for 5 days -Checking bladder scan, UA, urine sodium and creatinine -Urinalysis showed a cloudy appearance with large hemoglobin, few bacteria 21-50 RBCs per high-power field and 6-10 WBCs; urine sodium was 26, urine creatinine was 117.99 -His CT renal stone study done and there is no hydronephrosis noted or obstruction -Patient presented with a BUNs/creatinine of 117/11.42 and BUN/Cr trending down further and is now 51/1.81 -Patient also presented with an elevated anion gap of 33, CO2 of 23, chloride level of 67; now his anion gap is 9, chloride level was 92, CO2 is 28 -IV fluid hydration as above -Avoid further nephrotoxic medication, contrast dyes, hypotension and renally adjust medications -Currently no urgent indications for dialysis so we will hold off nephrology consultation -Patient's BUNs/creatinine is improving and will need to continue monitor renal function carefully and repeat BMPs every 4 and CMP in a.m.  Hx of bipolar disorder type I -Patient denies taking any prescription medication   GERD -Continue with IV PPI  pantoprazole 40 mg daily at bedtime  Fall -Patient had a witnessed fall in the hospital as he is very impulsive and got out of bed. -Hit his chin on the door handle but had no other facial trauma or head injury -Will provide ice pack and he had a mild prescription so I have asked nursing to clean his wound -Placed on fall precautions -May need sitter if he continues to be impulsive  Tobacco Abuse -Smokes 1 pack/day -Smoking cessation counseling given -We will start on nicotine patch 14 mg transdermally  Hyperbilirubinemia -Patient's T bili has gone from 1.1 and trended up to 1.3 -Likely reactive -Continue monitor and trend and repeat CMP in  a.m.  Thrombocytopenia -Patient's platelet count went from 154 is now 143 -Likely dilutional drop in the setting of a lot of fluid that he is gone -Continue to monitor for signs and symptoms of Bleeding; no overt bleeding noted -Repeat CBC in a.m.  DVT prophylaxis: SCDs Code Status: FULL CODE Family Communication: No family present at bedside Disposition Plan: Pending further clinical improvement and improvement in his renal function back to baseline  Status is: Inpatient  Remains inpatient appropriate because:Unsafe d/c plan, IV treatments appropriate due to intensity of illness or inability to take PO, and Inpatient level of care appropriate due to severity of illness  Dispo: The patient is from: Home              Anticipated d/c is to: Home              Patient currently is not medically stable to d/c.   Difficult to place patient No  Consultants:  Gastroenterology Nephrology   Procedures: None  Antimicrobials: Anti-infectives (From admission, onward)    None        Subjective: Seen and examined at bedside and he is improving and not as nauseous but he was impulsive this morning got up and fell and hit his face on the door.  No chest pain or shortness of breath.  Has some heat packs on his abdomen for his abdominal discomfort and pain.  Happy that his renal function is trending down further.  No lightheadedness or dizziness.  No other concerns reported at this time.  Objective: Vitals:   01/23/21 0831 01/23/21 1019 01/23/21 1030 01/23/21 1327  BP: 136/90 125/77 (!) 141/91 (!) 143/90  Pulse: (!) 53 (!) 51 (!) 52 (!) 57  Resp: 18 16 18 18   Temp: 97.8 F (36.6 C) 97.9 F (36.6 C) 97.8 F (36.6 C) 98.8 F (37.1 C)  TempSrc: Oral Oral Oral Oral  SpO2: 100% 100% 100% 100%  Weight:      Height:        Intake/Output Summary (Last 24 hours) at 01/23/2021 1424 Last data filed at 01/23/2021 1406 Gross per 24 hour  Intake 1495 ml  Output 950 ml  Net 545 ml     Filed Weights   01/21/21 1944 01/21/21 1950  Weight: 70.3 kg 70.3 kg   Examination: Physical Exam:  Constitutional: Thin African-American male currently in no acute distress appears more comfortable and not as nauseous Eyes: Lids and conjunctivae normal, sclerae anicteric  ENMT: External Ears, Nose appear normal. Grossly normal hearing.  Neck: Appears normal, supple, no cervical masses, normal ROM, no appreciable thyromegaly; no JVD Respiratory: Diminished to auscultation bilaterally with slightly coarse breath sounds, no wheezing, rales, rhonchi or crackles. Normal respiratory effort and patient is not tachypenic. No accessory muscle use.  Unlabored breathing Cardiovascular: RRR, no  murmurs / rubs / gallops. S1 and S2 auscultated. No extremity edema.  Abdomen: Soft, non-tender, non-distended.  Bowel sounds positive.  GU: Deferred. Musculoskeletal: No clubbing / cyanosis of digits/nails. No joint deformity upper and lower extremities.  Skin: No rashes, lesions, ulcers on limited skin evaluation. No induration; Warm and dry.  Neurologic: CN 2-12 grossly intact with no focal deficits. Romberg sign and cerebellar reflexes not assessed.  Psychiatric: Normal judgment and insight. Alert and oriented x 3. Normal mood and appropriate affect.   Data Reviewed: I have personally reviewed following labs and imaging studies  CBC: Recent Labs  Lab 01/21/21 1321 01/21/21 1955 01/21/21 2041 01/22/21 0905 01/23/21 0017  WBC 20.9* 21.1*  --  13.0* 10.5  NEUTROABS 18.3* 18.3*  --  10.7* 8.2*  HGB 17.1* 15.5 15.0 13.3 11.9*  HCT 45.9 41.2 44.0 35.3* 32.2*  MCV 91.6 90.4  --  91.0 93.3  PLT 199 193  --  154 143*    Basic Metabolic Panel: Recent Labs  Lab 01/21/21 1955 01/21/21 2041 01/22/21 0905 01/22/21 1242 01/22/21 1905 01/22/21 2107 01/23/21 0017 01/23/21 0839  NA 124*   < > 123* 134* 129* 128* 130* 129*  K 4.6   < > 4.0 3.1* 4.4 4.7 4.7 4.7  CL 71*   < > 80* 102 89* 90* 93*  92*  CO2 22   < > GLUCOSE 112*   < > 107* 84 116* 95 101* 94  BUN 123*   < > 111* 87* 88* 80* 70* 51*  CREATININE 10.09*   < > 6.43* 3.94* 3.59* 2.98* 2.52* 1.81*  CALCIUM 9.4   < > 9.4 6.8* 9.1 9.0 9.2 9.1  MG 2.7*  --  2.8*  --   --   --  2.9*  --   PHOS  --   --  6.6*  --   --   --  2.5  --    < > = values in this interval not displayed.    GFR: Estimated Creatinine Clearance: 56.6 mL/min (A) (by C-G formula based on SCr of 1.81 mg/dL (H)). Liver Function Tests: Recent Labs  Lab 01/21/21 1321 01/21/21 1955 01/22/21 0905 01/23/21 0017  AST 138* 143* 176* 200*  ALT 42 46* 58* 67*  ALKPHOS 85 75 66 58  BILITOT 0.9 1.1 1.1 1.3*  PROT 10.5* 9.4* 8.5* 7.6  ALBUMIN 5.6* 5.0 4.6 4.4    Recent Labs  Lab 01/21/21 1321 01/21/21 1955  LIPASE 26 26    No results for input(s): AMMONIA in the last 168 hours. Coagulation Profile: No results for input(s): INR, PROTIME in the last 168 hours. Cardiac Enzymes: Recent Labs  Lab 01/21/21 1955 01/22/21 0905 01/23/21 0017  CKTOTAL 10,864* 10,281* 10,086*   BNP (last 3 results) No results for input(s): PROBNP in the last 8760 hours. HbA1C: No results for input(s): HGBA1C in the last 72 hours. CBG: No results for input(s): GLUCAP in the last 168 hours. Lipid Profile: No results for input(s): CHOL, HDL, LDLCALC, TRIG, CHOLHDL, LDLDIRECT in the last 72 hours. Thyroid Function Tests: No results for input(s): TSH, T4TOTAL, FREET4, T3FREE, THYROIDAB in the last 72 hours. Anemia Panel: No results for input(s): VITAMINB12, FOLATE, FERRITIN, TIBC, IRON, RETICCTPCT in the last 72 hours. Sepsis Labs: No results for input(s): PROCALCITON, LATICACIDVEN in the last 168 hours.  Recent Results (from the past 240 hour(s))  Resp Panel by RT-PCR (Flu A&B, Covid) Nasopharyngeal Swab  Status: None   Collection Time: 01/21/21 10:12 PM   Specimen: Nasopharyngeal Swab; Nasopharyngeal(NP) swabs in vial transport medium  Result  Value Ref Range Status   SARS Coronavirus 2 by RT PCR NEGATIVE NEGATIVE Final    Comment: (NOTE) SARS-CoV-2 target nucleic acids are NOT DETECTED.  The SARS-CoV-2 RNA is generally detectable in upper respiratory specimens during the acute phase of infection. The lowest concentration of SARS-CoV-2 viral copies this assay can detect is 138 copies/mL. A negative result does not preclude SARS-Cov-2 infection and should not be used as the sole basis for treatment or other patient management decisions. A negative result may occur with  improper specimen collection/handling, submission of specimen other than nasopharyngeal swab, presence of viral mutation(s) within the areas targeted by this assay, and inadequate number of viral copies(<138 copies/mL). A negative result must be combined with clinical observations, patient history, and epidemiological information. The expected result is Negative.  Fact Sheet for Patients:  BloggerCourse.com  Fact Sheet for Healthcare Providers:  SeriousBroker.it  This test is no t yet approved or cleared by the [place] FDA and  has been authorized for detection and/or diagnosis of SARS-CoV-2 by FDA under an Emergency Use Authorization (EUA). This EUA will remain  in effect (meaning this test can be used) for the duration of the COVID-19 declaration under Section 564(b)(1) of the Act, 21 U.S.C.section 360bbb-3(b)(1), unless the authorization is terminated  or revoked sooner.       Influenza A by PCR NEGATIVE NEGATIVE Final   Influenza B by PCR NEGATIVE NEGATIVE Final    Comment: (NOTE) The Xpert Xpress SARS-CoV-2/FLU/RSV plus assay is intended as an aid in the diagnosis of influenza from Nasopharyngeal swab specimens and should not be used as a sole basis for treatment. Nasal washings and aspirates are unacceptable for Xpert Xpress SARS-CoV-2/FLU/RSV testing.  Fact Sheet for  Patients: BloggerCourse.com  Fact Sheet for Healthcare Providers: SeriousBroker.it  This test is not yet approved or cleared by the [place] FDA and has been authorized for detection and/or diagnosis of SARS-CoV-2 by FDA under an Emergency Use Authorization (EUA). This EUA will remain in effect (meaning this test can be used) for the duration of the COVID-19 declaration under Section 564(b)(1) of the Act, 21 U.S.C. section 360bbb-3(b)(1), unless the authorization is terminated or revoked.  Performed at [facility], [address] [place], [place] [address]      RN Pressure Injury Documentation:     Estimated body mass index is 19.9 kg/m as calculated from the following:   Height as of this encounter: 6\' 2"  (1.88 m).   Weight as of this encounter: 70.3 kg.  Malnutrition Type:   Malnutrition Characteristics:   Nutrition Interventions:   Radiology Studies: DG Chest Portable 1 View  Result Date: 01/21/2021 CLINICAL DATA:  Vomiting EXAM: PORTABLE CHEST 1 VIEW COMPARISON:  12/30/2015 FINDINGS: The heart size and mediastinal contours are within normal limits. Both lungs are clear. The visualized skeletal structures are unremarkable. IMPRESSION: Normal study. Electronically Signed   By: 02/29/2016 M.D.   On: 01/21/2021 20:14   CT Renal Stone Study  Result Date: 01/21/2021 CLINICAL DATA:  Flank pain, kidney stone suspected. Nausea and vomiting. Symptoms began 4 days after eating chocolate chip cookies. EXAM: CT ABDOMEN AND PELVIS WITHOUT CONTRAST TECHNIQUE: Multidetector CT imaging of the abdomen and pelvis was performed following the standard protocol without IV contrast. COMPARISON:  04/04/2015 FINDINGS: Lower chest: Lung bases are clear. Hepatobiliary: No focal liver abnormality  is seen. No gallstones, gallbladder wall thickening, or biliary dilatation. Pancreas: Unremarkable. No pancreatic ductal  dilatation or surrounding inflammatory changes. Spleen: Normal in size without focal abnormality. Adrenals/Urinary Tract: Adrenal glands are unremarkable. Kidneys are normal, without renal calculi, focal lesion, or hydronephrosis. Bladder is unremarkable. Stomach/Bowel: Stomach, small bowel, and colon are not abnormally distended. Small bowel are mostly decompressed, limiting evaluation of the wall but no specific inflammatory changes are identified. Residual contrast material or opaque medication in the colon. Surgical absence of the appendix. Vascular/Lymphatic: No significant vascular findings are present. No enlarged abdominal or pelvic lymph nodes. Reproductive: Prostate is unremarkable. Other: No free air or free fluid in the abdomen. Abdominal wall musculature appears intact. Musculoskeletal: No acute bony abnormalities. IMPRESSION: No renal or ureteral stone or obstruction. No evidence of bowel obstruction or inflammation. Electronically Signed   By: Burman NievesWilliam  Stevens M.D.   On: 01/21/2021 21:23    Scheduled Meds:  chlorhexidine  15 mL Mouth Rinse BID   mouth rinse  15 mL Mouth Rinse q12n4p   nicotine  14 mg Transdermal Daily   pantoprazole (PROTONIX) IV  40 mg Intravenous QHS   scopolamine  1 patch Transdermal Q72H   Continuous Infusions:  sodium chloride 100 mL/hr at 01/23/21 1108    LOS: 2 days   Merlene Laughtermair Latif Luman Holway, DO [facility] PAGER is on AMION  If 7PM-7AM, please contact night-coverage [email or web address]

## 2021-01-23 NOTE — Progress Notes (Signed)
PCP has new orders for the patient.

## 2021-01-23 NOTE — Progress Notes (Signed)
Pt reassessed. Pt states he feels as though the nasal cannula is helping some. Pt states his shortness of breath comes, but the oxygen helps it go away.

## 2021-01-23 NOTE — Progress Notes (Signed)
   01/23/21 0829  What Happened  Was fall witnessed? Yes  Who witnessed fall? Phillis Knack, RN  Patients activity before fall shower  Point of contact other (comment) (Right chin/neck)  Was patient injured? Yes (Scrape to right chin area, no other pain, no headache)  Follow Up  MD notified MD Marland Mcalpine  Time MD notified [phone]  Family notified No - patient refusal  Additional tests No  Simple treatment Other (comment) (Chin very superficial, no blood. Pt refused ice.)  Progress note created (see row info) Yes  Adult Fall Risk Assessment  Risk Factor Category (scoring not indicated) Fall has occurred during this admission (document High fall risk)  Patient Fall Risk Level High fall risk  Adult Fall Risk Interventions  Required Bundle Interventions *See Row Information* High fall risk - low, moderate, and high requirements implemented  Additional Interventions Use of appropriate toileting equipment (bedpan, BSC, etc.)  Screening for Fall Injury Risk (To be completed on HIGH fall risk patients) - Assessing Need for Floor Mats  Risk For Fall Injury- Criteria for Floor Mats Previous fall this admission  Will Implement Floor Mats Yes  Pain Assessment  Pain Scale 0-10  Pain Score 7  Pain Type Acute pain  Pain Location Abdomen  Pain Orientation Mid  Pain Descriptors / Indicators Cramping  Pain Frequency Constant  Pain Onset On-going  Patients Stated Pain Goal 4  Pain Intervention(s) Repositioned  PCA/Epidural/Spinal Assessment  Respiratory Pattern Regular;Unlabored  Neurological  Neuro (WDL) WDL  Level of Consciousness Alert  Orientation Level Oriented X4  Cognition Appropriate at baseline;Follows commands;Appropriate attention/concentration;Appropriate judgement;Appropriate safety awareness;Appropriate for developmental age  Speech Clear  Musculoskeletal  Musculoskeletal (WDL) X  Assistive Device Front wheel walker  Generalized Weakness Yes  Integumentary  Integumentary (WDL) X   Skin Condition Dry  Skin Integrity Abrasion  Abrasion Location Back;Knee (Right chin scrape)  Abrasion Location Orientation Mid (Mid back, left knee)  Pain Assessment  Date Pain First Started  (Admission)  Result of Injury No  Pain Assessment  Work-Related Injury No

## 2021-01-23 NOTE — Progress Notes (Signed)
Pt complained of shortness of breath. Placed on 2L nasal cannula for comfort. An EKG was performed due to possible change and compared to previous EKG, no obvious changes noted. MD notified.

## 2021-01-23 NOTE — Plan of Care (Signed)
  Problem: Health Behavior/Discharge Planning: Goal: Ability to manage health-related needs will improve Outcome: Progressing   Problem: Clinical Measurements: Goal: Ability to maintain clinical measurements within normal limits will improve Outcome: Progressing Goal: Will remain free from infection Outcome: Progressing   

## 2021-01-23 NOTE — Progress Notes (Signed)
Subjective: Feeling a little better.  Objective: Vital signs in last 24 hours: Temp:  [97.9 F (36.6 C)-98.6 F (37 C)] 98.6 F (37 C) (08/26 0602) Pulse Rate:  [50-95] 50 (08/26 0602) Resp:  [17-25] 20 (08/26 0602) BP: (130-157)/(81-116) 149/86 (08/26 0602) SpO2:  [94 %-100 %] 100 % (08/26 0602)    Intake/Output from previous day: 08/25 0701 - 08/26 0700 In: 3359.7 [I.V.:2260.8; IV Piggyback:1098.9] Out: -  Intake/Output this shift: No intake/output data recorded.  General appearance: fatigued appearing, but not in severe distress GI: flat, soft, diffuse tenderness  Lab Results: Recent Labs    01/21/21 1955 01/21/21 2041 01/22/21 0905 01/23/21 0017  WBC 21.1*  --  13.0* 10.5  HGB 15.5 15.0 13.3 11.9*  HCT 41.2 44.0 35.3* 32.2*  PLT 193  --  154 143*   BMET Recent Labs    01/22/21 1905 01/22/21 2107 01/23/21 0017  NA 129* 128* 130*  K 4.4 4.7 4.7  CL 89* 90* 93*  CO2 29 26 27   GLUCOSE 116* 95 101*  BUN 88* 80* 70*  CREATININE 3.59* 2.98* 2.52*  CALCIUM 9.1 9.0 9.2   LFT Recent Labs    01/22/21 0905 01/23/21 0017  PROT 8.5* 7.6  ALBUMIN 4.6 4.4  AST 176* 200*  ALT 58* 67*  ALKPHOS 66 58  BILITOT 1.1 1.3*  BILIDIR 0.3*  --   IBILI 0.8  --    PT/INR No results for input(s): LABPROT, INR in the last 72 hours. Hepatitis Panel No results for input(s): HEPBSAG, HCVAB, HEPAIGM, HEPBIGM in the last 72 hours. C-Diff No results for input(s): CDIFFTOX in the last 72 hours. Fecal Lactopherrin No results for input(s): FECLLACTOFRN in the last 72 hours.  Studies/Results: DG Chest Portable 1 View  Result Date: 01/21/2021 CLINICAL DATA:  Vomiting EXAM: PORTABLE CHEST 1 VIEW COMPARISON:  12/30/2015 FINDINGS: The heart size and mediastinal contours are within normal limits. Both lungs are clear. The visualized skeletal structures are unremarkable. IMPRESSION: Normal study. Electronically Signed   By: 02/29/2016 M.D.   On: 01/21/2021 20:14   CT Renal  Stone Study  Result Date: 01/21/2021 CLINICAL DATA:  Flank pain, kidney stone suspected. Nausea and vomiting. Symptoms began 4 days after eating chocolate chip cookies. EXAM: CT ABDOMEN AND PELVIS WITHOUT CONTRAST TECHNIQUE: Multidetector CT imaging of the abdomen and pelvis was performed following the standard protocol without IV contrast. COMPARISON:  04/04/2015 FINDINGS: Lower chest: Lung bases are clear. Hepatobiliary: No focal liver abnormality is seen. No gallstones, gallbladder wall thickening, or biliary dilatation. Pancreas: Unremarkable. No pancreatic ductal dilatation or surrounding inflammatory changes. Spleen: Normal in size without focal abnormality. Adrenals/Urinary Tract: Adrenal glands are unremarkable. Kidneys are normal, without renal calculi, focal lesion, or hydronephrosis. Bladder is unremarkable. Stomach/Bowel: Stomach, small bowel, and colon are not abnormally distended. Small bowel are mostly decompressed, limiting evaluation of the wall but no specific inflammatory changes are identified. Residual contrast material or opaque medication in the colon. Surgical absence of the appendix. Vascular/Lymphatic: No significant vascular findings are present. No enlarged abdominal or pelvic lymph nodes. Reproductive: Prostate is unremarkable. Other: No free air or free fluid in the abdomen. Abdominal wall musculature appears intact. Musculoskeletal: No acute bony abnormalities. IMPRESSION: No renal or ureteral stone or obstruction. No evidence of bowel obstruction or inflammation. Electronically Signed   By: 13/08/2014 M.D.   On: 01/21/2021 21:23    Medications: Scheduled:  chlorhexidine  15 mL Mouth Rinse BID   mouth rinse  15  mL Mouth Rinse q12n4p   pantoprazole (PROTONIX) IV  40 mg Intravenous QHS   scopolamine  1 patch Transdermal Q72H   Continuous:  sodium chloride 100 mL/hr at 01/23/21 0605    Assessment/Plan: 1) CHS. 2) AKI - improving. 3) Rhabdomyolysis. 4) Anemia - In  his baseline range.   The patient is stable.  Clinically he does report feeling better.  Nursing states that he is using a significant amount of Fentanyl.  He was not able to take a hot shower as he was on telemetry.  His AKI is improving with IV hydration and this appears to be secondary to a prerenal state and his rhabdomyolysis.  His CK still remains markedly elevated.  Plan: 1) Okay to take a hot shower. 2) Continue with IV hydration. 3) Monitor renal function and CK values.  LOS: 2 days   Taisley Mordan D 01/23/2021, 7:48 AM

## 2021-01-23 NOTE — Progress Notes (Signed)
Patient very restless this shift, constantly getting OOB unassisted. PRN medications ineffective per patient. Pt able to silence his bed alarm, and possibly able to turn it off himself (bed alarm off multiple times this shift without staff turning it off- staff made sure bed alarm reset after leaving the room each time). Pt found in shower twice this shift without staff turning off bed alarm. Pt frequently gets OOB to squat on the floor. Pt educated many times on importance of calling staff for help before getting OOB (pt VERY unsteady, legs very weak, moves too quickly and gets tangled up in lines). Bed alarm remains on at this time.

## 2021-01-23 NOTE — Progress Notes (Signed)
This patient is very impulsive.He jumped in the shower for the 4th time today. The IV was not wrapped and the telemetry was on the patient. The RN has asked him to call for assistance but he does not follow directions. He locked himself in the bathroom but the RN was able to unlock the door.  The staff are having to constantly go into this patient's room and are not able to really find much time to take care of their other patients.  PCP was notified.

## 2021-01-24 ENCOUNTER — Inpatient Hospital Stay (HOSPITAL_COMMUNITY): Payer: Self-pay

## 2021-01-24 LAB — CBC WITH DIFFERENTIAL/PLATELET
Abs Immature Granulocytes: 0.05 10*3/uL (ref 0.00–0.07)
Basophils Absolute: 0 10*3/uL (ref 0.0–0.1)
Basophils Relative: 0 %
Eosinophils Absolute: 0 10*3/uL (ref 0.0–0.5)
Eosinophils Relative: 0 %
HCT: 29.5 % — ABNORMAL LOW (ref 39.0–52.0)
Hemoglobin: 10.6 g/dL — ABNORMAL LOW (ref 13.0–17.0)
Immature Granulocytes: 1 %
Lymphocytes Relative: 11 %
Lymphs Abs: 1 10*3/uL (ref 0.7–4.0)
MCH: 34.3 pg — ABNORMAL HIGH (ref 26.0–34.0)
MCHC: 35.9 g/dL (ref 30.0–36.0)
MCV: 95.5 fL (ref 80.0–100.0)
Monocytes Absolute: 1 10*3/uL (ref 0.1–1.0)
Monocytes Relative: 12 %
Neutro Abs: 6.8 10*3/uL (ref 1.7–7.7)
Neutrophils Relative %: 76 %
Platelets: 125 10*3/uL — ABNORMAL LOW (ref 150–400)
RBC: 3.09 MIL/uL — ABNORMAL LOW (ref 4.22–5.81)
RDW: 11.4 % — ABNORMAL LOW (ref 11.5–15.5)
WBC: 8.9 10*3/uL (ref 4.0–10.5)
nRBC: 0 % (ref 0.0–0.2)

## 2021-01-24 LAB — HEPATITIS PANEL, ACUTE
HCV Ab: NONREACTIVE
Hep A IgM: NONREACTIVE
Hep B C IgM: NONREACTIVE
Hepatitis B Surface Ag: NONREACTIVE

## 2021-01-24 LAB — MAGNESIUM: Magnesium: 2.3 mg/dL (ref 1.7–2.4)

## 2021-01-24 LAB — COMPREHENSIVE METABOLIC PANEL
ALT: 78 U/L — ABNORMAL HIGH (ref 0–44)
AST: 193 U/L — ABNORMAL HIGH (ref 15–41)
Albumin: 4.2 g/dL (ref 3.5–5.0)
Alkaline Phosphatase: 50 U/L (ref 38–126)
Anion gap: 7 (ref 5–15)
BUN: 23 mg/dL — ABNORMAL HIGH (ref 6–20)
CO2: 27 mmol/L (ref 22–32)
Calcium: 9 mg/dL (ref 8.9–10.3)
Chloride: 103 mmol/L (ref 98–111)
Creatinine, Ser: 1.09 mg/dL (ref 0.61–1.24)
GFR, Estimated: >60 mL/min (ref >60)
Glucose, Bld: 116 mg/dL — ABNORMAL HIGH (ref 70–99)
Potassium: 4.5 mmol/L (ref 3.5–5.1)
Sodium: 137 mmol/L (ref 135–145)
Total Bilirubin: 1.4 mg/dL — ABNORMAL HIGH (ref 0.3–1.2)
Total Protein: 7.1 g/dL (ref 6.5–8.1)

## 2021-01-24 LAB — CK: Total CK: 7160 U/L — ABNORMAL HIGH (ref 49–397)

## 2021-01-24 LAB — IRON AND TIBC
Iron: 156 ug/dL (ref 45–182)
Saturation Ratios: 50 % — ABNORMAL HIGH (ref 17.9–39.5)
TIBC: 312 ug/dL (ref 250–450)
UIBC: 156 ug/dL

## 2021-01-24 LAB — RETICULOCYTES
Immature Retic Fract: 2.8 % (ref 2.3–15.9)
RBC.: 3.12 MIL/uL — ABNORMAL LOW (ref 4.22–5.81)
Retic Count, Absolute: 25.3 10*3/uL (ref 19.0–186.0)
Retic Ct Pct: 0.8 % (ref 0.4–3.1)

## 2021-01-24 LAB — FOLATE: Folate: 12.3 ng/mL (ref >5.9)

## 2021-01-24 LAB — FERRITIN: Ferritin: 215 ng/mL (ref 24–336)

## 2021-01-24 LAB — PHOSPHORUS: Phosphorus: 1.1 mg/dL — ABNORMAL LOW (ref 2.5–4.6)

## 2021-01-24 LAB — VITAMIN B12: Vitamin B-12: 415 pg/mL (ref 180–914)

## 2021-01-24 MED ORDER — CHLORPROMAZINE HCL 10 MG PO TABS
10.0000 mg | ORAL_TABLET | Freq: Three times a day (TID) | ORAL | Status: DC | PRN
  Administered 2021-01-24: 10 mg via ORAL
  Filled 2021-01-24 (×2): qty 1

## 2021-01-24 MED ORDER — SODIUM CHLORIDE 0.9 % IV BOLUS
1000.0000 mL | Freq: Once | INTRAVENOUS | Status: AC
  Administered 2021-01-24: 1000 mL via INTRAVENOUS

## 2021-01-24 NOTE — Progress Notes (Signed)
   01/24/21 2213  Vitals  Temp 98.4 F (36.9 C)  Temp Source Oral  BP (!) 142/101  MAP (mmHg) 115  BP Location Left Arm  BP Method Automatic  Patient Position (if appropriate) Lying  Pulse Rate (!) 52  Resp 18  MEWS COLOR  MEWS Score Color Green  Oxygen Therapy  SpO2 100 %  O2 Device Room Air  MEWS Score  MEWS Temp 0  MEWS Systolic 0  MEWS Pulse 0  MEWS RR 0  MEWS LOC 0  MEWS Score 0   Night shift provider, made aware of BP listed above per orders, via secure chat.

## 2021-01-24 NOTE — Progress Notes (Addendum)
PROGRESS NOTE    Erik Cowan  CBU:[id number] DOB: Jun 29, 1985 DOA: 01/21/2021 PCP: Patient, No Pcp Per (Inactive)   Brief Narrative:  The patient is a 35 year old African-American male with a past medical history significant for but not limited to cyclical vomiting syndrome, history of cannabis use, history of bipolar disorder type I, GERD as well as other comorbidities who presented with intractable nausea vomiting.  Patient reported that about 5 days ago he began to have persistent nausea vomiting and noticed that his vomitus has been brown with some blood-tinge.  He thought it was due to some cookies that he ate at that time.  Denies the cookies being laced with anything.  He started reporting diffuse abdominal pain but no fevers and denies any diarrhea.  He not been able to eat or drink for at least 5 days and unable to produce urine for the last 5 days.  Also noted to have low back pain and endorses tobacco and marijuana use though he states about 12 days ago since his last use.  Denies any alcohol.  Because of his intractable nausea and vomiting as well as abdominal pain he presented to the hospital.  He initially presented to [facility] and had a sodium of 123 but left and later arrived here less along with repeat sodium 117.  He was noted to have a K of 5.1 and a creatinine 11.8 no recent comparison for admission.  CT renal stone study was done was relatively unremarkable.  He was admitted for his intractable nausea vomiting likely in the setting of viral gastroenteritis versus hyperemesis syndrome in the setting of cannabis use.  He was placed on antiemetics with Zofran.  He had some hematemesis and noted some coffee-ground emesis but likely from Mallory-Weiss tear.  Given that his hemoglobin is stable he started on daily PPI.  His sodium was low on admission and is now improving with IV fluid hydration but worsened again along with his Renal Fxn. Will have GI evaluate for his intractable Nausea  and Vomiting and have Nephrology evaluate for his Renal Failure however after further review and improvement in his creatinine we will cancel the nephrology consultation given that his BUN/creatinine is trending down with IV fluid hydration.  Patient's rhabdomyolysis is also improving with IV fluid hydration he will be given another 1 L bolus today.  Patient continues to be nauseous and continues to have hiccups and the baclofen does not seem to get sorted so we will try Thorazine  Assessment & Plan:   Principal Problem:   Hyponatremia Active Problems:   Bipolar 1 disorder (HCC)   Intractable nausea and vomiting   Acute renal failure (ARF) (HCC)   Metabolic acidosis, increased anion gap (IAG)  Intractable nausea and vomiting and abdominal pain, improving slowly  -Admitted to the progressive care unit as inpatient -Currently unclear etiology that this could be viral gastroenteritis versus aberrant emesis cannabis syndrome; he does have a history of cyclical vomiting syndrome -UDS was checked and was positive for THC -Continue with supportive care and IV fluid hydration; he received multiple IV fluid boluses and will be getting another 1 L today and will continue his maintenance IV fluid hydration and increased it to 100 mL/h with normal saline -Continue with antiemetics with IV Zofran 4 mg every 6 as needed for nausea vomiting; also received metoclopramide 10 mg IV x1; also added Compazine 10 mg every 6 as needed for refractory nausea vomiting as well as a scopolamine patch -Patient was given  capsaicin cream on the abdomen topically 0.075% -Currently getting IV hydromorphone 1 mg every 3 as needed severe pain for abdominal pain and IV morphine 2 mg IV every 3 as needed for moderate pain but this has been stopped and changed to fentanyl 25 mcg every 2 as needed severe pain -Will get GI involved for his Intractable Nausea and Vomiting and they are recommending that the patient can take a hot  shower and continue IV fluid hydration.  Patient is clinically doing better and his AKI is improving.  GI does not feel that his hematemesis is of any significance given that his hemoglobin/hematocrit is relatively stable but it is dropping with IV fluid hydration likely dilutional effect  Rhabdomyolysis -Unclear etiology but he presented and had a CK of 10,864 -> 10,281 -> 10,086 -> 7,160 -Ordered a heating pad for his soreness and abdominal soreness -Continued with IV fluid hydration but increased normal saline to 100 MLS per hour and he was given an additional 1 Liter NS Bolus yesterday and will be given another 1 today -Continue monitor and trend and repeat CK in the AM   Hypophosphatemia -Patient's Phos Level was 1.1 -Replete with IV Sodium Phos 30 mmol -Continue to Monitor and Replete as Necessary  -Repeat Phos Level in the AM   Hematemesis, improved -Patient presented with coffee-ground emesis but this is since resolved -Possibly from Mallory-Weiss tear from intractable nausea vomiting -He has actually having an erythrocytosis and now hemoglobin/hematocrit is trended down and went from 17.1/45.9 -> 15.5/41.2 -> 15.0/44.0 -> 13.3/35.3 has further trended down to 11.9/32.2 -> 10.6/29.5 -Checked Anemia Panel and showed an iron level of 156, U IBC 156, TIBC 312, saturation ratios of 50%, ferritin level 215, ALT of 12.3, vitamin B12 415 -Continue to monitor for signs and symptoms of bleeding; -Will get GI Involved for his Hematemesis and they are not concerned about his hematemesis given his relatively stable hemoglobins  Hyponatremia, fluctuating -Acute -In the setting of hypovolemia from GI losses -Continue monitor sodium every 4 hours -Sodium is now relatively stable and is now improved to 137 -Continue to monitor and trend carefully and repeat CMP in a.m. and continue to monitor sodiums every 4 -IVF hydration as above  Abnormal LFTs -In the setting of rhabdomyolysis -On  presentation his AST was 138, ALT was 42 -Now AST is worsened to 200 and ALT is 67 yesterday but is now improving and AST is 193 and ALT is 78 -Continue monitor trend hepatic function carefully  -RUQ U/S was normal and Acute Hepatitis Panel not done yet -Repeat CMP in the a.m.  Leukocytosis, improved -Likely reactive in the setting of his nausea vomiting and poor p.o. intake given that he is likely hemoconcentrated and dehydrated -WBC on admission was 20.9 and repeat was 21.1 and is now 13.0 -> 10.5 -> 8.9 -Continue monitor for signs and symptoms of infection; -Repeat CBC in the AM   Acute Renal Failure Elevated anion gap  Hyperphosphatemia -> Hypophosphatemia (see above)  -Suspect both prerenal and ATN possibly; has not had really any urine output reported for 5 days -Checking bladder scan, UA, urine sodium and creatinine -Urinalysis showed a cloudy appearance with large hemoglobin, few bacteria 21-50 RBCs per high-power field and 6-10 WBCs; urine sodium was 26, urine creatinine was 117.99 -His CT renal stone study done and there is no hydronephrosis noted or obstruction -Patient presented with a BUNs/creatinine of 117/11.42 and BUN/Cr trending down further and is now 23/1.09 -Patient also presented with an  elevated anion gap of 33, CO2 of 23, chloride level of 67; now his anion gap is 7, chloride level is 103, CO2 is 27 -IV fluid hydration as above -Avoid further nephrotoxic medication, contrast dyes, hypotension and renally adjust medications -Currently no urgent indications for dialysis so we will hold off nephrology consultation -Patient's BUNs/creatinine is improving and will need to continue monitor renal function carefully and repeat BMPs every 4 and CMP in a.m.  Hx of Bipolar Disorder Type I -Patient denies taking any prescription medication   GERD -Continue with IV PPI pantoprazole 40 mg daily at bedtime  Fall -Patient had a witnessed fall in the hospital as he is very  impulsive and got out of bed. -Hit his chin on the door handle but had no other facial trauma or head injury -Will provide ice pack and he had a mild prescription so I have asked nursing to clean his wound -Placed on fall precautions -May need sitter if he continues to be impulsive; Last night he received IV Haldol because of his impulsiveness  Hiccups/Singultus -Tried Baclofen without Success -Will try Thorazine 10 mg TIDprn  Tobacco Abuse -Smokes 1 pack/day -Smoking cessation counseling given -We will start on nicotine patch 14 mg transdermally  Face Rash -Improving -C/w Hydrocortisone Cream 1%  Hyperbilirubinemia -Patient's T bili has gone from 1.1 and trended up to 1.3 -> 1.4 -Likely reactive -Continue monitor and trend and repeat CMP in a.m.  Thrombocytopenia -Patient's platelet count went from 154 -> 143 -> 125 -Likely dilutional drop in the setting of a lot of fluid that he is gone -Continue to monitor for signs and symptoms of Bleeding; no overt bleeding noted -Repeat CBC in a.m.  DVT prophylaxis: SCDs Code Status: FULL CODE Family Communication: No family present at bedside Disposition Plan: Pending further clinical improvement and improvement in his renal function back to baseline  Status is: Inpatient  Remains inpatient appropriate because:Unsafe d/c plan, IV treatments appropriate due to intensity of illness or inability to take PO, and Inpatient level of care appropriate due to severity of illness  Dispo: The patient is from: Home              Anticipated d/c is to: Home              Patient currently is not medically stable to d/c.   Difficult to place patient No  Consultants:  Gastroenterology Nephrology   Procedures: None  Antimicrobials: Anti-infectives (From admission, onward)    None        Subjective: Seen and examined at bedside and his renal function is improving.  He continues to have hiccups.  Also states he continues have nausea.  No  chest pain or shortness of breath.  Continues to have some mild abdominal discomfort.  No other concerns or complaints at this time.  Objective: Vitals:   01/23/21 2126 01/24/21 0425 01/24/21 0859 01/24/21 1400  BP: 134/87 (!) 156/90 140/84 (!) 156/93  Pulse: (!) 49 (!) 59 (!) 54 (!) 56  Resp: 18 18 18 18   Temp: 98.7 F (37.1 C) 98.9 F (37.2 C)    TempSrc: Oral Oral    SpO2: 99% 99% 100% 100%  Weight:      Height:        Intake/Output Summary (Last 24 hours) at 01/24/2021 1403 Last data filed at 01/24/2021 0600 Gross per 24 hour  Intake 2806.64 ml  Output 600 ml  Net 2206.64 ml    Filed Weights   01/21/21  1944 01/21/21 1950  Weight: 70.3 kg 70.3 kg   Examination: Physical Exam:  Constitutional: Thin African-American male currently in no acute distress appears calm but still is nauseous and having hiccups  Eyes: Lids and conjunctivae normal, sclerae anicteric  ENMT: External Ears, Nose appear normal. Grossly normal hearing. Facial redness around mouth is improved Neck: Appears normal, supple, no cervical masses, normal ROM, no appreciable thyromegaly; no JVD Respiratory: Diminished to auscultation bilaterally with coarse breath sounds, no wheezing, rales, rhonchi or crackles. Normal respiratory effort and patient is not tachypenic. No accessory muscle use. Unlabored breathing  Cardiovascular: RRR, no murmurs / rubs / gallops. S1 and S2 auscultated. No extremity edema.  Abdomen: Soft, mildly tender, non-distended. Bowel sounds positive.  GU: Deferred. Musculoskeletal: No clubbing / cyanosis of digits/nails. No joint deformity upper and lower extremities.  Skin: No rashes, lesions, ulcers. No induration; Warm and dry.  Neurologic: CN 2-12 grossly intact with no focal deficits.  Romberg sign and cerebellar reflexes not assessed.  Psychiatric: Normal judgment and insight. Alert and oriented x 3. Normal mood and appropriate affect.   Data Reviewed: I have personally reviewed  following labs and imaging studies  CBC: Recent Labs  Lab 01/21/21 1321 01/21/21 1955 01/21/21 2041 01/22/21 0905 01/23/21 0017 01/24/21 0550  WBC 20.9* 21.1*  --  13.0* 10.5 8.9  NEUTROABS 18.3* 18.3*  --  10.7* 8.2* 6.8  HGB 17.1* 15.5 15.0 13.3 11.9* 10.6*  HCT 45.9 41.2 44.0 35.3* 32.2* 29.5*  MCV 91.6 90.4  --  91.0 93.3 95.5  PLT 199 193  --  154 143* 125*    Basic Metabolic Panel: Recent Labs  Lab 01/21/21 1955 01/21/21 2041 01/22/21 0905 01/22/21 1242 01/22/21 1905 01/22/21 2107 01/23/21 0017 01/23/21 0839 01/24/21 0550  NA 124*   < > 123*   < > 129* 128* 130* 129* 137  K 4.6   < > 4.0   < > 4.4 4.7 4.7 4.7 4.5  CL 71*   < > 80*   < > 89* 90* 93* 92* 103  CO2 22   < > 27   < > 29 26 27 28 27   GLUCOSE 112*   < > 107*   < > 116* 95 101* 94 116*  BUN 123*   < > 111*   < > 88* 80* 70* 51* 23*  CREATININE 10.09*   < > 6.43*   < > 3.59* 2.98* 2.52* 1.81* 1.09  CALCIUM 9.4   < > 9.4   < > 9.1 9.0 9.2 9.1 9.0  MG 2.7*  --  2.8*  --   --   --  2.9*  --  2.3  PHOS  --   --  6.6*  --   --   --  2.5  --  1.1*   < > = values in this interval not displayed.    GFR: Estimated Creatinine Clearance: 94.1 mL/min (by C-G formula based on SCr of 1.09 mg/dL). Liver Function Tests: Recent Labs  Lab 01/21/21 1321 01/21/21 1955 01/22/21 0905 01/23/21 0017 01/24/21 0550  AST 138* 143* 176* 200* 193*  ALT 42 46* 58* 67* 78*  ALKPHOS 85 75 66 58 50  BILITOT 0.9 1.1 1.1 1.3* 1.4*  PROT 10.5* 9.4* 8.5* 7.6 7.1  ALBUMIN 5.6* 5.0 4.6 4.4 4.2    Recent Labs  Lab 01/21/21 1321 01/21/21 1955  LIPASE 26 26    No results for input(s): AMMONIA in the last 168 hours. Coagulation  Profile: No results for input(s): INR, PROTIME in the last 168 hours. Cardiac Enzymes: Recent Labs  Lab 01/21/21 1955 01/22/21 0905 01/23/21 0017 01/24/21 0550  CKTOTAL 10,864* 10,281* 10,086* 7,160*   BNP (last 3 results) No results for input(s): PROBNP in the last 8760 hours. HbA1C: No  results for input(s): HGBA1C in the last 72 hours. CBG: No results for input(s): GLUCAP in the last 168 hours. Lipid Profile: No results for input(s): CHOL, HDL, LDLCALC, TRIG, CHOLHDL, LDLDIRECT in the last 72 hours. Thyroid Function Tests: No results for input(s): TSH, T4TOTAL, FREET4, T3FREE, THYROIDAB in the last 72 hours. Anemia Panel: Recent Labs    01/24/21 0550  VITAMINB12 415  FOLATE 12.3  FERRITIN 215  TIBC 312  IRON 156  RETICCTPCT 0.8   Sepsis Labs: No results for input(s): PROCALCITON, LATICACIDVEN in the last 168 hours.  Recent Results (from the past 240 hour(s))  Resp Panel by RT-PCR (Flu A&B, Covid) Nasopharyngeal Swab     Status: None   Collection Time: 01/21/21 10:12 PM   Specimen: Nasopharyngeal Swab; Nasopharyngeal(NP) swabs in vial transport medium  Result Value Ref Range Status   SARS Coronavirus 2 by RT PCR NEGATIVE NEGATIVE Final    Comment: (NOTE) SARS-CoV-2 target nucleic acids are NOT DETECTED.  The SARS-CoV-2 RNA is generally detectable in upper respiratory specimens during the acute phase of infection. The lowest concentration of SARS-CoV-2 viral copies this assay can detect is 138 copies/mL. A negative result does not preclude SARS-Cov-2 infection and should not be used as the sole basis for treatment or other patient management decisions. A negative result may occur with  improper specimen collection/handling, submission of specimen other than nasopharyngeal swab, presence of viral mutation(s) within the areas targeted by this assay, and inadequate number of viral copies(<138 copies/mL). A negative result must be combined with clinical observations, patient history, and epidemiological information. The expected result is Negative.  Fact Sheet for Patients:  BloggerCourse.com  Fact Sheet for Healthcare Providers:  SeriousBroker.it  This test is no t yet approved or cleared by the [place] FDA and  has been authorized for detection and/or diagnosis of SARS-CoV-2 by FDA under an Emergency Use Authorization (EUA). This EUA will remain  in effect (meaning this test can be used) for the duration of the COVID-19 declaration under Section 564(b)(1) of the Act, 21 U.S.C.section 360bbb-3(b)(1), unless the authorization is terminated  or revoked sooner.       Influenza A by PCR NEGATIVE NEGATIVE Final   Influenza B by PCR NEGATIVE NEGATIVE Final    Comment: (NOTE) The Xpert Xpress SARS-CoV-2/FLU/RSV plus assay is intended as an aid in the diagnosis of influenza from Nasopharyngeal swab specimens and should not be used as a sole basis for treatment. Nasal washings and aspirates are unacceptable for Xpert Xpress SARS-CoV-2/FLU/RSV testing.  Fact Sheet for Patients: BloggerCourse.com  Fact Sheet for Healthcare Providers: SeriousBroker.it  This test is not yet approved or cleared by the [place] FDA and has been authorized for detection and/or diagnosis of SARS-CoV-2 by FDA under an Emergency Use Authorization (EUA). This EUA will remain in effect (meaning this test can be used) for the duration of the COVID-19 declaration under Section 564(b)(1) of the Act, 21 U.S.C. section 360bbb-3(b)(1), unless the authorization is terminated or revoked.  Performed at [facility], [address] [place], [place] [address]      RN Pressure Injury Documentation:     Estimated body mass index is 19.9 kg/m as  calculated from the following:   Height as of this encounter:  (1.88 m).   Weight as of this encounter: 70.3 kg.  Malnutrition Type: Nutrition Problem: Inadequate oral intake Etiology: acute illness, nausea, vomiting Malnutrition Characteristics: Signs/Symptoms: per patient/family report Nutrition Interventions: Interventions: Boost Breeze, Prostat, MVI Radiology Studies: US Abdomen  Limited RUQ (LIVER/GB)  Result Date: 01/24/2021 CLINICAL DATA:  35 year old male with abnormal LFTs. EXAM: ULTRASOUND ABDOMEN LIMITED RIGHT UPPER QUADRANT COMPARISON:  Noncontrast CT Abdomen and Pelvis 01/21/2021. FINDINGS: Gallbladder: No gallstones or wall thickening visualized. No sonographic Murphy sign noted by sonographer. Common bile duct: Diameter: 4 mm, normal. Liver: Mild rib shadowing. Echogenicity appears within normal limits. No discrete liver lesion. No intrahepatic biliary ductal dilatation. Portal vein is patent on color Doppler imaging with normal direction of blood flow towards the liver. Other: Negative visible right kidney.  No free fluid. IMPRESSION: Normal right upper quadrant ultrasound. Electronically Signed   By: Odessa Fleming M.D.   On: 01/24/2021 07:59    Scheduled Meds:  chlorhexidine  15 mL Mouth Rinse BID   feeding supplement  1 Container Oral BID BM   hydrocortisone cream   Topical BID   mouth rinse  15 mL Mouth Rinse q12n4p   multivitamin with minerals  1 tablet Oral Daily   nicotine  14 mg Transdermal Daily   pantoprazole (PROTONIX) IV  40 mg Intravenous QHS   scopolamine  1 patch Transdermal Q72H   Continuous Infusions:  sodium chloride 100 mL/hr at 01/23/21 1108    LOS: 3 days   Merlene Laughter, DO [facility] PAGER is on AMION  If 7PM-7AM, please contact night-coverage [email or web address]

## 2021-01-24 NOTE — Progress Notes (Addendum)
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 Overall he seems to be doing well clinically but the hiccups seem to be bothering him.  He denies having any nausea vomiting or abdominal pain.  He had a small volume BM today without any blood in the stool.  Objective: Vital signs in last 24 hours: Temp:  [97.8 F (36.6 C)-98.9 F (37.2 C)] 98.9 F (37.2 C) (08/27 0425) Pulse Rate:  [49-59] 59 (08/27 0425) Resp:  [16-18] 18 (08/27 0425) BP: (125-156)/(77-91) 156/90 (08/27 0425) SpO2:  [99 %-100 %] 99 % (08/27 0425)    Intake/Output from previous day: 08/26 0701 - 08/27 0700 In: 2806.6 [P.O.:720; I.V.:2086.6] Out: 950 [Urine:950] Intake/Output this shift: No intake/output data recorded.  General appearance: alert, cooperative, appears stated age, fatigued, and no distress; hiccuping continously Resp: clear to auscultation bilaterally Cardio: regular rate and rhythm, S1, S2 normal, no murmur, click, rub or gallop GI: soft, non-tender; bowel sounds normal; no masses,  no organomegaly Extremities: extremities normal, atraumatic, no cyanosis or edema  Lab Results: Recent Labs    01/21/21 1955 01/21/21 2041 01/22/21 0905 01/23/21 0017  WBC 21.1*  --  13.0* 10.5  HGB 15.5 15.0 13.3 11.9*  HCT 41.2 44.0 35.3* 32.2*  PLT 193  --  154 143*   BMET Recent Labs    01/22/21 2107 01/23/21 0017 01/23/21 0839  NA 128* 130* 129*  K 4.7 4.7 4.7  CL 90* 93* 92*  CO2 26 27 28   GLUCOSE 95 101* 94  BUN 80* 70* 51*  CREATININE 2.98* 2.52* 1.81*  CALCIUM 9.0 9.2 9.1   LFT Recent Labs    01/22/21 0905 01/23/21 0017  PROT 8.5* 7.6  ALBUMIN 4.6 4.4  AST 176* 200*  ALT 58* 67*  ALKPHOS 66 58  BILITOT 1.1 1.3*  BILIDIR 0.3*  --   IBILI 0.8  --      Medications: I have reviewed the patient's current  medications. Prior to Admission:  No medications prior to admission.   Scheduled:  chlorhexidine  15 mL Mouth Rinse BID   feeding supplement  1 Container Oral BID BM   hydrocortisone cream   Topical BID   mouth rinse  15 mL Mouth Rinse q12n4p   multivitamin with minerals  1 tablet Oral Daily   nicotine  14 mg Transdermal Daily   pantoprazole (PROTONIX) IV  40 mg Intravenous QHS   scopolamine  1 patch Transdermal Q72H    Assessment/Plan: 1) Intractable nausea, vomiting, hematemesis with abdominal probably secondary to cannabis use-improved significantly since with-patient has been counseled against the use of marijuana and the complications related with hyperemesis syndrome.  2) Elevated liver enzymes-agree agree with checking Hepatitis serologies.  3) Rhabdomyolysis-etiology unclear-CK's improving slowly. 4) Hypophosphatemia-improving. 5) ARF-creatinine is 1.09 today. 6) Bipolar disorder/impulsive behavior.   LOS: 3 days   01/25/21 01/24/2021, 7:21 AM

## 2021-01-25 LAB — HEPATITIS PANEL, ACUTE
HCV Ab: NONREACTIVE
Hep A IgM: NONREACTIVE
Hep B C IgM: NONREACTIVE
Hepatitis B Surface Ag: NONREACTIVE

## 2021-01-25 LAB — CBC WITH DIFFERENTIAL/PLATELET
Abs Immature Granulocytes: 0.03 10*3/uL (ref 0.00–0.07)
Basophils Absolute: 0 10*3/uL (ref 0.0–0.1)
Basophils Relative: 0 %
Eosinophils Absolute: 0.1 10*3/uL (ref 0.0–0.5)
Eosinophils Relative: 1 %
HCT: 31.9 % — ABNORMAL LOW (ref 39.0–52.0)
Hemoglobin: 11.4 g/dL — ABNORMAL LOW (ref 13.0–17.0)
Immature Granulocytes: 0 %
Lymphocytes Relative: 12 %
Lymphs Abs: 1.2 10*3/uL (ref 0.7–4.0)
MCH: 34.9 pg — ABNORMAL HIGH (ref 26.0–34.0)
MCHC: 35.7 g/dL (ref 30.0–36.0)
MCV: 97.6 fL (ref 80.0–100.0)
Monocytes Absolute: 1 10*3/uL (ref 0.1–1.0)
Monocytes Relative: 10 %
Neutro Abs: 7.6 10*3/uL (ref 1.7–7.7)
Neutrophils Relative %: 77 %
Platelets: 134 10*3/uL — ABNORMAL LOW (ref 150–400)
RBC: 3.27 MIL/uL — ABNORMAL LOW (ref 4.22–5.81)
RDW: 11.3 % — ABNORMAL LOW (ref 11.5–15.5)
WBC: 9.9 10*3/uL (ref 4.0–10.5)
nRBC: 0 % (ref 0.0–0.2)

## 2021-01-25 LAB — COMPREHENSIVE METABOLIC PANEL
ALT: 90 U/L — ABNORMAL HIGH (ref 0–44)
AST: 170 U/L — ABNORMAL HIGH (ref 15–41)
Albumin: 4.7 g/dL (ref 3.5–5.0)
Alkaline Phosphatase: 56 U/L (ref 38–126)
Anion gap: 8 (ref 5–15)
BUN: 12 mg/dL (ref 6–20)
CO2: 27 mmol/L (ref 22–32)
Calcium: 9.5 mg/dL (ref 8.9–10.3)
Chloride: 102 mmol/L (ref 98–111)
Creatinine, Ser: 0.9 mg/dL (ref 0.61–1.24)
GFR, Estimated: >60 mL/min (ref >60)
Glucose, Bld: 113 mg/dL — ABNORMAL HIGH (ref 70–99)
Potassium: 4 mmol/L (ref 3.5–5.1)
Sodium: 137 mmol/L (ref 135–145)
Total Bilirubin: 0.9 mg/dL (ref 0.3–1.2)
Total Protein: 7.9 g/dL (ref 6.5–8.1)

## 2021-01-25 LAB — CK: Total CK: 4446 U/L — ABNORMAL HIGH (ref 49–397)

## 2021-01-25 LAB — MAGNESIUM: Magnesium: 2 mg/dL (ref 1.7–2.4)

## 2021-01-25 LAB — PHOSPHORUS: Phosphorus: 1 mg/dL — CL (ref 2.5–4.6)

## 2021-01-25 MED ORDER — SODIUM CHLORIDE 0.9 % IV BOLUS
1000.0000 mL | Freq: Once | INTRAVENOUS | Status: AC
  Administered 2021-01-25: 1000 mL via INTRAVENOUS

## 2021-01-25 MED ORDER — CALCIUM CARBONATE ANTACID 500 MG PO CHEW
400.0000 mg | CHEWABLE_TABLET | Freq: Two times a day (BID) | ORAL | Status: DC
  Administered 2021-01-26: 400 mg via ORAL
  Filled 2021-01-25: qty 2

## 2021-01-25 MED ORDER — FENTANYL CITRATE PF 50 MCG/ML IJ SOSY
12.5000 ug | PREFILLED_SYRINGE | INTRAMUSCULAR | Status: DC | PRN
  Administered 2021-01-25 – 2021-01-26 (×5): 12.5 ug via INTRAVENOUS
  Filled 2021-01-25 (×5): qty 1

## 2021-01-25 MED ORDER — ENOXAPARIN SODIUM 40 MG/0.4ML IJ SOSY
40.0000 mg | PREFILLED_SYRINGE | INTRAMUSCULAR | Status: DC
  Administered 2021-01-25: 40 mg via SUBCUTANEOUS
  Filled 2021-01-25: qty 0.4

## 2021-01-25 MED ORDER — CALCIUM CARBONATE ANTACID 500 MG PO CHEW
1.0000 | CHEWABLE_TABLET | Freq: Two times a day (BID) | ORAL | Status: DC
  Administered 2021-01-25: 200 mg via ORAL
  Filled 2021-01-25: qty 1

## 2021-01-25 MED ORDER — PANTOPRAZOLE SODIUM 40 MG PO TBEC
40.0000 mg | DELAYED_RELEASE_TABLET | Freq: Every day | ORAL | Status: DC
  Administered 2021-01-25: 40 mg via ORAL
  Filled 2021-01-25: qty 1

## 2021-01-25 MED ORDER — POTASSIUM PHOSPHATES 15 MMOLE/5ML IV SOLN
30.0000 mmol | Freq: Once | INTRAVENOUS | Status: AC
  Administered 2021-01-25: 30 mmol via INTRAVENOUS
  Filled 2021-01-25: qty 10

## 2021-01-25 NOTE — Progress Notes (Addendum)
UNASSIGNED PATIENT Subjective: Patient seems to be doing somewhat better today.  He denies having any abdominal pain nausea or nausea or vomiting.  He claims he had 1 black BM today but normal BMs thereafter.  His behavior is somewhat odd; he is squatting on the floor with his T-shirt wrapped over his knees and legs.  He continues to have some hiccups and on the medications given to him for his hiccups him to be helping. He was found to have a low phosphate this morning which is being repleted.  Objective: Vital signs in last 24 hours: Temp:  [97.6 F (36.4 C)-98.4 F (36.9 C)] 97.6 F (36.4 C) (08/28 0557) Pulse Rate:  [47-90] 90 (08/28 0557) Resp:  [18] 18 (08/27 2213) BP: (139-156)/(79-101) 139/79 (08/28 0357) SpO2:  [100 %] 100 % (08/28 0557)    Intake/Output from previous day: No intake/output data recorded. Intake/Output this shift: No intake/output data recorded.  General appearance: alert, cooperative, appears stated age, and no distress; he continues to have hiccups Resp: clear to auscultation bilaterally Cardio: regular rate and rhythm, S1, S2 normal, no murmur, click, rub or gallop GI: soft, non-tender; bowel sounds normal; no masses,  no organomegaly  Lab Results: Recent Labs    01/23/21 0017 01/24/21 0550 01/25/21 0523  WBC 10.5 8.9 9.9  HGB 11.9* 10.6* 11.4*  HCT 32.2* 29.5* 31.9*  PLT 143* 125* 134*   BMET Recent Labs    01/23/21 0839 01/24/21 0550 01/25/21 0523  NA 129* 137 137  K 4.7 4.5 4.0  CL 92* 103 102  CO2 28 27 27   GLUCOSE 94 116* 113*  BUN 51* 23* 12  CREATININE 1.81* 1.09 0.90  CALCIUM 9.1 9.0 9.5   LFT Recent Labs    01/25/21 0523  PROT 7.9  ALBUMIN 4.7  AST 170*  ALT 90*  ALKPHOS 56  BILITOT 0.9   PT/INR No results for input(s): LABPROT, INR in the last 72 hours. Hepatitis Panel Recent Labs    01/24/21 0550  HEPBSAG NON REACTIVE  HCVAB NON REACTIVE  HEPAIGM NON REACTIVE  HEPBIGM NON REACTIVE   Studies/Results: 01/26/21  Abdomen Limited RUQ (LIVER/GB)  Result Date: 01/24/2021 CLINICAL DATA:  35 year old male with abnormal LFTs. EXAM: ULTRASOUND ABDOMEN LIMITED RIGHT UPPER QUADRANT COMPARISON:  Noncontrast CT Abdomen and Pelvis 01/21/2021. FINDINGS: Gallbladder: No gallstones or wall thickening visualized. No sonographic Murphy sign noted by sonographer. Common bile duct: Diameter: 4 mm, normal. Liver: Mild rib shadowing. Echogenicity appears within normal limits. No discrete liver lesion. No intrahepatic biliary ductal dilatation. Portal vein is patent on color Doppler imaging with normal direction of blood flow towards the liver. Other: Negative visible right kidney.  No free fluid. IMPRESSION: Normal right upper quadrant ultrasound. Electronically Signed   By: 01/23/2021 M.D.   On: 01/24/2021 07:59    Medications: I have reviewed the patient's current medications. Prior to Admission:  No medications prior to admission.   Scheduled:  chlorhexidine  15 mL Mouth Rinse BID   feeding supplement  1 Container Oral BID BM   hydrocortisone cream   Topical BID   mouth rinse  15 mL Mouth Rinse q12n4p   multivitamin with minerals  1 tablet Oral Daily   nicotine  14 mg Transdermal Daily   pantoprazole (PROTONIX) IV  40 mg Intravenous QHS   scopolamine  1 patch Transdermal Q72H   Continuous:  sodium chloride 100 mL/hr at 01/25/21 0340   potassium PHOSPHATE IVPB (in mmol) 30 mmol (01/25/21 1351)  sodium chloride     ZOX:WRUEAVWU, chlorproMAZINE, fentaNYL (SUBLIMAZE) injection, hydrOXYzine, ondansetron (ZOFRAN) IV, prochlorperazine, white petrolatum  Assessment/Plan: 1) Intractable hiccups, nausea, vomiting, hematemesis with abdominal probably secondary to cannabis use-improved significantly since with-patient has been counseled against the use of marijuana and the complications related with hyperemesis syndrome.  2) Elevated liver enzymes-Hepatitis serologies negative.  3) Rhabdomyolysis-etiology unclear-CK's improving  slowly. 4) Hypophosphatemia-being repleted 5) ARF-creatinine .09 today. 6) Bipolar disorder/impulsive behavior.      LOS: 4 days   Charna Elizabeth 01/25/2021, 9:32 AM

## 2021-01-25 NOTE — Progress Notes (Signed)
PROGRESS NOTE    Janan HalterDarius Pund  ZOX:[id number] DOB: 10-13-1985 DOA: 01/21/2021 PCP: Patient, No Pcp Per (Inactive)   Brief Narrative:  The patient is a 35 year old African-American male with a past medical history significant for but not limited to cyclical vomiting syndrome, history of cannabis use, history of bipolar disorder type I, GERD as well as other comorbidities who presented with intractable nausea vomiting.  Patient reported that about 5 days ago he began to have persistent nausea vomiting and noticed that his vomitus has been brown with some blood-tinge.  He thought it was due to some cookies that he ate at that time.  Denies the cookies being laced with anything.  He started reporting diffuse abdominal pain but no fevers and denies any diarrhea.  He not been able to eat or drink for at least 5 days and unable to produce urine for the last 5 days.  Also noted to have low back pain and endorses tobacco and marijuana use though he states about 12 days ago since his last use.  Denies any alcohol.  Because of his intractable nausea and vomiting as well as abdominal pain he presented to the hospital.  He initially presented [facility] and had a sodium of 123 but left and later arrived here less along with repeat sodium 117.  He was noted to have a K of 5.1 and a creatinine 11.8 no recent comparison for admission.  CT renal stone study was done was relatively unremarkable.  He was admitted for his intractable nausea vomiting likely in the setting of viral gastroenteritis versus hyperemesis syndrome in the setting of cannabis use.  He was placed on antiemetics with Zofran.  He had some hematemesis and noted some coffee-ground emesis but likely from Mallory-Weiss tear.  Given that his hemoglobin is stable he started on daily PPI.  His sodium was low on admission and is now improving with IV fluid hydration but worsened again along with his Renal Fxn. Will have GI evaluate for his intractable Nausea  and Vomiting and have Nephrology evaluate for his Renal Failure however after further review and improvement in his creatinine we will cancel the nephrology consultation given that his BUN/creatinine is trending down with IV fluid hydration.  Patient's rhabdomyolysis is also improving with IV fluid hydration he will be given additional fluid boluses  Patient continues to be nauseous and continues to have hiccups and the baclofen does not seem to get sorted so we will try Thorazine. His Phos was low so will replete with IV Kphos 30 mmol.   Assessment & Plan:   Principal Problem:   Hyponatremia Active Problems:   Bipolar 1 disorder (HCC)   Intractable nausea and vomiting   Acute renal failure (ARF) (HCC)   Metabolic acidosis, increased anion gap (IAG)  Intractable nausea and vomiting and abdominal pain, improving slowly  -Admitted to the progressive care unit as inpatient -Currently unclear etiology that this could be viral gastroenteritis versus aberrant emesis cannabis syndrome; he does have a history of cyclical vomiting syndrome -UDS was checked and was positive for THC -Continue with supportive care and IV fluid hydration; he received multiple IV fluid boluses and will be getting another 1 L today and will continue his maintenance IV fluid hydration and increased it to 100 mL/h with normal saline -Continue with antiemetics with IV Zofran 4 mg every 6 as needed for nausea vomiting; also received metoclopramide 10 mg IV x1; also added Compazine 10 mg every 6 as needed for refractory  nausea vomiting as well as a scopolamine patch -Patient was given capsaicin cream on the abdomen topically 0.075% on admission -Changing his pain regimen for the abdominal pain from fentanyl 25 mcg every 2 hours as needed for severe pain to 12.5 mg every 4 hours as needed -Will get GI involved for his Intractable Nausea and Vomiting and they are recommending that the patient can take a hot shower and continue IV  fluid hydration.  Patient is clinically doing better and his AKI is improving.  GI does not feel that his hematemesis is of any significance given that his hemoglobin/hematocrit is relatively stable but it is dropping with IV fluid hydration likely dilutional effect -GI recommending avoidance of marijuana and I have recommended the complications related to cannabis hyperemesis syndrome  Rhabdomyolysis -Unclear etiology but he presented and had a CK of 10,864 -> 10,281 -> 10,086 -> 7,160 -> 4,446 -Ordered a heating pad for his soreness and abdominal soreness -Continued with IV fluid hydration but increased normal saline to 100 MLS per hour and he was given an additional 1 Liter Fluid Boluses today  -Continue monitor and trend and repeat CK in the AM   Hypophosphatemia -Patient's Phos Level was 1.0 -Replete with IV Sodium Phos 30 mmol yesterday and will replete with IV K Phos 30 mmol today  -Continue to Monitor and Replete as Necessary  -Repeat Phos Level in the AM   Hematemesis, improved -Patient presented with coffee-ground emesis but this is since resolved -Possibly from Mallory-Weiss tear from intractable nausea vomiting -He has actually having an erythrocytosis and now hemoglobin/hematocrit is trended down and went from 17.1/45.9 -> 15.5/41.2 -> 15.0/44.0 -> 13.3/35.3 has further trended down to 11.9/32.2 -> 10.6/29.5 -> 11.4/31.9 and stable  -Checked Anemia Panel and showed an iron level of 156, U IBC 156, TIBC 312, saturation ratios of 50%, ferritin level 215, ALT of 12.3, vitamin B12 415 -Continue to monitor for signs and symptoms of bleeding; -Will get GI Involved for his Hematemesis and they are not concerned about his hematemesis given his relatively stable hemoglobins  Hyponatremia, fluctuating -Acute and improved  -In the setting of hypovolemia from GI losses -Sodium is now relatively stable and is now improved to 137 -IVF hydration as above  Abnormal LFTs -In the setting of  rhabdomyolysis -ON presentation his AST was 138, ALT was 42 -At his peak, his  AST worsened to 200 and ALT was 78 at its peak and is now improving and AST is 170 and ALT is 90 -Continue monitor trend hepatic function carefully  -RUQ U/S was normal and Acute Hepatitis Panel not done yet -Repeat CMP in the a.m.  Leukocytosis, improved -Likely reactive in the setting of his nausea vomiting and poor p.o. intake given that he is likely hemoconcentrated and dehydrated -WBC on admission was 20.9 and repeat was 21.1 and is now 13.0 -> 10.5 -> 8.9 -> 9.9 -Continue monitor for signs and symptoms of infection; -Repeat CBC in the AM   Acute Renal Failure, improved  Elevated anion gap, resolved  Hyperphosphatemia -> Hypophosphatemia (see above)  -Suspect both prerenal and ATN possibly; has not had really any urine output reported for 5 days -Checking bladder scan, UA, urine sodium and creatinine -Urinalysis showed a cloudy appearance with large hemoglobin, few bacteria 21-50 RBCs per high-power field and 6-10 WBCs; urine sodium was 26, urine creatinine was 117.99 -His CT renal stone study done and there is no hydronephrosis noted or obstruction -Patient presented with a BUNs/creatinine of 117/11.42  and BUN/Cr trending down further and is now 12/0.90 -Patient also presented with an elevated anion gap of 33, CO2 of 23, chloride level of 67; now his anion gap is 8, chloride level is 102, CO2 is 27 -IV fluid hydration as above -Avoid further nephrotoxic medication, contrast dyes, hypotension and renally adjust medications -Currently no urgent indications for dialysis so we will hold off nephrology consultation -Patient's BUNs/creatinine is improving and will need to continue monitor renal function carefully and repeat BMPs every 4 and CMP in a.m.  Hx of Bipolar Disorder Type I -Patient denies taking any prescription medication   GERD -Continue with IV PPI pantoprazole 40 mg daily at  bedtime  Fall -Patient had a witnessed fall in the hospital as he is very impulsive and got out of bed. -Hit his chin on the door handle but had no other facial trauma or head injury -Will provide ice pack and he had a mild prescription so I have asked nursing to clean his wound -Placed on fall precautions -May need sitter if he continues to be impulsive; Last night he received IV Haldol because of his impulsiveness  Hiccups/Singultus -Tried Baclofen without Success -Will try Thorazine 10 mg TIDprn -Continues to still have some Hiccups   Tobacco Abuse -Smokes 1 pack/day -Smoking cessation counseling given -We will start on nicotine patch 14 mg transdermally  Face Rash -Improving -C/w Hydrocortisone Cream 1%  Hyperbilirubinemia -Patient's T bili has gone from 1.1 and trended up to 1.3 -> 1.4 -> 0.9 and resolved -Likely reactive -Continue monitor and trend and repeat CMP in a.m.  Thrombocytopenia -Patient's platelet count went from 154 -> 143 -> 125 -> 134 and improving again  -Likely dilutional drop in the setting of a lot of fluid that he is gone -Continue to monitor for signs and symptoms of Bleeding; no overt bleeding noted -Repeat CBC in a.m.  DVT prophylaxis: SCDs Code Status: FULL CODE Family Communication: No family present at bedside Disposition Plan: Pending further clinical improvement and improvement in his renal function back to baseline and anticipating D/C home in the next 24-48 hours  Status is: Inpatient  Remains inpatient appropriate because:Unsafe d/c plan, IV treatments appropriate due to intensity of illness or inability to take PO, and Inpatient level of care appropriate due to severity of illness  Dispo: The patient is from: Home              Anticipated d/c is to: Home              Patient currently is not medically stable to d/c.   Difficult to place patient No  Consultants:  Gastroenterology Nephrology   Procedures:  None  Antimicrobials: Anti-infectives (From admission, onward)    None        Subjective: Seen and examined at bedside and his renal function has normalized.  He continues to have hiccups overnight as a bed.  Thinks he is doing better.  Has no abdominal pain today.  Nausea is improved.  No chest pain or shortness of breath.  No other concerns or complaints at this time.  Objective: Vitals:   01/24/21 1400 01/24/21 2213 01/25/21 0357 01/25/21 0557  BP: (!) 156/93 (!) 142/101 139/79   Pulse: (!) 56 (!) 52 (!) 47 90  Resp: 18 18    Temp:  98.4 F (36.9 C)  97.6 F (36.4 C)  TempSrc:  Oral  Oral  SpO2: 100% 100%  100%  Weight:      Height:  Intake/Output Summary (Last 24 hours) at 01/25/2021 1144 Last data filed at 01/24/2021 1716 Gross per 24 hour  Intake 0 ml  Output --  Net 0 ml    Filed Weights   01/21/21 1944 01/21/21 1950  Weight: 70.3 kg 70.3 kg   Examination: Physical Exam:  Constitutional: Thin African-American male currently no acute distress appears calm and is not as nauseous but still having some mild hiccups Eyes: Lids and conjunctivae normal, sclerae anicteric  ENMT: External Ears, Nose appear normal. Grossly normal hearing.  Neck: Appears normal, supple, no cervical masses, normal ROM, no appreciable thyromegaly; no JVD Respiratory: Diminished to auscultation bilaterally, no wheezing, rales, rhonchi or crackles. Normal respiratory effort and patient is not tachypenic. No accessory muscle use.  Unlabored breathing Cardiovascular: RRR, no murmurs / rubs / gallops. S1 and S2 auscultated. No extremity edema.  Abdomen: Soft, non-tender, non-distended. Bowel sounds positive.  GU: Deferred. Musculoskeletal: No clubbing / cyanosis of digits/nails. No joint deformity upper and lower extremities.  Skin: No rashes, lesions, ulcers on limited skin evaluation. No induration; Warm and dry.  Neurologic: CN 2-12 grossly intact with no focal deficits but continues  to have some mild hiccups. Romberg sign and cerebellar reflexes not assessed.  Psychiatric: Normal judgment and insight. Alert and oriented x 3. Normal mood and appropriate affect.   Data Reviewed: I have personally reviewed following labs and imaging studies  CBC: Recent Labs  Lab 01/21/21 1955 01/21/21 2041 01/22/21 0905 01/23/21 0017 01/24/21 0550 01/25/21 0523  WBC 21.1*  --  13.0* 10.5 8.9 9.9  NEUTROABS 18.3*  --  10.7* 8.2* 6.8 7.6  HGB 15.5 15.0 13.3 11.9* 10.6* 11.4*  HCT 41.2 44.0 35.3* 32.2* 29.5* 31.9*  MCV 90.4  --  91.0 93.3 95.5 97.6  PLT 193  --  154 143* 125* 134*    Basic Metabolic Panel: Recent Labs  Lab 01/21/21 1955 01/21/21 2041 01/22/21 0905 01/22/21 1242 01/22/21 2107 01/23/21 0017 01/23/21 0839 01/24/21 0550 01/25/21 0523  NA 124*   < > 123*   < > 128* 130* 129* 137 137  K 4.6   < > 4.0   < > 4.7 4.7 4.7 4.5 4.0  CL 71*   < > 80*   < > 90* 93* 92* 103 102  CO2 22   < > 27   < > 26 27 28 27 27   GLUCOSE 112*   < > 107*   < > 95 101* 94 116* 113*  BUN 123*   < > 111*   < > 80* 70* 51* 23* 12  CREATININE 10.09*   < > 6.43*   < > 2.98* 2.52* 1.81* 1.09 0.90  CALCIUM 9.4   < > 9.4   < > 9.0 9.2 9.1 9.0 9.5  MG 2.7*  --  2.8*  --   --  2.9*  --  2.3 2.0  PHOS  --   --  6.6*  --   --  2.5  --  1.1* 1.0*   < > = values in this interval not displayed.    GFR: Estimated Creatinine Clearance: 113.9 mL/min (by C-G formula based on SCr of 0.9 mg/dL). Liver Function Tests: Recent Labs  Lab 01/21/21 1955 01/22/21 0905 01/23/21 0017 01/24/21 0550 01/25/21 0523  AST 143* 176* 200* 193* 170*  ALT 46* 58* 67* 78* 90*  ALKPHOS 75 66 58 50 56  BILITOT 1.1 1.1 1.3* 1.4* 0.9  PROT 9.4* 8.5* 7.6 7.1 7.9  ALBUMIN 5.0 4.6 4.4 4.2 4.7    Recent Labs  Lab 01/21/21 1321 01/21/21 1955  LIPASE 26 26    No results for input(s): AMMONIA in the last 168 hours. Coagulation Profile: No results for input(s): INR, PROTIME in the last 168 hours. Cardiac  Enzymes: Recent Labs  Lab 01/21/21 1955 01/22/21 0905 01/23/21 0017 01/24/21 0550 01/25/21 0523  CKTOTAL 10,864* 10,281* 10,086* 7,160* 4,446*   BNP (last 3 results) No results for input(s): PROBNP in the last 8760 hours. HbA1C: No results for input(s): HGBA1C in the last 72 hours. CBG: No results for input(s): GLUCAP in the last 168 hours. Lipid Profile: No results for input(s): CHOL, HDL, LDLCALC, TRIG, CHOLHDL, LDLDIRECT in the last 72 hours. Thyroid Function Tests: No results for input(s): TSH, T4TOTAL, FREET4, T3FREE, THYROIDAB in the last 72 hours. Anemia Panel: Recent Labs    01/24/21 0550  VITAMINB12 415  FOLATE 12.3  FERRITIN 215  TIBC 312  IRON 156  RETICCTPCT 0.8    Sepsis Labs: No results for input(s): PROCALCITON, LATICACIDVEN in the last 168 hours.  Recent Results (from the past 240 hour(s))  Resp Panel by RT-PCR (Flu A&B, Covid) Nasopharyngeal Swab     Status: None   Collection Time: 01/21/21 10:12 PM   Specimen: Nasopharyngeal Swab; Nasopharyngeal(NP) swabs in vial transport medium  Result Value Ref Range Status   SARS Coronavirus 2 by RT PCR NEGATIVE NEGATIVE Final    Comment: (NOTE) SARS-CoV-2 target nucleic acids are NOT DETECTED.  The SARS-CoV-2 RNA is generally detectable in upper respiratory specimens during the acute phase of infection. The lowest concentration of SARS-CoV-2 viral copies this assay can detect is 138 copies/mL. A negative result does not preclude SARS-Cov-2 infection and should not be used as the sole basis for treatment or other patient management decisions. A negative result may occur with  improper specimen collection/handling, submission of specimen other than nasopharyngeal swab, presence of viral mutation(s) within the areas targeted by this assay, and inadequate number of viral copies(<138 copies/mL). A negative result must be combined with clinical observations, patient history, and epidemiological information. The  expected result is Negative.  Fact Sheet for Patients:  BloggerCourse.com  Fact Sheet for Healthcare Providers:  SeriousBroker.it  This test is no t yet approved or cleared by the [place] FDA and  has been authorized for detection and/or diagnosis of SARS-CoV-2 by FDA under an Emergency Use Authorization (EUA). This EUA will remain  in effect (meaning this test can be used) for the duration of the COVID-19 declaration under Section 564(b)(1) of the Act, 21 U.S.C.section 360bbb-3(b)(1), unless the authorization is terminated  or revoked sooner.       Influenza A by PCR NEGATIVE NEGATIVE Final   Influenza B by PCR NEGATIVE NEGATIVE Final    Comment: (NOTE) The Xpert Xpress SARS-CoV-2/FLU/RSV plus assay is intended as an aid in the diagnosis of influenza from Nasopharyngeal swab specimens and should not be used as a sole basis for treatment. Nasal washings and aspirates are unacceptable for Xpert Xpress SARS-CoV-2/FLU/RSV testing.  Fact Sheet for Patients: BloggerCourse.com  Fact Sheet for Healthcare Providers: SeriousBroker.it  This test is not yet approved or cleared by the [place] FDA and has been authorized for detection and/or diagnosis of SARS-CoV-2 by FDA under an Emergency Use Authorization (EUA). This EUA will remain in effect (meaning this test can be used) for the duration of the COVID-19 declaration under Section 564(b)(1) of the Act, 21 U.S.C. section 360bbb-3(b)(1), unless the authorization  is terminated or revoked.  Performed at [facility], [address], [place], [place] [address]     RN Pressure Injury Documentation:     Estimated body mass index is 19.9 kg/m as calculated from the following:   Height as of this encounter:  (1.88 m).   Weight as of this encounter: 70.3 kg.  Malnutrition Type: Nutrition Problem:  Inadequate oral intake Etiology: acute illness, nausea, vomiting Malnutrition Characteristics: Signs/Symptoms: per patient/family report Nutrition Interventions: Interventions: Boost Breeze, Prostat, MVI Radiology Studies: US Abdomen Limited RUQ (LIVER/GB)  Result Date: 01/24/2021 CLINICAL DATA:  35 year old male with abnormal LFTs. EXAM: ULTRASOUND ABDOMEN LIMITED RIGHT UPPER QUADRANT COMPARISON:  Noncontrast CT Abdomen and Pelvis 01/21/2021. FINDINGS: Gallbladder: No gallstones or wall thickening visualized. No sonographic Murphy sign noted by sonographer. Common bile duct: Diameter: 4 mm, normal. Liver: Mild rib shadowing. Echogenicity appears within normal limits. No discrete liver lesion. No intrahepatic biliary ductal dilatation. Portal vein is patent on color Doppler imaging with normal direction of blood flow towards the liver. Other: Negative visible right kidney.  No free fluid. IMPRESSION: Normal right upper quadrant ultrasound. Electronically Signed   By: Odessa Fleming M.D.   On: 01/24/2021 07:59    Scheduled Meds:  chlorhexidine  15 mL Mouth Rinse BID   feeding supplement  1 Container Oral BID BM   hydrocortisone cream   Topical BID   mouth rinse  15 mL Mouth Rinse q12n4p   multivitamin with minerals  1 tablet Oral Daily   nicotine  14 mg Transdermal Daily   pantoprazole (PROTONIX) IV  40 mg Intravenous QHS   scopolamine  1 patch Transdermal Q72H   Continuous Infusions:  sodium chloride 100 mL/hr at 01/25/21 0340   potassium PHOSPHATE IVPB (in mmol)      LOS: 4 days   Merlene Laughter, DO [facility] PAGER is on AMION  If 7PM-7AM, please contact night-coverage [email or web address]

## 2021-01-25 NOTE — Progress Notes (Signed)
Patient had a critical lab result. Patient's phosphorus resulted at 1. Physician paged and made aware.

## 2021-01-26 LAB — COMPREHENSIVE METABOLIC PANEL
ALT: 71 U/L — ABNORMAL HIGH (ref 0–44)
ALT: 67 U/L — ABNORMAL HIGH (ref 0–44)
AST: 96 U/L — ABNORMAL HIGH (ref 15–41)
AST: 74 U/L — ABNORMAL HIGH (ref 15–41)
Albumin: 4.1 g/dL (ref 3.5–5.0)
Albumin: 4.1 g/dL (ref 3.5–5.0)
Alkaline Phosphatase: 48 U/L (ref 38–126)
Alkaline Phosphatase: 48 U/L (ref 38–126)
Anion gap: 6 (ref 5–15)
Anion gap: 5 (ref 5–15)
BUN: 12 mg/dL (ref 6–20)
BUN: 8 mg/dL (ref 6–20)
CO2: 25 mmol/L (ref 22–32)
CO2: 25 mmol/L (ref 22–32)
Calcium: 9.2 mg/dL (ref 8.9–10.3)
Calcium: 9.1 mg/dL (ref 8.9–10.3)
Chloride: 103 mmol/L (ref 98–111)
Chloride: 108 mmol/L (ref 98–111)
Creatinine, Ser: 0.88 mg/dL (ref 0.61–1.24)
Creatinine, Ser: 0.75 mg/dL (ref 0.61–1.24)
GFR, Estimated: >60 mL/min (ref >60)
GFR, Estimated: >60 mL/min (ref >60)
Glucose, Bld: 105 mg/dL — ABNORMAL HIGH (ref 70–99)
Glucose, Bld: 143 mg/dL — ABNORMAL HIGH (ref 70–99)
Potassium: 4.1 mmol/L (ref 3.5–5.1)
Potassium: 3.7 mmol/L (ref 3.5–5.1)
Sodium: 134 mmol/L — ABNORMAL LOW (ref 135–145)
Sodium: 138 mmol/L (ref 135–145)
Total Bilirubin: 0.6 mg/dL (ref 0.3–1.2)
Total Bilirubin: 0.5 mg/dL (ref 0.3–1.2)
Total Protein: 7 g/dL (ref 6.5–8.1)
Total Protein: 6.9 g/dL (ref 6.5–8.1)

## 2021-01-26 LAB — PHOSPHORUS
Phosphorus: 1.3 mg/dL — ABNORMAL LOW (ref 2.5–4.6)
Phosphorus: 2 mg/dL — ABNORMAL LOW (ref 2.5–4.6)

## 2021-01-26 LAB — CBC WITH DIFFERENTIAL/PLATELET
Abs Immature Granulocytes: 0.02 10*3/uL (ref 0.00–0.07)
Basophils Absolute: 0 10*3/uL (ref 0.0–0.1)
Basophils Relative: 0 %
Eosinophils Absolute: 0.1 10*3/uL (ref 0.0–0.5)
Eosinophils Relative: 1 %
HCT: 28.7 % — ABNORMAL LOW (ref 39.0–52.0)
Hemoglobin: 10.2 g/dL — ABNORMAL LOW (ref 13.0–17.0)
Immature Granulocytes: 0 %
Lymphocytes Relative: 17 %
Lymphs Abs: 1.6 10*3/uL (ref 0.7–4.0)
MCH: 33.9 pg (ref 26.0–34.0)
MCHC: 35.5 g/dL (ref 30.0–36.0)
MCV: 95.3 fL (ref 80.0–100.0)
Monocytes Absolute: 1 10*3/uL (ref 0.1–1.0)
Monocytes Relative: 12 %
Neutro Abs: 6.3 10*3/uL (ref 1.7–7.7)
Neutrophils Relative %: 70 %
Platelets: 120 10*3/uL — ABNORMAL LOW (ref 150–400)
RBC: 3.01 MIL/uL — ABNORMAL LOW (ref 4.22–5.81)
RDW: 11.2 % — ABNORMAL LOW (ref 11.5–15.5)
WBC: 9 10*3/uL (ref 4.0–10.5)
nRBC: 0 % (ref 0.0–0.2)

## 2021-01-26 LAB — MAGNESIUM
Magnesium: 1.6 mg/dL — ABNORMAL LOW (ref 1.7–2.4)
Magnesium: 2.2 mg/dL (ref 1.7–2.4)

## 2021-01-26 LAB — CK
Total CK: 2207 U/L — ABNORMAL HIGH (ref 49–397)
Total CK: 1498 U/L — ABNORMAL HIGH (ref 49–397)

## 2021-01-26 MED ORDER — ONDANSETRON 4 MG PO TBDP: 4.0000 mg | ORAL_TABLET | Freq: Three times a day (TID) | ORAL | 0 refills | Status: DC | PRN

## 2021-01-26 MED ORDER — PANTOPRAZOLE SODIUM 40 MG PO TBEC: 40.0000 mg | DELAYED_RELEASE_TABLET | Freq: Every day | ORAL | 0 refills | Status: DC

## 2021-01-26 MED ORDER — SODIUM PHOSPHATES 45 MMOLE/15ML IV SOLN
30.0000 mmol | Freq: Once | INTRAVENOUS | Status: AC
  Administered 2021-01-26: 30 mmol via INTRAVENOUS
  Filled 2021-01-26: qty 10

## 2021-01-26 MED ORDER — ADULT MULTIVITAMIN W/MINERALS CH: 1.0000 | ORAL_TABLET | Freq: Every day | ORAL | 0 refills | Status: DC

## 2021-01-26 MED ORDER — SODIUM CHLORIDE 0.9 % IV BOLUS
1000.0000 mL | Freq: Once | INTRAVENOUS | Status: AC
  Administered 2021-01-26: 1000 mL via INTRAVENOUS

## 2021-01-26 MED ORDER — K PHOS MONO-SOD PHOS DI & MONO 155-852-130 MG PO TABS
500.0000 mg | ORAL_TABLET | Freq: Once | ORAL | Status: AC
  Administered 2021-01-26: 500 mg via ORAL
  Filled 2021-01-26 (×2): qty 2

## 2021-01-26 MED ORDER — MAGNESIUM SULFATE 2 GM/50ML IV SOLN
2.0000 g | Freq: Once | INTRAVENOUS | Status: AC
  Administered 2021-01-26: 2 g via INTRAVENOUS
  Filled 2021-01-26: qty 50

## 2021-01-26 MED ORDER — NICOTINE 14 MG/24HR TD PT24: 14.0000 mg | MEDICATED_PATCH | Freq: Every day | TRANSDERMAL | 0 refills | Status: DC

## 2021-01-26 NOTE — Discharge Summary (Signed)
Physician Discharge Summary  Erik Cowan OEH:[id number] DOB: [birth year] DOA: 01/21/2021  PCP: Patient, No Pcp Per (Inactive)  Admit date: 01/21/2021 Discharge date: 01/26/2021  Admitted From: Home Disposition: Home  Recommendations for Outpatient Follow-up:  Follow up with PCP in 1-2 weeks Follow up with Gastroenterology within 1-2 weeks Please obtain CMP/CBC, Mag, Phos in one week Please follow up on the following pending results:  Home Health: No Equipment/Devices: None    Discharge Condition: Stable CODE STATUS: FULL CODE   Diet recommendation: Regular Diet   Brief/Interim Summary: The patient is a 35 year old African-American male with a past medical history significant for but not limited to cyclical vomiting syndrome, history of cannabis use, history of bipolar disorder type I, GERD as well as other comorbidities who presented with intractable nausea vomiting.  Patient reported that about 5 days ago he began to have persistent nausea vomiting and noticed that his vomitus has been brown with some blood-tinge.  He thought it was due to some cookies that he ate at that time.  Denies the cookies being laced with anything.  He started reporting diffuse abdominal pain but no fevers and denies any diarrhea.  He not been able to eat or drink for at least 5 days and unable to produce urine for the last 5 days.  Also noted to have low back pain and endorses tobacco and marijuana use though he states about 12 days ago since his last use.  Denies any alcohol.  Because of his intractable nausea and vomiting as well as abdominal pain he presented to the hospital.  He initially presented to [facility] and had a sodium of 123 but left and later arrived here less along with repeat sodium 117.  He was noted to have a K of 5.1 and a creatinine 11.8 no recent comparison for admission.  CT renal stone study was done was relatively unremarkable.  He was admitted for his intractable nausea vomiting likely in  the setting of viral gastroenteritis versus hyperemesis syndrome in the setting of cannabis use.  He was placed on antiemetics with Zofran.  He had some hematemesis and noted some coffee-ground emesis but likely from Mallory-Weiss tear.  Given that his hemoglobin is stable he started on daily PPI.  His sodium was low on admission and is now improving with IV fluid hydration but worsened again along with his Renal Fxn. Will have GI evaluate for his intractable Nausea and Vomiting and have Nephrology evaluate for his Renal Failure however after further review and improvement in his creatinine we will cancel the nephrology consultation given that his BUN/creatinine is trending down with IV fluid hydration.   Patient's rhabdomyolysis is also improving with IV fluid hydration he will be given additional fluid boluses  Patient continues to be nauseous and continues to have hiccups and the baclofen does not seem to get sorted so we will try Thorazine. His Phos was low so will replete with IV Kphos 30 mmol yesterday and will be replete with Sodium Phos 30 mmol today. Mag was also low so will give IV Mag Sulfate 2 grams.  Repeat labs this afternoon showed improvement in his sodium, magnesium and phosphorus.  His CK trended downward further.  He was deemed medically stable to be discharged and will be follow-up with his PCP and gastroenterology in outpatient setting.   Discharge Diagnoses:  Principal Problem:   Hyponatremia Active Problems:   Bipolar 1 disorder (HCC)   Intractable nausea and vomiting   Acute renal failure (ARF) (  HCC)   Metabolic acidosis, increased anion gap (IAG)   Intractable nausea and vomiting and abdominal pain, improved  -Admitted to the progressive care unit as inpatient -Currently unclear etiology that this could be viral gastroenteritis versus aberrant emesis cannabis syndrome; he does have a history of cyclical vomiting syndrome -UDS was checked and was positive for THC -Continue  with supportive care and IV fluid hydration; he received multiple IV fluid boluses and will be getting another 1 L today and will continue his maintenance IV fluid hydration and increased it to 100 mL/h with normal saline -Continue with antiemetics with IV Zofran 4 mg every 6 as needed for nausea vomiting; also received metoclopramide 10 mg IV x1; also added Compazine 10 mg every 6 as needed for refractory nausea vomiting as well as a scopolamine patch -Patient was given capsaicin cream on the abdomen topically 0.075% on admission -Changing his pain regimen for the abdominal pain from fentanyl 25 mcg every 2 hours as needed for severe pain to 12.5 mg every 4 hours as needed -Will get GI involved for his Intractable Nausea and Vomiting and they are recommending that the patient can take a hot shower and continue IV fluid hydration.  Patient is clinically doing better and his AKI is improving.  GI does not feel that his hematemesis is of any significance given that his hemoglobin/hematocrit is relatively stable but it is dropping with IV fluid hydration likely dilutional effect -GI recommending avoidance of marijuana and have recommended the complications related to cannabis hyperemesis syndrome -Follow up with GI as an outpatient    Rhabdomyolysis -Unclear etiology but he presented and had a CK of 10,864 -> 10,281 -> 10,086 -> 7,160 -> 4,446 -> 2207 -> 1498 -Ordered a heating pad for his soreness and abdominal soreness -Continued with IV fluid hydration but increased normal saline to 100 MLS per hour and he was given an additional 2 Liter Fluid Boluses today  -Continue monitor and trend and repeat CK within 1 week    Hypophosphatemia -Patient's Phos Level was 1.3 today -Replete with IV Sodium Phos 30 mmol the day before yesterday and will replete with IV K Phos 30 mmol yesterday and is now on IV Sodium Phos 30 mmol today   -Continue to Monitor and Replete as Necessary  -Repeat Phos Level this  afternoon was 2.0 so will give a dose of po K Phos 500 mg x1 prior to D/C -Repeat Phos Level with 1 week    Hematemesis, improved -Patient presented with coffee-ground emesis but this is since resolved -Possibly from Mallory-Weiss tear from intractable nausea vomiting -He has actually having an erythrocytosis and now hemoglobin/hematocrit is trended down and went from 17.1/45.9 -> 15.5/41.2 -> 15.0/44.0 -> 13.3/35.3 has further trended down to 11.9/32.2 -> 10.6/29.5 -> 11.4/31.9 -> 10.2/28.7 and relatively stable  -Checked Anemia Panel and showed an iron level of 156, U IBC 156, TIBC 312, saturation ratios of 50%, ferritin level 215, ALT of 12.3, vitamin B12 415 -Continue to monitor for signs and symptoms of bleeding; -Will get GI Involved for his Hematemesis and they are not concerned about his hematemesis given his relatively stable hemoglobin. They are recommending continued Observation   Hyponatremia, fluctuating -Acute and improved  -In the setting of hypovolemia from GI losses -Sodium is now relatively stable and had improved to 138 -IVF hydration as above   Abnormal LFTs -In the setting of rhabdomyolysis -ON presentation his AST was 138, ALT was 42 -At his peak, his  AST  worsened to 200 and ALT was 78 at its peak and is now improving and AST is 74 and ALT is 67 -Continue monitor trend hepatic function carefully  -RUQ U/S was normal and Acute Hepatitis Panel not done yet -Repeat CMP within 1 week    Leukocytosis, improved -Likely reactive in the setting of his nausea vomiting and poor p.o. intake given that he is likely hemoconcentrated and dehydrated -WBC on admission was 20.9 and repeat was 21.1 and is now 13.0 -> 10.5 -> 8.9 -> 9.9 -> 9.0  -Continue monitor for signs and symptoms of infection; -Repeat CBC within 1 week    Acute Renal Failure, improved  Elevated anion gap, resolved  Hyperphosphatemia -> Hypophosphatemia (see above)  -Suspect both prerenal and ATN possibly;  has not had really any urine output reported for 5 days -Checking bladder scan, UA, urine sodium and creatinine -Urinalysis showed a cloudy appearance with large hemoglobin, few bacteria 21-50 RBCs per high-power field and 6-10 WBCs; urine sodium was 26, urine creatinine was 117.99 -His CT renal stone study done and there is no hydronephrosis noted or obstruction -Patient presented with a BUNs/creatinine of 117/11.42 and BUN/Cr trending down further and is now 8/0.75 -Patient also presented with an elevated anion gap of 33, CO2 of 23, chloride level of 67; now his anion gap is 5, chloride level is 108, CO2 is 25 -IV fluid hydration as above -Avoid further nephrotoxic medication, contrast dyes, hypotension and renally adjust medications -Currently no urgent indications for dialysis so we will hold off nephrology consultation -Patient's BUNs/creatinine is improving and will need to continue monitor renal function carefully and repeat BMPs every 4 and CMP in a.m.   Hx of Bipolar Disorder Type I -Patient denies taking any prescription medication   GERD -Continued with IV PPI pantoprazole 40 mg daily at bedtime but changed to po and will D/C with it    Fall -Patient had a witnessed fall in the hospital as he is very impulsive and got out of bed. -Hit his chin on the door handle but had no other facial trauma or head injury -Will provide ice pack and he had a mild prescription so I have asked nursing to clean his wound -Placed on fall precautions -May need sitter if he continues to be impulsive; Last night he received IV Haldol because of his impulsiveness   Hiccups/Singultus -Tried Baclofen without Success -Will try Thorazine 10 mg TIDprn -Hiccups resolved    Tobacco Abuse -Smokes 1 pack/day -Smoking cessation counseling given -We will start on nicotine patch 14 mg transdermally   Face Rash -Improving -C/w Hydrocortisone Cream 1%   Hyperbilirubinemia -Patient's T bili has gone from  1.1 and trended up to 1.3 -> 1.4 -> 0.9 -> 0.6 -> 0.5 and resolved -Likely reactive -Continue monitor and trend and repeat CMP in a.m.  Thrombocytopenia -Patient's platelet count went from 154 -> 143 -> 125 -> 134 -> 120 -Likely dilutional drop in the setting of a lot of fluid that he is gone -Continue to monitor for signs and symptoms of Bleeding; no overt bleeding noted -Repeat CBC with 1 week   Discharge Instructions  Discharge Instructions     Call MD for:  difficulty breathing, headache or visual disturbances   Complete by: As directed    Call MD for:  extreme fatigue   Complete by: As directed    Call MD for:  hives   Complete by: As directed    Call MD for:  persistant dizziness  or light-headedness   Complete by: As directed    Call MD for:  persistant nausea and vomiting   Complete by: As directed    Call MD for:  redness, tenderness, or signs of infection (pain, swelling, redness, odor or green/yellow discharge around incision site)   Complete by: As directed    Call MD for:  severe uncontrolled pain   Complete by: As directed    Call MD for:  temperature >100.4   Complete by: As directed    Diet general   Complete by: As directed    Discharge instructions   Complete by: As directed    You were cared for by a hospitalist during your hospital stay. If you have any questions about your discharge medications or the care you received while you were in the hospital after you are discharged, you can call the unit and ask to speak with the hospitalist on call if the hospitalist that took care of you is not available. Once you are discharged, your primary care physician will handle any further medical issues. Please note that NO REFILLS for any discharge medications will be authorized once you are discharged, as it is imperative that you return to your primary care physician (or establish a relationship with a primary care physician if you do not have one) for your aftercare needs  so that they can reassess your need for medications and monitor your lab values.  Follow up with PCP and Gastroenterology as an outpatient. Take all medications as prescribed. If symptoms change or worsen please return to the ED for evaluation   Increase activity slowly   Complete by: As directed       Allergies as of 01/26/2021       Reactions   Gluten Meal Nausea And Vomiting   Stomach pains   Milk-related Compounds Nausea And Vomiting   Stomach upset   Latex Rash        Medication List     TAKE these medications    multivitamin with minerals Tabs tablet Take 1 tablet by mouth daily. Start taking on: January 27, 2021   nicotine 14 mg/24hr patch Commonly known as: NICODERM CQ - dosed in mg/24 hours Place 1 patch (14 mg total) onto the skin daily. Start taking on: January 27, 2021   ondansetron 4 MG disintegrating tablet Commonly known as: Zofran ODT Take 1 tablet (4 mg total) by mouth every 8 (eight) hours as needed for nausea or vomiting.   pantoprazole 40 MG tablet Commonly known as: PROTONIX Take 1 tablet (40 mg total) by mouth daily.        Allergies  Allergen Reactions   Gluten Meal Nausea And Vomiting    Stomach pains   Milk-Related Compounds Nausea And Vomiting    Stomach upset   Latex Rash    Consultations: Gastroenterology  Procedures/Studies: DG Chest Portable 1 View  Result Date: 01/21/2021 CLINICAL DATA:  Vomiting EXAM: PORTABLE CHEST 1 VIEW COMPARISON:  12/30/2015 FINDINGS: The heart size and mediastinal contours are within normal limits. Both lungs are clear. The visualized skeletal structures are unremarkable. IMPRESSION: Normal study. Electronically Signed   By: Charlett Nose M.D.   On: 01/21/2021 20:14   CT Renal Stone Study  Result Date: 01/21/2021 CLINICAL DATA:  Flank pain, kidney stone suspected. Nausea and vomiting. Symptoms began 4 days after eating chocolate chip cookies. EXAM: CT ABDOMEN AND PELVIS WITHOUT CONTRAST TECHNIQUE:  Multidetector CT imaging of the abdomen and pelvis was performed following the  standard protocol without IV contrast. COMPARISON:  04/04/2015 FINDINGS: Lower chest: Lung bases are clear. Hepatobiliary: No focal liver abnormality is seen. No gallstones, gallbladder wall thickening, or biliary dilatation. Pancreas: Unremarkable. No pancreatic ductal dilatation or surrounding inflammatory changes. Spleen: Normal in size without focal abnormality. Adrenals/Urinary Tract: Adrenal glands are unremarkable. Kidneys are normal, without renal calculi, focal lesion, or hydronephrosis. Bladder is unremarkable. Stomach/Bowel: Stomach, small bowel, and colon are not abnormally distended. Small bowel are mostly decompressed, limiting evaluation of the wall but no specific inflammatory changes are identified. Residual contrast material or opaque medication in the colon. Surgical absence of the appendix. Vascular/Lymphatic: No significant vascular findings are present. No enlarged abdominal or pelvic lymph nodes. Reproductive: Prostate is unremarkable. Other: No free air or free fluid in the abdomen. Abdominal wall musculature appears intact. Musculoskeletal: No acute bony abnormalities. IMPRESSION: No renal or ureteral stone or obstruction. No evidence of bowel obstruction or inflammation. Electronically Signed   By: Burman Nieves M.D.   On: 01/21/2021 21:23   US Abdomen Limited RUQ (LIVER/GB)  Result Date: 01/24/2021 CLINICAL DATA:  35 year old male with abnormal LFTs. EXAM: ULTRASOUND ABDOMEN LIMITED RIGHT UPPER QUADRANT COMPARISON:  Noncontrast CT Abdomen and Pelvis 01/21/2021. FINDINGS: Gallbladder: No gallstones or wall thickening visualized. No sonographic Murphy sign noted by sonographer. Common bile duct: Diameter: 4 mm, normal. Liver: Mild rib shadowing. Echogenicity appears within normal limits. No discrete liver lesion. No intrahepatic biliary ductal dilatation. Portal vein is patent on color Doppler imaging with  normal direction of blood flow towards the liver. Other: Negative visible right kidney.  No free fluid. IMPRESSION: Normal right upper quadrant ultrasound. Electronically Signed   By: Odessa Fleming M.D.   On: 01/24/2021 07:59     Subjective: Seen and examined at bedside and he is doing better today.  Laying in bed appeared comfortable and no nausea or vomiting.  CK is trending down further but he had electrolyte abnormalities so they were replete prior to discharging.  He denies any concerns or questions at this time and his nausea is improved as well as his hiccups.    Discharge Exam: Vitals:   01/25/21 2054 01/26/21 0405  BP: (!) 150/90 (!) 153/83  Pulse: 60 (!) 54  Resp: 20   Temp: 98.2 F (36.8 C) 98.2 F (36.8 C)  SpO2: 100% 100%   Vitals:   01/25/21 0557 01/25/21 1608 01/25/21 2054 01/26/21 0405  BP:  (!) 157/83 (!) 150/90 (!) 153/83  Pulse: 90 (!) 58 60 (!) 54  Resp:  18 20   Temp: 97.6 F (36.4 C) 98.6 F (37 C) 98.2 F (36.8 C) 98.2 F (36.8 C)  TempSrc: Oral Oral Oral Oral  SpO2: 100% 100% 100% 100%  Weight:      Height:       Examination: Physical Exam:   Constitutional: The patient is a thin African-American male currently no acute distress appears calm and is not as nauseous and hiccups seem to have resolved Eyes: Lids and conjunctivae normal, sclerae anicteric  ENMT: External Ears, Nose appear normal. Grossly normal hearing. Neck: Appears normal, supple, no cervical masses, normal ROM, no appreciable thyromegaly; no JVD Respiratory: Diminished to auscultation bilaterally, no wheezing, rales, rhonchi or crackles. Normal respiratory effort and patient is not tachypenic. No accessory muscle use.  Cardiovascular: RRR, no murmurs / rubs / gallops. S1 and S2 auscultated. Abdomen: Soft, non-tender, non-distended.  Bowel sounds positive.  GU: Deferred. Musculoskeletal: No clubbing / cyanosis of digits/nails. No joint deformity upper  and lower extremities.  Skin: No rashes,  lesions, ulcers or skin evaluation. No induration; Warm and dry.  Neurologic: CN 2-12 grossly intact with no focal deficits. Romberg sign and cerebellar reflexes not assessed.  Psychiatric: Normal judgment and insight. Alert and oriented x 3. Normal mood and appropriate affect.   The results of significant diagnostics from this hospitalization (including imaging, microbiology, ancillary and laboratory) are listed below for reference.    Microbiology: Recent Results (from the past 240 hour(s))  Resp Panel by RT-PCR (Flu A&B, Covid) Nasopharyngeal Swab     Status: None   Collection Time: 01/21/21 10:12 PM   Specimen: Nasopharyngeal Swab; Nasopharyngeal(NP) swabs in vial transport medium  Result Value Ref Range Status   SARS Coronavirus 2 by RT PCR NEGATIVE NEGATIVE Final    Comment: (NOTE) SARS-CoV-2 target nucleic acids are NOT DETECTED.  The SARS-CoV-2 RNA is generally detectable in upper respiratory specimens during the acute phase of infection. The lowest concentration of SARS-CoV-2 viral copies this assay can detect is 138 copies/mL. A negative result does not preclude SARS-Cov-2 infection and should not be used as the sole basis for treatment or other patient management decisions. A negative result may occur with  improper specimen collection/handling, submission of specimen other than nasopharyngeal swab, presence of viral mutation(s) within the areas targeted by this assay, and inadequate number of viral copies(<138 copies/mL). A negative result must be combined with clinical observations, patient history, and epidemiological information. The expected result is Negative.  Fact Sheet for Patients:  BloggerCourse.com  Fact Sheet for Healthcare Providers:  SeriousBroker.it  This test is no t yet approved or cleared by the [place] FDA and  has been authorized for detection and/or diagnosis of SARS-CoV-2 by FDA under an  Emergency Use Authorization (EUA). This EUA will remain  in effect (meaning this test can be used) for the duration of the COVID-19 declaration under Section 564(b)(1) of the Act, 21 U.S.C.section 360bbb-3(b)(1), unless the authorization is terminated  or revoked sooner.       Influenza A by PCR NEGATIVE NEGATIVE Final   Influenza B by PCR NEGATIVE NEGATIVE Final    Comment: (NOTE) The Xpert Xpress SARS-CoV-2/FLU/RSV plus assay is intended as an aid in the diagnosis of influenza from Nasopharyngeal swab specimens and should not be used as a sole basis for treatment. Nasal washings and aspirates are unacceptable for Xpert Xpress SARS-CoV-2/FLU/RSV testing.  Fact Sheet for Patients: BloggerCourse.com  Fact Sheet for Healthcare Providers: SeriousBroker.it  This test is not yet approved or cleared by the [place] FDA and has been authorized for detection and/or diagnosis of SARS-CoV-2 by FDA under an Emergency Use Authorization (EUA). This EUA will remain in effect (meaning this test can be used) for the duration of the COVID-19 declaration under Section 564(b)(1) of the Act, 21 U.S.C. section 360bbb-3(b)(1), unless the authorization is terminated or revoked.  Performed at [facility], [address], [place], [place] [address]      Labs: BNP (last 3 results) No results for input(s): BNP in the last 8760 hours. Basic Metabolic Panel: Recent Labs  Lab 01/23/21 0017 01/23/21 0839 01/24/21 0550 01/25/21 0523 01/26/21 0438 01/26/21 1627  NA 130* 129* 137 137 134* 138  K 4.7 4.7 4.5 4.0 4.1 3.7  CL 93* 92* 103 102 103 108  CO2 GLUCOSE 101* 94 116* 113* 105* 143*  BUN 70* 51* 23* CREATININE 2.52* 1.81* 1.09 0.90  0.88 0.75  CALCIUM 9.2 9.1 9.0 9.5 9.2 9.1  MG 2.9*  --  2.3 2.0 1.6* 2.2  PHOS 2.5  --  1.1* 1.0* 1.3* 2.0*   Liver Function Tests: Recent Labs  Lab  01/23/21 0017 01/24/21 0550 01/25/21 0523 01/26/21 0438 01/26/21 1627  AST 200* 193* 170* 96* 74*  ALT 67* 78* 90* 71* 67*  ALKPHOS 58 50 56 48 48  BILITOT 1.3* 1.4* 0.9 0.6 0.5  PROT 7.6 7.1 7.9 7.0 6.9  ALBUMIN 4.4 4.2 4.7 4.1 4.1   Recent Labs  Lab 01/21/21 1321 01/21/21 1955  LIPASE 26 26   No results for input(s): AMMONIA in the last 168 hours. CBC: Recent Labs  Lab 01/22/21 0905 01/23/21 0017 01/24/21 0550 01/25/21 0523 01/26/21 0438  WBC 13.0* 10.5 8.9 9.9 9.0  NEUTROABS 10.7* 8.2* 6.8 7.6 6.3  HGB 13.3 11.9* 10.6* 11.4* 10.2*  HCT 35.3* 32.2* 29.5* 31.9* 28.7*  MCV 91.0 93.3 95.5 97.6 95.3  PLT 154 143* 125* 134* 120*   Cardiac Enzymes: Recent Labs  Lab 01/23/21 0017 01/24/21 0550 01/25/21 0523 01/26/21 0438 01/26/21 1627  CKTOTAL 10,086* 7,160* 4,446* 2,207* 1,498*   BNP: Invalid input(s): POCBNP CBG: No results for input(s): GLUCAP in the last 168 hours. D-Dimer No results for input(s): DDIMER in the last 72 hours. Hgb A1c No results for input(s): HGBA1C in the last 72 hours. Lipid Profile No results for input(s): CHOL, HDL, LDLCALC, TRIG, CHOLHDL, LDLDIRECT in the last 72 hours. Thyroid function studies No results for input(s): TSH, T4TOTAL, T3FREE, THYROIDAB in the last 72 hours.  Invalid input(s): FREET3 Anemia work up Recent Labs    01/24/21 0550  VITAMINB12 415  FOLATE 12.3  FERRITIN 215  TIBC 312  IRON 156  RETICCTPCT 0.8   Urinalysis    Component Value Date/Time   COLORURINE YELLOW 01/21/2021 2239   APPEARANCEUR CLOUDY (A) 01/21/2021 2239   LABSPEC 1.014 01/21/2021 2239   PHURINE 5.0 01/21/2021 2239   GLUCOSEU NEGATIVE 01/21/2021 2239   HGBUR LARGE (A) 01/21/2021 2239   BILIRUBINUR NEGATIVE 01/21/2021 2239   KETONESUR NEGATIVE 01/21/2021 2239   PROTEINUR 100 (A) 01/21/2021 2239   UROBILINOGEN 0.2 03/12/2015 0341   NITRITE NEGATIVE 01/21/2021 2239   LEUKOCYTESUR NEGATIVE 01/21/2021 2239   Sepsis Labs Invalid  input(s): PROCALCITONIN,  WBC,  LACTICIDVEN Microbiology Recent Results (from the past 240 hour(s))  Resp Panel by RT-PCR (Flu A&B, Covid) Nasopharyngeal Swab     Status: None   Collection Time: 01/21/21 10:12 PM   Specimen: Nasopharyngeal Swab; Nasopharyngeal(NP) swabs in vial transport medium  Result Value Ref Range Status   SARS Coronavirus 2 by RT PCR NEGATIVE NEGATIVE Final    Comment: (NOTE) SARS-CoV-2 target nucleic acids are NOT DETECTED.  The SARS-CoV-2 RNA is generally detectable in upper respiratory specimens during the acute phase of infection. The lowest concentration of SARS-CoV-2 viral copies this assay can detect is 138 copies/mL. A negative result does not preclude SARS-Cov-2 infection and should not be used as the sole basis for treatment or other patient management decisions. A negative result may occur with  improper specimen collection/handling, submission of specimen other than nasopharyngeal swab, presence of viral mutation(s) within the areas targeted by this assay, and inadequate number of viral copies(<138 copies/mL). A negative result must be combined with clinical observations, patient history, and epidemiological information. The expected result is Negative.  Fact Sheet for Patients:  BloggerCourse.com  Fact Sheet for Healthcare Providers:  SeriousBroker.it  This test is  no t yet approved or cleared by the [place] and  has been authorized for detection and/or diagnosis of SARS-CoV-2 by FDA under an Emergency Use Authorization (EUA). This EUA will remain  in effect (meaning this test can be used) for the duration of the COVID-19 declaration under Section 564(b)(1) of the Act, 21 U.S.C.section 360bbb-3(b)(1), unless the authorization is terminated  or revoked sooner.       Influenza A by PCR NEGATIVE NEGATIVE Final   Influenza B by PCR NEGATIVE NEGATIVE Final    Comment: (NOTE) The Xpert  Xpress SARS-CoV-2/FLU/RSV plus assay is intended as an aid in the diagnosis of influenza from Nasopharyngeal swab specimens and should not be used as a sole basis for treatment. Nasal washings and aspirates are unacceptable for Xpert Xpress SARS-CoV-2/FLU/RSV testing.  Fact Sheet for Patients: BloggerCourse.com  Fact Sheet for Healthcare Providers: SeriousBroker.it  This test is not yet approved or cleared by the [place] FDA and has been authorized for detection and/or diagnosis of SARS-CoV-2 by FDA under an Emergency Use Authorization (EUA). This EUA will remain in effect (meaning this test can be used) for the duration of the COVID-19 declaration under Section 564(b)(1) of the Act, 21 U.S.C. section 360bbb-3(b)(1), unless the authorization is terminated or revoked.  Performed at [facility], [address], [place], [place] [address]    Time coordinating discharge: 35 minutes  SIGNED:  Merlene Laughter, DO [facility] 01/26/2021, 5:39 PM Pager is on AMION  If 7PM-7AM, please contact night-coverage [email or web address]

## 2021-01-26 NOTE — Progress Notes (Signed)
PROGRESS NOTE    Hallie Ishida  FKC:[id number] DOB: 10-07-1985 DOA: 01/21/2021 PCP: Patient, No Pcp Per (Inactive)   Brief Narrative:  The patient is a 35 year old African-American male with a past medical history significant for but not limited to cyclical vomiting syndrome, history of cannabis use, history of bipolar disorder type I, GERD as well as other comorbidities who presented with intractable nausea vomiting.  Patient reported that about 5 days ago he began to have persistent nausea vomiting and noticed that his vomitus has been brown with some blood-tinge.  He thought it was due to some cookies that he ate at that time.  Denies the cookies being laced with anything.  He started reporting diffuse abdominal pain but no fevers and denies any diarrhea.  He not been able to eat or drink for at least 5 days and unable to produce urine for the last 5 days.  Also noted to have low back pain and endorses tobacco and marijuana use though he states about 12 days ago since his last use.  Denies any alcohol.  Because of his intractable nausea and vomiting as well as abdominal pain he presented to the hospital.  He initially presented to [facility] and had a sodium of 123 but left and later arrived here less along with repeat sodium 117.  He was noted to have a K of 5.1 and a creatinine 11.8 no recent comparison for admission.  CT renal stone study was done was relatively unremarkable.  He was admitted for his intractable nausea vomiting likely in the setting of viral gastroenteritis versus hyperemesis syndrome in the setting of cannabis use.  He was placed on antiemetics with Zofran.  He had some hematemesis and noted some coffee-ground emesis but likely from Mallory-Weiss tear.  Given that his hemoglobin is stable he started on daily PPI.  His sodium was low on admission and is now improving with IV fluid hydration but worsened again along with his Renal Fxn. Will have GI evaluate for his intractable Nausea  and Vomiting and have Nephrology evaluate for his Renal Failure however after further review and improvement in his creatinine we will cancel the nephrology consultation given that his BUN/creatinine is trending down with IV fluid hydration.  Patient's rhabdomyolysis is also improving with IV fluid hydration he will be given additional fluid boluses  Patient continues to be nauseous and continues to have hiccups and the baclofen does not seem to get sorted so we will try Thorazine. His Phos was low so will replete with IV Kphos 30 mmol yesterday and will be replete with Sodium Phos 30 mmol today. Mag was also low so will give IV Mag Sulfate 2 grams   Assessment & Plan:   Principal Problem:   Hyponatremia Active Problems:   Bipolar 1 disorder (HCC)   Intractable nausea and vomiting   Acute renal failure (ARF) (HCC)   Metabolic acidosis, increased anion gap (IAG)  Intractable nausea and vomiting and abdominal pain, improving slowly  -Admitted to the progressive care unit as inpatient -Currently unclear etiology that this could be viral gastroenteritis versus aberrant emesis cannabis syndrome; he does have a history of cyclical vomiting syndrome -UDS was checked and was positive for THC -Continue with supportive care and IV fluid hydration; he received multiple IV fluid boluses and will be getting another 1 L today and will continue his maintenance IV fluid hydration and increased it to 100 mL/h with normal saline -Continue with antiemetics with IV Zofran 4 mg every 6  as needed for nausea vomiting; also received metoclopramide 10 mg IV x1; also added Compazine 10 mg every 6 as needed for refractory nausea vomiting as well as a scopolamine patch -Patient was given capsaicin cream on the abdomen topically 0.075% on admission -Changing his pain regimen for the abdominal pain from fentanyl 25 mcg every 2 hours as needed for severe pain to 12.5 mg every 4 hours as needed -Will get GI involved for his  Intractable Nausea and Vomiting and they are recommending that the patient can take a hot shower and continue IV fluid hydration.  Patient is clinically doing better and his AKI is improving.  GI does not feel that his hematemesis is of any significance given that his hemoglobin/hematocrit is relatively stable but it is dropping with IV fluid hydration likely dilutional effect -GI recommending avoidance of marijuana and have recommended the complications related to cannabis hyperemesis syndrome  Rhabdomyolysis -Unclear etiology but he presented and had a CK of 10,864 -> 10,281 -> 10,086 -> 7,160 -> 4,446 -> 2207 -Ordered a heating pad for his soreness and abdominal soreness -Continued with IV fluid hydration but increased normal saline to 100 MLS per hour and he was given an additional 2 Liter Fluid Boluses today  -Continue monitor and trend and repeat CK in the AM   Hypophosphatemia -Patient's Phos Level was 1.3 today -Replete with IV Sodium Phos 30 mmol the day before yesterday and will replete with IV K Phos 30 mmol yesterday and is now on IV Sodium Phos 30 mmol today   -Continue to Monitor and Replete as Necessary  -Repeat Phos Level in the AM   Hematemesis, improved -Patient presented with coffee-ground emesis but this is since resolved -Possibly from Mallory-Weiss tear from intractable nausea vomiting -He has actually having an erythrocytosis and now hemoglobin/hematocrit is trended down and went from 17.1/45.9 -> 15.5/41.2 -> 15.0/44.0 -> 13.3/35.3 has further trended down to 11.9/32.2 -> 10.6/29.5 -> 11.4/31.9 -> 10.2/28.7 and relatively stable  -Checked Anemia Panel and showed an iron level of 156, U IBC 156, TIBC 312, saturation ratios of 50%, ferritin level 215, ALT of 12.3, vitamin B12 415 -Continue to monitor for signs and symptoms of bleeding; -Will get GI Involved for his Hematemesis and they are not concerned about his hematemesis given his relatively stable hemoglobin. They are  recommending continued Observation  Hyponatremia, fluctuating -Acute and improved  -In the setting of hypovolemia from GI losses -Sodium is now relatively stable and had improved to 137 yesterday and is now 134 today  -IVF hydration as above  Abnormal LFTs -In the setting of rhabdomyolysis -ON presentation his AST was 138, ALT was 42 -At his peak, his  AST worsened to 200 and ALT was 78 at its peak and is now improving and AST is 96 and ALT is 71 -Continue monitor trend hepatic function carefully  -RUQ U/S was normal and Acute Hepatitis Panel not done yet -Repeat CMP in the a.m.  Leukocytosis, improved -Likely reactive in the setting of his nausea vomiting and poor p.o. intake given that he is likely hemoconcentrated and dehydrated -WBC on admission was 20.9 and repeat was 21.1 and is now 13.0 -> 10.5 -> 8.9 -> 9.9 -> 9.0 -Continue monitor for signs and symptoms of infection; -Repeat CBC in the AM   Acute Renal Failure, improved  Elevated anion gap, resolved  Hyperphosphatemia -> Hypophosphatemia (see above)  -Suspect both prerenal and ATN possibly; has not had really any urine output reported for 5 days -  Checking bladder scan, UA, urine sodium and creatinine -Urinalysis showed a cloudy appearance with large hemoglobin, few bacteria 21-50 RBCs per high-power field and 6-10 WBCs; urine sodium was 26, urine creatinine was 117.99 -His CT renal stone study done and there is no hydronephrosis noted or obstruction -Patient presented with a BUNs/creatinine of 117/11.42 and BUN/Cr trending down further and is now 12/0.88 -Patient also presented with an elevated anion gap of 33, CO2 of 23, chloride level of 67; now his anion gap is 6, chloride level is 103, CO2 is 25 -IV fluid hydration as above -Avoid further nephrotoxic medication, contrast dyes, hypotension and renally adjust medications -Currently no urgent indications for dialysis so we will hold off nephrology consultation -Patient's  BUNs/creatinine is improving and will need to continue monitor renal function carefully and repeat BMPs every 4 and CMP in a.m.  Hx of Bipolar Disorder Type I -Patient denies taking any prescription medication   GERD -Continue with IV PPI pantoprazole 40 mg daily at bedtime  Fall -Patient had a witnessed fall in the hospital as he is very impulsive and got out of bed. -Hit his chin on the door handle but had no other facial trauma or head injury -Will provide ice pack and he had a mild prescription so I have asked nursing to clean his wound -Placed on fall precautions -May need sitter if he continues to be impulsive; Last night he received IV Haldol because of his impulsiveness  Hiccups/Singultus -Tried Baclofen without Success -Will try Thorazine 10 mg TIDprn -Continues to still have some Hiccups   Tobacco Abuse -Smokes 1 pack/day -Smoking cessation counseling given -We will start on nicotine patch 14 mg transdermally  Face Rash -Improving -C/w Hydrocortisone Cream 1%  Hyperbilirubinemia -Patient's T bili has gone from 1.1 and trended up to 1.3 -> 1.4 -> 0.9 -> 0.6 and resolved -Likely reactive -Continue monitor and trend and repeat CMP in a.m.  Thrombocytopenia -Patient's platelet count went from 154 -> 143 -> 125 -> 134 -> 120 -Likely dilutional drop in the setting of a lot of fluid that he is gone -Continue to monitor for signs and symptoms of Bleeding; no overt bleeding noted -Repeat CBC in a.m.  DVT prophylaxis: SCDs Code Status: FULL CODE Family Communication: No family present at bedside Disposition Plan: Pending further clinical improvement and improvement in his renal function back to baseline and anticipating D/C home in the next 24-48 hours  Status is: Inpatient  Remains inpatient appropriate because:Unsafe d/c plan, IV treatments appropriate due to intensity of illness or inability to take PO, and Inpatient level of care appropriate due to severity of  illness  Dispo: The patient is from: Home              Anticipated d/c is to: Home              Patient currently is not medically stable to d/c.   Difficult to place patient No  Consultants:  Gastroenterology Nephrology   Procedures: None  Antimicrobials: Anti-infectives (From admission, onward)    None        Subjective: Seen and examined at bedside and he is doing better today.  Laying in bed appeared comfortable and no nausea or vomiting.  CK is trending down further but he had significant electrolyte abnormalities so will replete prior to discharging.  He denies any concerns or questions at this time and his nausea is improved as well as his hiccups.  Objective: Vitals:   01/25/21 0557 01/25/21  1608 01/25/21 2054 01/26/21 0405  BP:  (!) 157/83 (!) 150/90 (!) 153/83  Pulse: 90 (!) 58 60 (!) 54  Resp:  18 20   Temp: 97.6 F (36.4 C) 98.6 F (37 C) 98.2 F (36.8 C) 98.2 F (36.8 C)  TempSrc: Oral Oral Oral Oral  SpO2: 100% 100% 100% 100%  Weight:      Height:        Intake/Output Summary (Last 24 hours) at 01/26/2021 1450 Last data filed at 01/25/2021 1858 Gross per 24 hour  Intake 3036.56 ml  Output --  Net 3036.56 ml    Filed Weights   01/21/21 1944 01/21/21 1950  Weight: 70.3 kg 70.3 kg   Examination: Physical Exam:  Constitutional: The patient is a thin African-American male currently no acute distress appears calm and is not as nauseous and hiccups seem to have resolved Eyes: Lids and conjunctivae normal, sclerae anicteric  ENMT: External Ears, Nose appear normal. Grossly normal hearing. Neck: Appears normal, supple, no cervical masses, normal ROM, no appreciable thyromegaly; no JVD Respiratory: Diminished to auscultation bilaterally, no wheezing, rales, rhonchi or crackles. Normal respiratory effort and patient is not tachypenic. No accessory muscle use.  Cardiovascular: RRR, no murmurs / rubs / gallops. S1 and S2 auscultated. Abdomen: Soft,  non-tender, non-distended.  Bowel sounds positive.  GU: Deferred. Musculoskeletal: No clubbing / cyanosis of digits/nails. No joint deformity upper and lower extremities.  Skin: No rashes, lesions, ulcers or skin evaluation. No induration; Warm and dry.  Neurologic: CN 2-12 grossly intact with no focal deficits. Romberg sign and cerebellar reflexes not assessed.  Psychiatric: Normal judgment and insight. Alert and oriented x 3. Normal mood and appropriate affect.   Data Reviewed: I have personally reviewed following labs and imaging studies  CBC: Recent Labs  Lab 01/22/21 0905 01/23/21 0017 01/24/21 0550 01/25/21 0523 01/26/21 0438  WBC 13.0* 10.5 8.9 9.9 9.0  NEUTROABS 10.7* 8.2* 6.8 7.6 6.3  HGB 13.3 11.9* 10.6* 11.4* 10.2*  HCT 35.3* 32.2* 29.5* 31.9* 28.7*  MCV 91.0 93.3 95.5 97.6 95.3  PLT 154 143* 125* 134* 120*    Basic Metabolic Panel: Recent Labs  Lab 01/22/21 0905 01/22/21 1242 01/23/21 0017 01/23/21 0839 01/24/21 0550 01/25/21 0523 01/26/21 0438  NA 123*   < > 130* 129* 137 137 134*  K 4.0   < > 4.7 4.7 4.5 4.0 4.1  CL 80*   < > 93* 92* 103 102 103  CO2 27   < > 27 28 27 27 25   GLUCOSE 107*   < > 101* 94 116* 113* 105*  BUN 111*   < > 70* 51* 23* 12 12  CREATININE 6.43*   < > 2.52* 1.81* 1.09 0.90 0.88  CALCIUM 9.4   < > 9.2 9.1 9.0 9.5 9.2  MG 2.8*  --  2.9*  --  2.3 2.0 1.6*  PHOS 6.6*  --  2.5  --  1.1* 1.0* 1.3*   < > = values in this interval not displayed.    GFR: Estimated Creatinine Clearance: 116.5 mL/min (by C-G formula based on SCr of 0.88 mg/dL). Liver Function Tests: Recent Labs  Lab 01/22/21 0905 01/23/21 0017 01/24/21 0550 01/25/21 0523 01/26/21 0438  AST 176* 200* 193* 170* 96*  ALT 58* 67* 78* 90* 71*  ALKPHOS 66 58 50 56 48  BILITOT 1.1 1.3* 1.4* 0.9 0.6  PROT 8.5* 7.6 7.1 7.9 7.0  ALBUMIN 4.6 4.4 4.2 4.7 4.1    Recent Labs  Lab 01/21/21 1321 01/21/21 1955  LIPASE 26 26    No results for input(s): AMMONIA in the  last 168 hours. Coagulation Profile: No results for input(s): INR, PROTIME in the last 168 hours. Cardiac Enzymes: Recent Labs  Lab 01/22/21 0905 01/23/21 0017 01/24/21 0550 01/25/21 0523 01/26/21 0438  CKTOTAL 10,281* 10,086* 7,160* 4,446* 2,207*   BNP (last 3 results) No results for input(s): PROBNP in the last 8760 hours. HbA1C: No results for input(s): HGBA1C in the last 72 hours. CBG: No results for input(s): GLUCAP in the last 168 hours. Lipid Profile: No results for input(s): CHOL, HDL, LDLCALC, TRIG, CHOLHDL, LDLDIRECT in the last 72 hours. Thyroid Function Tests: No results for input(s): TSH, T4TOTAL, FREET4, T3FREE, THYROIDAB in the last 72 hours. Anemia Panel: Recent Labs    01/24/21 0550  VITAMINB12 415  FOLATE 12.3  FERRITIN 215  TIBC 312  IRON 156  RETICCTPCT 0.8    Sepsis Labs: No results for input(s): PROCALCITON, LATICACIDVEN in the last 168 hours.  Recent Results (from the past 240 hour(s))  Resp Panel by RT-PCR (Flu A&B, Covid) Nasopharyngeal Swab     Status: None   Collection Time: 01/21/21 10:12 PM   Specimen: Nasopharyngeal Swab; Nasopharyngeal(NP) swabs in vial transport medium  Result Value Ref Range Status   SARS Coronavirus 2 by RT PCR NEGATIVE NEGATIVE Final    Comment: (NOTE) SARS-CoV-2 target nucleic acids are NOT DETECTED.  The SARS-CoV-2 RNA is generally detectable in upper respiratory specimens during the acute phase of infection. The lowest concentration of SARS-CoV-2 viral copies this assay can detect is 138 copies/mL. A negative result does not preclude SARS-Cov-2 infection and should not be used as the sole basis for treatment or other patient management decisions. A negative result may occur with  improper specimen collection/handling, submission of specimen other than nasopharyngeal swab, presence of viral mutation(s) within the areas targeted by this assay, and inadequate number of viral copies(<138 copies/mL). A negative  result must be combined with clinical observations, patient history, and epidemiological information. The expected result is Negative.  Fact Sheet for Patients:  BloggerCourse.com  Fact Sheet for Healthcare Providers:  SeriousBroker.it  This test is no t yet approved or cleared by the [place] FDA and  has been authorized for detection and/or diagnosis of SARS-CoV-2 by FDA under an Emergency Use Authorization (EUA). This EUA will remain  in effect (meaning this test can be used) for the duration of the COVID-19 declaration under Section 564(b)(1) of the Act, 21 U.S.C.section 360bbb-3(b)(1), unless the authorization is terminated  or revoked sooner.       Influenza A by PCR NEGATIVE NEGATIVE Final   Influenza B by PCR NEGATIVE NEGATIVE Final    Comment: (NOTE) The Xpert Xpress SARS-CoV-2/FLU/RSV plus assay is intended as an aid in the diagnosis of influenza from Nasopharyngeal swab specimens and should not be used as a sole basis for treatment. Nasal washings and aspirates are unacceptable for Xpert Xpress SARS-CoV-2/FLU/RSV testing.  Fact Sheet for Patients: BloggerCourse.com  Fact Sheet for Healthcare Providers: SeriousBroker.it  This test is not yet approved or cleared by the [place] FDA and has been authorized for detection and/or diagnosis of SARS-CoV-2 by FDA under an Emergency Use Authorization (EUA). This EUA will remain in effect (meaning this test can be used) for the duration of the COVID-19 declaration under Section 564(b)(1) of the Act, 21 U.S.C. section 360bbb-3(b)(1), unless the authorization is terminated or revoked.  Performed at [facility], [address] [place], [place] [address]      RN Pressure Injury Documentation:     Estimated body mass index is 19.9 kg/m as calculated from the following:   Height as of this  encounter: 6\' 2"  (1.88 m).   Weight as of this encounter: 70.3 kg.  Malnutrition Type: Nutrition Problem: Inadequate oral intake Etiology: acute illness, nausea, vomiting Malnutrition Characteristics: Signs/Symptoms: per patient/family report Nutrition Interventions: Interventions: Boost Breeze, Prostat, MVI Radiology Studies: No results found.  Scheduled Meds:  calcium carbonate  400 mg of elemental calcium Oral BID WC   chlorhexidine  15 mL Mouth Rinse BID   enoxaparin (LOVENOX) injection  40 mg Subcutaneous Q24H   feeding supplement  1 Container Oral BID BM   hydrocortisone cream   Topical BID   mouth rinse  15 mL Mouth Rinse q12n4p   multivitamin with minerals  1 tablet Oral Daily   nicotine  14 mg Transdermal Daily   pantoprazole  40 mg Oral Daily   scopolamine  1 patch Transdermal Q72H   Continuous Infusions:  sodium chloride 100 mL/hr at 01/25/21 0340   sodium chloride     sodium phosphate  Dextrose 5% IVPB 30 mmol (01/26/21 1319)    LOS: 5 days   01/28/21, DO [facility] PAGER is on AMION  If 7PM-7AM, please contact night-coverage [email or web address]

## 2022-01-21 ENCOUNTER — Inpatient Hospital Stay (HOSPITAL_COMMUNITY)
Admission: EM | Admit: 2022-01-21 | Discharge: 2022-01-29 | DRG: 896 | Disposition: A | Discharge status: Home / Self Care | Payer: Self-pay | Attending: Family Medicine | Admitting: Family Medicine

## 2022-01-21 DIAGNOSIS — F1910 Other psychoactive substance abuse, uncomplicated: Secondary | ICD-10-CM

## 2022-01-21 DIAGNOSIS — G9341 Metabolic encephalopathy: Secondary | ICD-10-CM | POA: Diagnosis present

## 2022-01-21 DIAGNOSIS — Z781 Physical restraint status: Secondary | ICD-10-CM

## 2022-01-21 DIAGNOSIS — R109 Unspecified abdominal pain: Secondary | ICD-10-CM

## 2022-01-21 DIAGNOSIS — R45851 Suicidal ideations: Secondary | ICD-10-CM | POA: Diagnosis present

## 2022-01-21 DIAGNOSIS — Z79899 Other long term (current) drug therapy: Secondary | ICD-10-CM

## 2022-01-21 DIAGNOSIS — F1510 Other stimulant abuse, uncomplicated: Secondary | ICD-10-CM | POA: Diagnosis present

## 2022-01-21 DIAGNOSIS — R112 Nausea with vomiting, unspecified: Secondary | ICD-10-CM | POA: Diagnosis present

## 2022-01-21 DIAGNOSIS — K219 Gastro-esophageal reflux disease without esophagitis: Secondary | ICD-10-CM | POA: Diagnosis present

## 2022-01-21 DIAGNOSIS — Z8249 Family history of ischemic heart disease and other diseases of the circulatory system: Secondary | ICD-10-CM

## 2022-01-21 DIAGNOSIS — E440 Moderate protein-calorie malnutrition: Secondary | ICD-10-CM | POA: Diagnosis present

## 2022-01-21 DIAGNOSIS — N179 Acute kidney failure, unspecified: Secondary | ICD-10-CM | POA: Diagnosis present

## 2022-01-21 DIAGNOSIS — Z888 Allergy status to other drugs, medicaments and biological substances status: Secondary | ICD-10-CM

## 2022-01-21 DIAGNOSIS — Z681 Body mass index (BMI) 19 or less, adult: Secondary | ICD-10-CM

## 2022-01-21 DIAGNOSIS — K2970 Gastritis, unspecified, without bleeding: Secondary | ICD-10-CM | POA: Diagnosis present

## 2022-01-21 DIAGNOSIS — R4182 Altered mental status, unspecified: Secondary | ICD-10-CM | POA: Diagnosis present

## 2022-01-21 DIAGNOSIS — G894 Chronic pain syndrome: Secondary | ICD-10-CM | POA: Diagnosis present

## 2022-01-21 DIAGNOSIS — F19159 Other psychoactive substance abuse with psychoactive substance-induced psychotic disorder, unspecified: Principal | ICD-10-CM | POA: Diagnosis present

## 2022-01-21 DIAGNOSIS — F1310 Sedative, hypnotic or anxiolytic abuse, uncomplicated: Secondary | ICD-10-CM | POA: Diagnosis present

## 2022-01-21 DIAGNOSIS — R001 Bradycardia, unspecified: Secondary | ICD-10-CM | POA: Diagnosis present

## 2022-01-21 DIAGNOSIS — F1990 Other psychoactive substance use, unspecified, uncomplicated: Principal | ICD-10-CM

## 2022-01-21 DIAGNOSIS — Z91040 Latex allergy status: Secondary | ICD-10-CM

## 2022-01-21 DIAGNOSIS — F1210 Cannabis abuse, uncomplicated: Secondary | ICD-10-CM | POA: Diagnosis present

## 2022-01-21 DIAGNOSIS — M6282 Rhabdomyolysis: Secondary | ICD-10-CM | POA: Diagnosis present

## 2022-01-21 DIAGNOSIS — Z91018 Allergy to other foods: Secondary | ICD-10-CM

## 2022-01-21 DIAGNOSIS — F419 Anxiety disorder, unspecified: Secondary | ICD-10-CM | POA: Diagnosis present

## 2022-01-21 DIAGNOSIS — G4700 Insomnia, unspecified: Secondary | ICD-10-CM | POA: Diagnosis present

## 2022-01-21 DIAGNOSIS — Z91141 Patient's other noncompliance with medication regimen due to financial hardship: Secondary | ICD-10-CM

## 2022-01-21 DIAGNOSIS — E876 Hypokalemia: Secondary | ICD-10-CM | POA: Diagnosis present

## 2022-01-21 DIAGNOSIS — F319 Bipolar disorder, unspecified: Secondary | ICD-10-CM | POA: Diagnosis present

## 2022-01-21 DIAGNOSIS — Z560 Unemployment, unspecified: Secondary | ICD-10-CM

## 2022-01-21 DIAGNOSIS — R066 Hiccough: Secondary | ICD-10-CM | POA: Diagnosis present

## 2022-01-21 DIAGNOSIS — I10 Essential (primary) hypertension: Secondary | ICD-10-CM | POA: Diagnosis present

## 2022-01-21 DIAGNOSIS — F1410 Cocaine abuse, uncomplicated: Secondary | ICD-10-CM | POA: Diagnosis present

## 2022-01-21 DIAGNOSIS — R1115 Cyclical vomiting syndrome unrelated to migraine: Secondary | ICD-10-CM | POA: Diagnosis present

## 2022-01-21 DIAGNOSIS — Z91011 Allergy to milk products: Secondary | ICD-10-CM

## 2022-01-21 DIAGNOSIS — K5900 Constipation, unspecified: Secondary | ICD-10-CM | POA: Diagnosis present

## 2022-01-21 LAB — COMPREHENSIVE METABOLIC PANEL
ALT: 29 U/L (ref 0–44)
AST: 47 U/L — ABNORMAL HIGH (ref 15–41)
Albumin: 5.4 g/dL — ABNORMAL HIGH (ref 3.5–5.0)
Alkaline Phosphatase: 82 U/L (ref 38–126)
Anion gap: 19 — ABNORMAL HIGH (ref 5–15)
BUN: 27 mg/dL — ABNORMAL HIGH (ref 6–20)
CO2: 20 mmol/L — ABNORMAL LOW (ref 22–32)
Calcium: 11.2 mg/dL — ABNORMAL HIGH (ref 8.9–10.3)
Chloride: 98 mmol/L (ref 98–111)
Creatinine, Ser: 1.95 mg/dL — ABNORMAL HIGH (ref 0.61–1.24)
GFR, Estimated: 45 mL/min — ABNORMAL LOW (ref >60)
Glucose, Bld: 145 mg/dL — ABNORMAL HIGH (ref 70–99)
Potassium: 3.5 mmol/L (ref 3.5–5.1)
Sodium: 137 mmol/L (ref 135–145)
Total Bilirubin: 1 mg/dL (ref 0.3–1.2)
Total Protein: 9.2 g/dL — ABNORMAL HIGH (ref 6.5–8.1)

## 2022-01-21 LAB — CBC WITH DIFFERENTIAL/PLATELET
Abs Immature Granulocytes: 0.09 10*3/uL — ABNORMAL HIGH (ref 0.00–0.07)
Basophils Absolute: 0 10*3/uL (ref 0.0–0.1)
Basophils Relative: 0 %
Eosinophils Absolute: 0 10*3/uL (ref 0.0–0.5)
Eosinophils Relative: 0 %
HCT: 41.6 % (ref 39.0–52.0)
Hemoglobin: 14.8 g/dL (ref 13.0–17.0)
Immature Granulocytes: 0 %
Lymphocytes Relative: 5 %
Lymphs Abs: 1.1 10*3/uL (ref 0.7–4.0)
MCH: 33.8 pg (ref 26.0–34.0)
MCHC: 35.6 g/dL (ref 30.0–36.0)
MCV: 95 fL (ref 80.0–100.0)
Monocytes Absolute: 1.4 10*3/uL — ABNORMAL HIGH (ref 0.1–1.0)
Monocytes Relative: 7 %
Neutro Abs: 17.4 10*3/uL — ABNORMAL HIGH (ref 1.7–7.7)
Neutrophils Relative %: 88 %
Platelets: 187 10*3/uL (ref 150–400)
RBC: 4.38 MIL/uL (ref 4.22–5.81)
RDW: 11.6 % (ref 11.5–15.5)
WBC: 20 10*3/uL — ABNORMAL HIGH (ref 4.0–10.5)
nRBC: 0 % (ref 0.0–0.2)

## 2022-01-21 LAB — ETHANOL: Alcohol, Ethyl (B): <10 mg/dL (ref <10)

## 2022-01-21 LAB — CK: Total CK: 488 U/L — ABNORMAL HIGH (ref 49–397)

## 2022-01-21 LAB — MAGNESIUM: Magnesium: 1.8 mg/dL (ref 1.7–2.4)

## 2022-01-21 LAB — ACETAMINOPHEN LEVEL: Acetaminophen (Tylenol), Serum: <10 ug/mL — ABNORMAL LOW (ref 10–30)

## 2022-01-21 MED ORDER — SODIUM CHLORIDE 0.9 % IV BOLUS
1000.0000 mL | Freq: Once | INTRAVENOUS | Status: AC
  Administered 2022-01-21: 1000 mL via INTRAVENOUS

## 2022-01-21 MED ORDER — LORAZEPAM 2 MG/ML IJ SOLN
1.0000 mg | Freq: Once | INTRAMUSCULAR | Status: AC
  Administered 2022-01-21: 1 mg via INTRAVENOUS
  Filled 2022-01-21: qty 1

## 2022-01-21 MED ORDER — DROPERIDOL 2.5 MG/ML IJ SOLN
2.5000 mg | Freq: Once | INTRAMUSCULAR | Status: AC
  Administered 2022-01-21: 2.5 mg via INTRAVENOUS
  Filled 2022-01-21: qty 2

## 2022-01-21 MED ORDER — DIPHENHYDRAMINE HCL 50 MG/ML IJ SOLN
25.0000 mg | Freq: Once | INTRAMUSCULAR | Status: AC
  Administered 2022-01-22: 25 mg via INTRAVENOUS
  Filled 2022-01-21: qty 1

## 2022-01-21 MED ORDER — FENTANYL CITRATE PF 50 MCG/ML IJ SOSY
50.0000 ug | PREFILLED_SYRINGE | Freq: Once | INTRAMUSCULAR | Status: AC
  Administered 2022-01-21: 50 ug via INTRAVENOUS
  Filled 2022-01-21: qty 1

## 2022-01-21 MED ORDER — LACTATED RINGERS IV BOLUS
2000.0000 mL | Freq: Once | INTRAVENOUS | Status: AC
  Administered 2022-01-21: 1000 mL via INTRAVENOUS

## 2022-01-21 MED ORDER — HYDROMORPHONE HCL 1 MG/ML IJ SOLN
1.0000 mg | Freq: Once | INTRAMUSCULAR | Status: AC
  Administered 2022-01-21: 1 mg via INTRAVENOUS
  Filled 2022-01-21: qty 1

## 2022-01-21 MED ORDER — DROPERIDOL 2.5 MG/ML IJ SOLN
2.5000 mg | Freq: Once | INTRAMUSCULAR | Status: AC
Start: 2022-01-22 — End: 2022-01-22
  Administered 2022-01-22: 2.5 mg via INTRAVENOUS
  Filled 2022-01-21: qty 2

## 2022-01-21 NOTE — ED Provider Notes (Signed)
[facility] EMERGENCY DEPARTMENT Provider Note   CSN: [id number] Arrival date & time: 01/21/22  2039     History  Chief Complaint  Patient presents with   Abdominal Pain   psychosis   Emesis    Erik Cowan is a 36 y.o. male presented to ED with pain, nausea, vomiting.  The patient presents by EMS.  He is a very poor historian.  He is writhing on the floor, report he is having pain in his back.  He says he has been nauseous and vomiting and he fell in the bathtub and hit his back.  He also reports he has been "passing kidney stones" and says that he thinks he is passing more kidney stones.  He does have a history of bipolar type I disorder, cannabis use, cyclical vomiting.  He was admitted to the hospital exactly 1 year ago in August 2022 for cyclical vomiting, hematemesis likely from Mallory-Weiss tear.  He was started on a PPI at that time.  He also had an acute renal injury with infection improved after fluids management.  HPI     Home Medications Prior to Admission medications   Medication Sig Start Date End Date Taking? Authorizing Provider  Multiple Vitamin (MULTIVITAMIN WITH MINERALS) TABS tablet Take 1 tablet by mouth daily. 01/27/21   Sheikh, Omair Latif, DO  nicotine (NICODERM CQ - DOSED IN MG/24 HOURS) 14 mg/24hr patch Place 1 patch (14 mg total) onto the skin daily. 01/27/21   Marguerita Merles Latif, DO  ondansetron (ZOFRAN ODT) 4 MG disintegrating tablet Take 1 tablet (4 mg total) by mouth every 8 (eight) hours as needed for nausea or vomiting. 01/26/21   Marguerita Merles Latif, DO  pantoprazole (PROTONIX) 40 MG tablet Take 1 tablet (40 mg total) by mouth daily. 01/26/21   Marguerita Merles Latif, DO      Allergies    Gluten meal, Milk-related compounds, and Latex    Review of Systems   Review of Systems  Physical Exam Updated Vital Signs BP 135/76   Pulse 71   Resp 11   SpO2 94%  Physical Exam Constitutional:      Comments: Running on the ground,  moaning  HENT:     Head: Normocephalic and atraumatic.  Eyes:     Conjunctiva/sclera: Conjunctivae normal.     Pupils: Pupils are equal, round, and reactive to light.  Cardiovascular:     Rate and Rhythm: Normal rate and regular rhythm.  Pulmonary:     Effort: Pulmonary effort is normal. No respiratory distress.  Abdominal:     General: There is no distension.     Tenderness: There is no abdominal tenderness.  Skin:    General: Skin is warm and dry.  Neurological:     General: No focal deficit present.     Mental Status: He is alert. Mental status is at baseline.     ED Results / Procedures / Treatments   Labs (all labs ordered are listed, but only abnormal results are displayed) Labs Reviewed  COMPREHENSIVE METABOLIC PANEL - Abnormal; Notable for the following components:      Result Value   CO2 20 (*)    Glucose, Bld 145 (*)    BUN 27 (*)    Creatinine, Ser 1.95 (*)    Calcium 11.2 (*)    Total Protein 9.2 (*)    Albumin 5.4 (*)    AST 47 (*)    GFR, Estimated 45 (*)    Anion gap  19 (*)    All other components within normal limits  CBC WITH DIFFERENTIAL/PLATELET - Abnormal; Notable for the following components:   WBC 20.0 (*)    Neutro Abs 17.4 (*)    Monocytes Absolute 1.4 (*)    Abs Immature Granulocytes 0.09 (*)    All other components within normal limits  ACETAMINOPHEN LEVEL - Abnormal; Notable for the following components:   Acetaminophen (Tylenol), Serum <10 (*)    All other components within normal limits  CK - Abnormal; Notable for the following components:   Total CK 488 (*)    All other components within normal limits  ETHANOL  MAGNESIUM  URINALYSIS, ROUTINE W REFLEX MICROSCOPIC  RAPID URINE DRUG SCREEN, HOSP PERFORMED    EKG None  Radiology No results found.  Procedures Procedures    Medications Ordered in ED Medications  sodium chloride 0.9 % bolus 1,000 mL (has no administration in time range)  lactated ringers bolus 2,000 mL (has  no administration in time range)  HYDROmorphone (DILAUDID) injection 1 mg (1 mg Intravenous Given 01/21/22 2128)  droperidol (INAPSINE) 2.5 MG/ML injection 2.5 mg (2.5 mg Intravenous Given 01/21/22 2133)  fentaNYL (SUBLIMAZE) injection 50 mcg (50 mcg Intravenous Given 01/21/22 2204)  LORazepam (ATIVAN) injection 1 mg (1 mg Intravenous Given 01/21/22 2204)    ED Course/ Medical Decision Making/ A&P Clinical Course as of 01/21/22 2319  Thu Jan 21, 2022  2154 Patient is still screaming, slapping the walls, writhing on the floor, sobbing, reporting her thick pain.  I ordered additional fentanyl as well as some Ativan. [MT]  2313 Signed out to Dr Adela Lank EDP pending CT imaging, potential admission for pain control & AKI [MT]    Clinical Course User Index [MT] Lareta Bruneau, Kermit Balo, MD                           Medical Decision Making Amount and/or Complexity of Data Reviewed Labs: ordered. Radiology: ordered.  Risk Prescription drug management.   This patient presents to the ED with concern for pain, nausea, vomiting. This involves an extensive number of treatment options, and is a complaint that carries with it a high risk of complications and morbidity.  The differential diagnosis includes cyclical vomiting versus cannabis hyperemesis syndrome versus intra-abdominal infection versus other.  Co-morbidities that complicate the patient evaluation: History of cyclical vomiting and cannabis use at high risk for recurring episodes, include gastroparesis  Additional history obtained from EMS on arrival  Patient was given Versed by EMS.  External records from outside source obtained and reviewed including hospital course and discharge summary from August of last year as noted above  I ordered and personally interpreted labs.  The pertinent results include: Likely prerenal AKI with elevation of creatinine near doubling of her baseline.  CK in the 400s.  White blood cell count also elevated.  This may  be reactive or related to another ongoing intra-abdominal issue.  I ordered imaging studies including CT renal stone study, CT lumbar spine -as patient reports history of kidney stones and also reports falling and injuring his back. His images are pending at the time of signout  I ordered medication including for pain, nausea, anxiety   Test Considered: Lower suspicion for testicular torsion at this time, I do not feel he needed scrotal ultrasound          Final Clinical Impression(s) / ED Diagnoses Final diagnoses:  None    Rx / DC  Orders ED Discharge Orders     None         Terald Sleeper, MD 01/21/22 [phone]

## 2022-01-21 NOTE — ED Notes (Signed)
Patient continues to get out of bed, lay in the floor, bang on walls. Medications have been administered and patient has been asked to remain in bed for his safety.

## 2022-01-21 NOTE — ED Provider Notes (Signed)
I see the patient in signout from Dr. Renaye Rakers, briefly the patient is a 36 year old male with a chief complaints of abdominal pain nausea and vomiting.  Felt like a kidney stone to the patient.  Plan for CT imaging and reassess.  Patient did have a significant AKI.  Last time he presented for similar illness had rhabdomyolysis but today CK is low.  We will give IV fluids.  Patient had intractable nausea we will give another dose of antiemetic.   Patient unfortunately has required multiple rounds of sedatives.  Unable to get a CT scan.  Eventually decision was made to consciously sedate the patient for CT.  Post ketamine sedation patient still quite agitated.  I discussed case with critical care will start on Precedex.  Critical care to admit.  .Sedation  Date/Time: 01/22/2022 5:59 AM  Performed by: Melene Plan, DO Authorized by: Melene Plan, DO   Consent:    Consent obtained:  Verbal   Consent given by:  Patient   Risks discussed:  Allergic reaction, prolonged hypoxia resulting in organ damage, prolonged sedation necessitating reversal and inadequate sedation   Alternatives discussed:  Analgesia without sedation Universal protocol:    Immediately prior to procedure, a time out was called: yes     Patient identity confirmed:  Anonymous protocol, patient vented/unresponsive Indications:    Procedure performed:  Imaging studies Pre-sedation assessment:    Time since last food or drink:  6   ASA classification: class 2 - patient with mild systemic disease     Mouth opening:  2 finger widths   Thyromental distance:  3 finger widths   Mallampati score:  III - soft palate, base of uvula visible   Neck mobility: normal     Pre-sedation assessments completed and reviewed: airway patency, cardiovascular function, hydration status, mental status, nausea/vomiting, pain level, respiratory function and temperature   Immediate pre-procedure details:    Reassessment: Patient reassessed immediately  prior to procedure     Reviewed: vital signs     Verified: bag valve mask available, emergency equipment available, intubation equipment available and IV patency confirmed   Procedure details (see MAR for exact dosages):    Preoxygenation:  Nasal cannula   Sedation:  Ketamine   Intra-procedure monitoring:  Blood pressure monitoring, cardiac monitor, continuous capnometry, continuous pulse oximetry, frequent LOC assessments and frequent vital sign checks   Intra-procedure events: none     Total Provider sedation time (minutes):  35 Post-procedure details:    Attendance: Constant attendance by certified staff until patient recovered     Recovery: Patient returned to pre-procedure baseline     Post-sedation assessments completed and reviewed: airway patency, cardiovascular function, hydration status, mental status, nausea/vomiting, pain level, respiratory function and temperature     Patient is stable for discharge or admission: yes     Procedure completion:  Tolerated well, no immediate complications    CRITICAL CARE Performed by: Rae Roam   Total critical care time: 80 minutes  Critical care time was exclusive of separately billable procedures and treating other patients.  Critical care was necessary to treat or prevent imminent or life-threatening deterioration.  Critical care was time spent personally by me on the following activities: development of treatment plan with patient and/or surrogate as well as nursing, discussions with consultants, evaluation of patient's response to treatment, examination of patient, obtaining history from patient or surrogate, ordering and performing treatments and interventions, ordering and review of laboratory studies, ordering and review of radiographic studies, pulse  oximetry and re-evaluation of patient's condition.       Melene Plan, DO 01/22/22 0600

## 2022-01-21 NOTE — ED Triage Notes (Signed)
Pt BIB [facility] from home c/o abdominal pain, emesis, and psychosis. Per EMS, pt has been off ativan and seroquel x3 weeks. Pt AO4, VSS

## 2022-01-22 ENCOUNTER — Emergency Department (HOSPITAL_COMMUNITY): Payer: Self-pay

## 2022-01-22 ENCOUNTER — Inpatient Hospital Stay (HOSPITAL_COMMUNITY): Payer: Self-pay

## 2022-01-22 ENCOUNTER — Encounter (HOSPITAL_COMMUNITY): Payer: Self-pay

## 2022-01-22 ENCOUNTER — Other Ambulatory Visit: Payer: Self-pay

## 2022-01-22 DIAGNOSIS — R4182 Altered mental status, unspecified: Secondary | ICD-10-CM | POA: Diagnosis present

## 2022-01-22 DIAGNOSIS — R410 Disorientation, unspecified: Secondary | ICD-10-CM

## 2022-01-22 DIAGNOSIS — N179 Acute kidney failure, unspecified: Secondary | ICD-10-CM

## 2022-01-22 LAB — HEMOGLOBIN A1C
Hgb A1c MFr Bld: 5 % (ref 4.8–5.6)
Mean Plasma Glucose: 96.8 mg/dL

## 2022-01-22 LAB — CBC
HCT: 33.2 % — ABNORMAL LOW (ref 39.0–52.0)
HCT: 35.1 % — ABNORMAL LOW (ref 39.0–52.0)
Hemoglobin: 12.4 g/dL — ABNORMAL LOW (ref 13.0–17.0)
Hemoglobin: 13 g/dL (ref 13.0–17.0)
MCH: 34.6 pg — ABNORMAL HIGH (ref 26.0–34.0)
MCH: 34.2 pg — ABNORMAL HIGH (ref 26.0–34.0)
MCHC: 37.3 g/dL — ABNORMAL HIGH (ref 30.0–36.0)
MCHC: 37 g/dL — ABNORMAL HIGH (ref 30.0–36.0)
MCV: 92.7 fL (ref 80.0–100.0)
MCV: 92.4 fL (ref 80.0–100.0)
Platelets: 163 10*3/uL (ref 150–400)
Platelets: 145 10*3/uL — ABNORMAL LOW (ref 150–400)
RBC: 3.58 MIL/uL — ABNORMAL LOW (ref 4.22–5.81)
RBC: 3.8 MIL/uL — ABNORMAL LOW (ref 4.22–5.81)
RDW: 11.4 % — ABNORMAL LOW (ref 11.5–15.5)
RDW: 11.6 % (ref 11.5–15.5)
WBC: 17.2 10*3/uL — ABNORMAL HIGH (ref 4.0–10.5)
WBC: 18.3 10*3/uL — ABNORMAL HIGH (ref 4.0–10.5)
nRBC: 0 % (ref 0.0–0.2)
nRBC: 0 % (ref 0.0–0.2)

## 2022-01-22 LAB — URINALYSIS, ROUTINE W REFLEX MICROSCOPIC
Bilirubin Urine: NEGATIVE
Glucose, UA: NEGATIVE mg/dL
Ketones, ur: 5 mg/dL — AB
Leukocytes,Ua: NEGATIVE
Nitrite: NEGATIVE
Protein, ur: 30 mg/dL — AB
Specific Gravity, Urine: 1.02 (ref 1.005–1.030)
pH: 6 (ref 5.0–8.0)

## 2022-01-22 LAB — BASIC METABOLIC PANEL
Anion gap: 7 (ref 5–15)
BUN: 26 mg/dL — ABNORMAL HIGH (ref 6–20)
CO2: 22 mmol/L (ref 22–32)
Calcium: 9.4 mg/dL (ref 8.9–10.3)
Chloride: 102 mmol/L (ref 98–111)
Creatinine, Ser: 1.04 mg/dL (ref 0.61–1.24)
GFR, Estimated: >60 mL/min (ref >60)
Glucose, Bld: 118 mg/dL — ABNORMAL HIGH (ref 70–99)
Potassium: 4.7 mmol/L (ref 3.5–5.1)
Sodium: 131 mmol/L — ABNORMAL LOW (ref 135–145)

## 2022-01-22 LAB — AMMONIA: Ammonia: 28 umol/L (ref 9–35)

## 2022-01-22 LAB — RAPID URINE DRUG SCREEN, HOSP PERFORMED
Amphetamines: POSITIVE — AB
Barbiturates: NOT DETECTED
Benzodiazepines: POSITIVE — AB
Cocaine: POSITIVE — AB
Opiates: NOT DETECTED
Tetrahydrocannabinol: POSITIVE — AB

## 2022-01-22 LAB — GLUCOSE, CAPILLARY
Glucose-Capillary: 118 mg/dL — ABNORMAL HIGH (ref 70–99)
Glucose-Capillary: 127 mg/dL — ABNORMAL HIGH (ref 70–99)
Glucose-Capillary: 85 mg/dL (ref 70–99)
Glucose-Capillary: 85 mg/dL (ref 70–99)
Glucose-Capillary: 88 mg/dL (ref 70–99)

## 2022-01-22 LAB — MRSA NEXT GEN BY PCR, NASAL: MRSA by PCR Next Gen: NOT DETECTED

## 2022-01-22 LAB — CREATININE, SERUM
Creatinine, Ser: 1.26 mg/dL — ABNORMAL HIGH (ref 0.61–1.24)
GFR, Estimated: >60 mL/min (ref >60)

## 2022-01-22 LAB — CK: Total CK: 2727 U/L — ABNORMAL HIGH (ref 49–397)

## 2022-01-22 LAB — HIV ANTIBODY (ROUTINE TESTING W REFLEX): HIV Screen 4th Generation wRfx: NONREACTIVE

## 2022-01-22 LAB — LIPASE, BLOOD: Lipase: 21 U/L (ref 11–51)

## 2022-01-22 MED ORDER — PROSOURCE TF20 ENFIT COMPATIBL EN LIQD
60.0000 mL | Freq: Every day | ENTERAL | Status: DC
  Administered 2022-01-23: 60 mL
  Filled 2022-01-22: qty 60

## 2022-01-22 MED ORDER — SODIUM CHLORIDE 0.9 % IV SOLN: INTRAVENOUS | Status: DC | PRN

## 2022-01-22 MED ORDER — HYDRALAZINE HCL 20 MG/ML IJ SOLN
5.0000 mg | INTRAMUSCULAR | Status: DC | PRN
  Administered 2022-01-22 (×2): 5 mg via INTRAVENOUS
  Filled 2022-01-22 (×2): qty 1

## 2022-01-22 MED ORDER — DEXMEDETOMIDINE HCL IN NACL 400 MCG/100ML IV SOLN
0.4000 ug/kg/h | INTRAVENOUS | Status: DC
  Administered 2022-01-22: 0.6 ug/kg/h via INTRAVENOUS
  Administered 2022-01-22: 0.4 ug/kg/h via INTRAVENOUS
  Administered 2022-01-22: 0.7 ug/kg/h via INTRAVENOUS
  Administered 2022-01-23: 1.3 ug/kg/h via INTRAVENOUS
  Administered 2022-01-23: 0.4 ug/kg/h via INTRAVENOUS
  Administered 2022-01-23: 0.7 ug/kg/h via INTRAVENOUS
  Filled 2022-01-22 (×6): qty 100

## 2022-01-22 MED ORDER — SODIUM CHLORIDE 0.9 % IV SOLN
12.5000 mg | Freq: Four times a day (QID) | INTRAVENOUS | Status: DC | PRN
  Administered 2022-01-22 – 2022-01-26 (×7): 12.5 mg via INTRAVENOUS
  Filled 2022-01-22 (×8): qty 0.5

## 2022-01-22 MED ORDER — ZIPRASIDONE MESYLATE 20 MG IM SOLR
20.0000 mg | Freq: Once | INTRAMUSCULAR | Status: AC
  Administered 2022-01-22: 20 mg via INTRAMUSCULAR
  Filled 2022-01-22: qty 20

## 2022-01-22 MED ORDER — LACTATED RINGERS IV BOLUS
1000.0000 mL | Freq: Once | INTRAVENOUS | Status: AC
  Administered 2022-01-22: 1000 mL via INTRAVENOUS

## 2022-01-22 MED ORDER — MIDAZOLAM HCL 2 MG/2ML IJ SOLN
2.0000 mg | Freq: Once | INTRAMUSCULAR | Status: AC
  Administered 2022-01-22: 2 mg via INTRAVENOUS
  Filled 2022-01-22: qty 2

## 2022-01-22 MED ORDER — DOCUSATE SODIUM 100 MG PO CAPS
100.0000 mg | ORAL_CAPSULE | Freq: Two times a day (BID) | ORAL | Status: DC | PRN
  Administered 2022-01-29: 100 mg via ORAL
  Filled 2022-01-22: qty 1

## 2022-01-22 MED ORDER — CHLORHEXIDINE GLUCONATE CLOTH 2 % EX PADS
6.0000 | MEDICATED_PAD | Freq: Every day | CUTANEOUS | Status: DC
  Administered 2022-01-22 – 2022-01-29 (×6): 6 via TOPICAL

## 2022-01-22 MED ORDER — LORAZEPAM 2 MG/ML IJ SOLN
4.0000 mg | Freq: Once | INTRAMUSCULAR | Status: AC
  Administered 2022-01-22: 4 mg via INTRAVENOUS
  Filled 2022-01-22: qty 2

## 2022-01-22 MED ORDER — DIPHENHYDRAMINE HCL 50 MG/ML IJ SOLN
25.0000 mg | Freq: Once | INTRAMUSCULAR | Status: AC
  Administered 2022-01-22: 25 mg via INTRAVENOUS
  Filled 2022-01-22: qty 1

## 2022-01-22 MED ORDER — LORAZEPAM 2 MG/ML IJ SOLN
2.0000 mg | INTRAMUSCULAR | Status: DC | PRN
  Administered 2022-01-22 (×3): 2 mg via INTRAVENOUS
  Filled 2022-01-22 (×3): qty 1

## 2022-01-22 MED ORDER — OXYCODONE HCL 5 MG PO TABS
5.0000 mg | ORAL_TABLET | ORAL | Status: AC | PRN
  Administered 2022-01-22 – 2022-01-24 (×6): 5 mg via ORAL
  Filled 2022-01-22 (×6): qty 1

## 2022-01-22 MED ORDER — STERILE WATER FOR INJECTION IJ SOLN
INTRAMUSCULAR | Status: AC
  Administered 2022-01-22: 1.2 mL
  Filled 2022-01-22: qty 10

## 2022-01-22 MED ORDER — ONDANSETRON HCL 4 MG/2ML IJ SOLN
4.0000 mg | Freq: Four times a day (QID) | INTRAMUSCULAR | Status: DC | PRN
  Administered 2022-01-22 – 2022-01-25 (×8): 4 mg via INTRAVENOUS
  Filled 2022-01-22 (×8): qty 2

## 2022-01-22 MED ORDER — MIDAZOLAM HCL 2 MG/2ML IJ SOLN
5.0000 mg | Freq: Once | INTRAMUSCULAR | Status: AC
  Administered 2022-01-22: 5 mg via INTRAVENOUS
  Filled 2022-01-22: qty 6

## 2022-01-22 MED ORDER — CAPSAICIN 0.025 % EX CREA
TOPICAL_CREAM | Freq: Two times a day (BID) | CUTANEOUS | Status: DC
  Administered 2022-01-23: 1 via TOPICAL
  Filled 2022-01-22 (×2): qty 60

## 2022-01-22 MED ORDER — HEPARIN SODIUM (PORCINE) 5000 UNIT/ML IJ SOLN
5000.0000 [IU] | Freq: Three times a day (TID) | INTRAMUSCULAR | Status: DC
  Administered 2022-01-22 – 2022-01-23 (×3): 5000 [IU] via SUBCUTANEOUS
  Filled 2022-01-22 (×3): qty 1

## 2022-01-22 MED ORDER — POLYETHYLENE GLYCOL 3350 17 G PO PACK
17.0000 g | PACK | Freq: Every day | ORAL | Status: DC | PRN
  Administered 2022-01-23: 17 g via ORAL
  Filled 2022-01-22: qty 1

## 2022-01-22 MED ORDER — LACTATED RINGERS IV SOLN
INTRAVENOUS | Status: DC
  Administered 2022-01-23: 100 mL/h via INTRAVENOUS

## 2022-01-22 MED ORDER — KETAMINE HCL 50 MG/5ML IJ SOSY
PREFILLED_SYRINGE | INTRAMUSCULAR | Status: AC
  Administered 2022-01-22: 100 mg via INTRAVENOUS
  Filled 2022-01-22: qty 10

## 2022-01-22 MED ORDER — ORAL CARE MOUTH RINSE: 15.0000 mL | OROMUCOSAL | Status: DC | PRN

## 2022-01-22 MED ORDER — AMLODIPINE BESYLATE 10 MG PO TABS
10.0000 mg | ORAL_TABLET | Freq: Every day | ORAL | Status: DC
  Administered 2022-01-22: 10 mg
  Filled 2022-01-22: qty 1

## 2022-01-22 MED ORDER — INSULIN ASPART 100 UNIT/ML IJ SOLN
0.0000 [IU] | INTRAMUSCULAR | Status: DC
  Administered 2022-01-22: 1 [IU] via SUBCUTANEOUS

## 2022-01-22 MED ORDER — HALOPERIDOL LACTATE 5 MG/ML IJ SOLN
5.0000 mg | Freq: Once | INTRAMUSCULAR | Status: AC
  Administered 2022-01-22: 5 mg via INTRAVENOUS
  Filled 2022-01-22: qty 1

## 2022-01-22 MED ORDER — ACETAMINOPHEN 160 MG/5ML PO SOLN
650.0000 mg | ORAL | Status: AC | PRN
  Administered 2022-01-22 – 2022-01-23 (×2): 650 mg
  Filled 2022-01-22 (×2): qty 20.3

## 2022-01-22 MED ORDER — OSMOLITE 1.5 CAL PO LIQD
1000.0000 mL | ORAL | Status: DC
  Administered 2022-01-23: 1000 mL

## 2022-01-22 MED ORDER — KETAMINE HCL 50 MG/5ML IJ SOSY
100.0000 mg | PREFILLED_SYRINGE | Freq: Once | INTRAMUSCULAR | Status: AC
  Filled 2022-01-22: qty 10

## 2022-01-22 MED ORDER — KETAMINE HCL 50 MG/5ML IJ SOSY
15.0000 mg | PREFILLED_SYRINGE | Freq: Once | INTRAMUSCULAR | Status: AC
  Administered 2022-01-22: 15 mg via INTRAVENOUS
  Filled 2022-01-22: qty 5

## 2022-01-22 NOTE — H&P (Signed)
NAME:  Erik Cowan, MRN:  [id number], DOB:  08-25-1985, LOS: 0 ADMISSION DATE:  01/21/2022, CONSULTATION DATE:  01/22/22 REFERRING MD:  EDP, CHIEF COMPLAINT:  abdominal pain, confusion   History of Present Illness:  Erik Cowan is a 36 y/o M with PMH significant for cyclical vomiting syndrome, cannabis use, bipolar type 1, GERD, schizophrenia who was brought in by EMS for abdominal pain, vomiting and confusion.  History taken from Epic as patient is unable to give history and there is no family present.  He has reportedly been off ativan and seroquel for several days and thought he was passing kidney stones. Said he had fallen in the bathtub and his has back.   He was given IVF and droperidol, dilaudid and versed.  He became more agitated and required ketamine to obtain CT abd/pelvis.  Labs significant for creatinine of 1.9 from normal baseline, ck 488, WBC 20k.  Given his continued encephalopathy Precedex was ordered and PCCM consulted for admission  Pertinent  Medical History   has a past medical history of Bipolar 1 disorder ([place]), Bipolar disorder ([place]), Cyclic vomiting syndrome, GERD (gastroesophageal reflux disease), Nephrolithiasis, and Schizophrenia, acute ([place]).   Significant Hospital Events: Including procedures, antibiotic start and stop dates in addition to other pertinent events   8/25 admit to PCCM for precedex  Interim History / Subjective:  More agitated, requiring restraints  Objective   Blood pressure (!) 137/120, pulse 79, resp. rate (!) 47, height 6\' 2"  (1.88 m), weight 73.9 kg, SpO2 97 %.        Intake/Output Summary (Last 24 hours) at 01/22/2022 0517 Last data filed at 01/22/2022 [id number] Gross per 24 hour  Intake 3000 ml  Output --  Net 3000 ml   Filed Weights   01/22/22 0357  Weight: 73.9 kg    General:  young, agitated, writhing HEENT: MM pink/moist Neuro: awake, sitting up in bed pulling at restraints, moaning, not responding CV: s1s2 rrr, no  m/r/g PULM:  on room air with equal chest rise and no accessory muscle use GI: soft, non-distended Extremities: warm/dry, no edema  Skin: no rashes or lesions  Resolved Hospital Problem list     Assessment & Plan:    Acute Encephalopathy History of Bipolar disorder and schizophrenia Agitation and delirium worsened throughout ED course despite pain control and multiple sedating medications -start precedex, admit to ICU -UDS/UA -may need further Haldol/Versed -consider head CT when calmer  Abdominal pain History of cyclical vomiting  -CT abdomen pelvis pending -received Dilaudid and Droperidol -continue prn Zofran   AKI Suspect likely pre-renal volume depletion in the setting of vomiting CK 400 -given 3 L IVF -continue maintenance LR -monitor renal indices and electrolytes and avoid nephrotoxins  Best Practice (right click and "Reselect all SmartList Selections" daily)   Diet/type: NPO DVT prophylaxis: prophylactic heparin  GI prophylaxis: PPI Lines: N/A Foley:  N/A Code Status:  full code Last date of multidisciplinary goals of care discussion [pending]  Labs   CBC: Recent Labs  Lab 01/21/22 2125  WBC 20.0*  NEUTROABS 17.4*  HGB 14.8  HCT 41.6  MCV 95.0  PLT [id number]    Basic Metabolic Panel: Recent Labs  Lab 01/21/22 2125  NA 137  K 3.5  CL 98  CO2 20*  GLUCOSE 145*  BUN 27*  CREATININE 1.95*  CALCIUM 11.2*  MG 1.8   GFR: Estimated Creatinine Clearance: 54.7 mL/min (A) (by C-G formula based on SCr of 1.95 mg/dL (H)). Recent Labs  Lab  01/21/22 2125  WBC 20.0*    Liver Function Tests: Recent Labs  Lab 01/21/22 2125  AST 47*  ALT 29  ALKPHOS 82  BILITOT 1.0  PROT 9.2*  ALBUMIN 5.4*   Recent Labs  Lab 01/21/22 2125  LIPASE 21   No results for input(s): "AMMONIA" in the last 168 hours.  ABG    Component Value Date/Time   TCO2 25 01/21/2021 2041     Coagulation Profile: No results for input(s): "INR", "PROTIME" in the last  168 hours.  Cardiac Enzymes: Recent Labs  Lab 01/21/22 2125  CKTOTAL 488*    HbA1C: Hgb A1c MFr Bld  Date/Time Value Ref Range Status  08/20/2010 12:03 PM 5.0 4.6 - 6.5 % Final    Comment:    Glycemic Control Guidelines for People with Diabetes:Non Diabetic:  <6%Goal of Therapy: <7%Additional Action Suggested:  >8%     CBG: No results for input(s): "GLUCAP" in the last 168 hours.  Review of Systems:   Unable to obtain secondary to encephalopathy  Past Medical History:  He,  has a past medical history of Bipolar 1 disorder ([facility]), Bipolar disorder ([facility]), Cyclic vomiting syndrome, GERD (gastroesophageal reflux disease), Nephrolithiasis, and Schizophrenia, acute ([facility]).   Surgical History:   Past Surgical History:  Procedure Laterality Date   APPENDECTOMY     unremarkable       Social History:   reports that he has been smoking cigarettes. He has never used smokeless tobacco. He reports current alcohol use. He reports current drug use. Frequency: 7.00 times per week. Drug: Marijuana.   Family History:  His family history includes Colon cancer in his maternal uncle; Diabetes in his maternal aunt; Heart disease in his paternal grandfather; Liver cancer in his mother; Testicular cancer in his brother.   Allergies Allergies  Allergen Reactions   Gluten Meal Nausea And Vomiting    Stomach pains   Milk-Related Compounds Nausea And Vomiting    Stomach upset   Latex Rash     Home Medications  Prior to Admission medications   Medication Sig Start Date End Date Taking? Authorizing Provider  Multiple Vitamin (MULTIVITAMIN WITH MINERALS) TABS tablet Take 1 tablet by mouth daily. 01/27/21   Sheikh, Omair Latif, DO  nicotine (NICODERM CQ - DOSED IN MG/24 HOURS) 14 mg/24hr patch Place 1 patch (14 mg total) onto the skin daily. 01/27/21   Marguerita Merles Latif, DO  ondansetron (ZOFRAN ODT) 4 MG disintegrating tablet Take 1 tablet (4 mg total) by mouth every 8 (eight) hours as needed for  nausea or vomiting. 01/26/21   Marguerita Merles Latif, DO  pantoprazole (PROTONIX) 40 MG tablet Take 1 tablet (40 mg total) by mouth daily. 01/26/21   Merlene Laughter, DO     Critical care time: 35 minutes     CRITICAL CARE Performed by: Darcella Gasman Giovani Neumeister   Total critical care time: 35 minutes  Critical care time was exclusive of separately billable procedures and treating other patients.  Critical care was necessary to treat or prevent imminent or life-threatening deterioration.  Critical care was time spent personally by me on the following activities: development of treatment plan with patient and/or surrogate as well as nursing, discussions with consultants, evaluation of patient's response to treatment, examination of patient, obtaining history from patient or surrogate, ordering and performing treatments and interventions, ordering and review of laboratory studies, ordering and review of radiographic studies, pulse oximetry and re-evaluation of patient's condition.   Darcella Gasman Tanveer Dobberstein, PA-C  Pulmonary &  Critical care See Amion for pager If no response to pager , please call [phone] until 7pm After 7:00 pm call Elink  [phone]

## 2022-01-22 NOTE — Progress Notes (Signed)
Initial Nutrition Assessment  DOCUMENTATION CODES:   Non-severe (moderate) malnutrition in context of chronic illness  INTERVENTION:   Initiate tube feeding via Cortrak Saturday morning: Osmolite 1.5 at 25 ml/h, increase by 10 ml every 8 hours to goal rate of 65 ml/h (1560 ml per day). Prosource TF20 60 ml once daily.  Provides 2420 kcal, 118 gm protein, 1189 ml free water daily.  Monitor magnesium, potassium, and phosphorus BID for at least 3 days, MD to replete as needed, as pt is at risk for refeeding syndrome given moderate malnutrition.  NUTRITION DIAGNOSIS:   Moderate Malnutrition related to chronic illness (cyclical vomiting syndrome) as evidenced by mild fat depletion, mild muscle depletion.  GOAL:   Patient will meet greater than or equal to 90% of their needs  MONITOR:   TF tolerance, Labs, Diet advancement  REASON FOR ASSESSMENT:   Consult Enteral/tube feeding initiation and management (to begin tomorrow morning)  ASSESSMENT:   36 yo male admitted with abdominal pain, vomiting, confusion. PMH includes bipolar d/o, schizophrenia, cyclical vomiting syndrome, cannabis use, GERD.  Cortrak placed this morning with tip in the distal stomach. Plans to begin enteral nutrition tomorrow morning.   Patient reports approximately 10 lb weight loss recently. He was unable to provide any other nutrition hx. No recent weight hx available in chart for review. Most recent weight PTA was 70.3 kg on 01/21/21. Current weight 73.9 kg.  Labs reviewed. Na 131, phos 2 CBG: [phone] Medications reviewed and include Novolog SSI.   NUTRITION - FOCUSED PHYSICAL EXAM:  Flowsheet Row Most Recent Value  Orbital Region No depletion  Upper Arm Region No depletion  Thoracic and Lumbar Region Mild depletion  Buccal Region Mild depletion  Temple Region Mild depletion  Clavicle Bone Region Moderate depletion  Clavicle and Acromion Bone Region Mild depletion  Scapular Bone Region Mild  depletion  Dorsal Hand No depletion  Patellar Region Moderate depletion  Anterior Thigh Region Moderate depletion  Posterior Calf Region Moderate depletion  Edema (RD Assessment) None  Hair Reviewed  Eyes Reviewed  Mouth Reviewed  Skin Reviewed  Nails Reviewed       Diet Order:   Diet Order     None       EDUCATION NEEDS:   Not appropriate for education at this time  Skin:  Skin Assessment: Reviewed RN Assessment  Last BM:  no BM documented  Height:   Ht Readings from Last 1 Encounters:  01/22/22 6\' 2"  (1.88 m)    Weight:   Wt Readings from Last 1 Encounters:  01/22/22 73.9 kg    Ideal Body Weight:  86.4 kg  BMI:  Body mass index is 20.93 kg/m.  Estimated Nutritional Needs:   Kcal:  2400-2600  Protein:  110-130 gm  Fluid:  2.4-2.6 L   01/24/22 RD, LDN, CNSC Please refer to Amion for contact information.

## 2022-01-22 NOTE — ED Notes (Signed)
Resp at bedside. Pt is going to ct 4. Will be under conscious sedation at the time.

## 2022-01-22 NOTE — Progress Notes (Signed)
eLink Physician-Brief Progress Note Patient Name: Erik Cowan DOB: [birth year] MRN: [id number]   Date of Service  01/22/2022  HPI/Events of Note  Patient with agitated delirium, Precedex gtt was not hooked up to patient.  eICU Interventions  Precedex gtt hooked up and infusing.        Migdalia Dk 01/22/2022, 8:54 PM

## 2022-01-22 NOTE — Progress Notes (Signed)
eLink Physician-Brief Progress Note Patient Name: Erik Cowan DOB: Jun 28, 1985 MRN: [id number]   Date of Service  01/22/2022  HPI/Events of Note  Patient with abdominal discomfort related to constipation, CT abdomen was negative for acute abdomen, he is on Miralax.  eICU Interventions  PRN Tylenol ordered for abdominal discomfort.        Thomasene Lot [name] 01/22/2022, 8:01 PM

## 2022-01-22 NOTE — ED Notes (Signed)
Pt returned from ambulating to restroom. Transport here to take pt to ct

## 2022-01-22 NOTE — ED Notes (Signed)
Pt continues to try and get out of restraints. He is not answering questions appropriately at this time. Difficulty getting VS due to pt not holding still for them

## 2022-01-22 NOTE — ED Notes (Signed)
Family at bedside. Pt is now in non-violent restraints but continues to attempt to get out of bed with restraints on.

## 2022-01-22 NOTE — ED Notes (Signed)
Pain meds has been admin. Pt is tossing and turning in bed, slinging his legs to both sides.

## 2022-01-22 NOTE — Progress Notes (Signed)
eLink Physician-Brief Progress Note Patient Name: Erik Cowan DOB: 1985-06-04 MRN: [id number]   Date of Service  01/22/2022  HPI/Events of Note  Patient still complaining of pain after receiving Tylenol, he is agitated and at risk of falling out of bed.  eICU Interventions  PRN Oxycodone added for pain not relieved by Tylenol, Bilateral soft wrist restraints ordered.        Erik Cowan 01/22/2022, 10:05 PM

## 2022-01-22 NOTE — ED Notes (Signed)
Pt continues to fight against the restraints and pulling his arms loose from the restraints. Pulled PIV out in LT FA.

## 2022-01-22 NOTE — Consult Note (Signed)
  Erik Cowan is a 36 y/o M with PMH significant for cyclical vomiting syndrome, cannabis use, bipolar type 1, GERD, schizophrenia who was brought in by EMS for abdominal pain, vomiting and confusion.  Psych consult placed for help with psychosis.  Patient has presented with agitated delirium throughout course of admission despite use of multiple sedating medications to include Haldol, Versed, droperidol, ketamine, Benadryl, fentanyl, Dilaudid, Ativan, and Geodon.  Prior to admission to intensive care, patient did endorse using suspected PCP. There is a another hospitalization last year, in which patient presented in similar manner after eating suspected lace cookies.    Patient's degree of agitation and delirium does appear to be consistent with PCP use.  There is little evidence that has been shown effective in management of PCP intoxication.  Benzodiazepines are prefer agents to achieve chemical sedation in patients with increased agitation and delirium.   Haldol is preferred adjuvant therapy with benzodiazepines, to control symptoms of agitation, aggression, and psychosis.  Haldol and benzodiazepines can be administered more frequently over olanzapine.  -Recommend Haldol 5 mg IV every 8 hours.  EKG obtained; QTc 418. -Patient will benefit from high dose amounts of benzodiazepines such as (4 mg Ativan IV push or 5 mg Versed), may benefit from increased amounts and frequency.   Patient is seen and attempted to assess, however is currently sedated. Psychiatric evaluation should be obtained, once medical problems have been addressed.  Patient may be open to inpatient rehabilitation.  Consider TOC consult.

## 2022-01-22 NOTE — Progress Notes (Signed)
Pt continuously attempting to get out of bed, pulling at Cortrak per Administrator, sports. This RN notified by Administrator, sports that if patient attempts these things once more and is not redirectable, pt will be taken off of camera sitter because he will be deemed inappropriate for it. Patient is currently not redirectable. Consulting civil engineer notified. Charge RN requested in-person Recruitment consultant from house supervisor and was told that the hospital does not have any staff to provide. ELINK notified, bilateral wrist restraints reordered. This RN continuing to titrate up on Precedex gtt and giving Ativan PRN as available. Pt currently resting on his side with eyes closed; will apply wrist restraints if patient continues to pull at lines and get up.

## 2022-01-22 NOTE — ED Notes (Signed)
Called to request safety sitter, advised none available until after 7am. Safety sitter order placed at this time. At this time there is no one but this RN at bedside. Pt is continuing to sit up in bed and trying to pull out of the restraints. Eyes are opened but he is not speaking verbally at this moment.

## 2022-01-22 NOTE — ED Notes (Signed)
Per Dr. Delton Coombes increase precedex to 1.2 at this time

## 2022-01-22 NOTE — Procedures (Signed)
Cortrak  Person Inserting Tube:  Osa Craver, RD Tube Type:  Cortrak - 43 inches Tube Size:  10 Tube Location:  Left nare Technique Used to Measure Tube Placement:  Marking at nare/corner of mouth Cortrak Secured At:  74 cm Procedure Comments:  Cortrak Tube Team Note:  Consult received to place a Cortrak feeding tube.   X-ray is required, abdominal x-ray has been ordered by the Cortrak team. Please confirm tube placement before using the Cortrak tube.   If the tube becomes dislodged please keep the tube and contact the Cortrak team at [email or web address] (password [email or web address]) for replacement.  If after hours and replacement cannot be delayed, place a NG tube and confirm placement with an abdominal x-ray.    Romelle Starcher MS, RDN, LDN, CNSC Registered Dietitian 3 Clinical Nutrition RD Pager and On-Call Pager Number Located in [place]

## 2022-01-22 NOTE — Sedation Documentation (Signed)
Difficulty getting VS prior to and after procedure due to pt not lying still and trying to get out of restraints

## 2022-01-22 NOTE — Procedures (Signed)
Patient Name: Erik Cowan  MRN: [id number]  Epilepsy Attending: Charlsie Quest  Referring Physician/Provider: Lorin Glass, MD  Date: 01/22/2022  Duration: 22.44 mins  Patient history: 36yo M with ams. EEG to evaluate for seizure.  Level of alertness: awake, asleep  AEDs during EEG study: Ativan  Technical aspects: This EEG study was done with scalp electrodes positioned according to the 10-20 International system of electrode placement. Electrical activity was reviewed with band pass filter of 1-70Hz , sensitivity of 7 uV/mm, display speed of 50mm/sec with a 60Hz  notched filter applied as appropriate. EEG data were recorded continuously and digitally stored.  Video monitoring was available and reviewed as appropriate.  Description: During awake state, eeg showed continuous generalized 3-4hz  theta-delta slowing admixed with 15-18hz  generalized beta activity. Sleep was characterized by vertex waves, sleep spindles (12 to 14 Hz), maximal frontocentral region. Hyperventilation and photic stimulation were not performed.     ABNORMALITY - Continuous slow, generalized - Excessive beta, generalized  IMPRESSION: This study is suggestive of moderate diffuse encephalopathy, nonspecific etiology. The excessive beta activity seen in the background is most likely due to the effect of benzodiazepine and is a benign EEG pattern. No seizures or epileptiform discharges were seen throughout the recording.  Erik Cowan 

## 2022-01-22 NOTE — Progress Notes (Signed)
eLink Physician-Brief Progress Note Patient Name: Nethan Caudillo DOB: 06-15-1985 MRN: [id number]   Date of Service  01/22/2022  HPI/Events of Note  Patient still trying to climb out of bed despite bilateral soft wrist restraints.  eICU Interventions  Posey restraints added.        Migdalia Dk 01/22/2022, 10:33 PM

## 2022-01-22 NOTE — ED Notes (Signed)
Pt moved to room 22 at this time. Dr. Adela Lank is at bedside with pt and family at this time. Pt continues to fight against the restraints and trying to get out of bed. Hallucinating trying to smoke something that is not in his hand. Pt is still confused

## 2022-01-22 NOTE — ED Notes (Signed)
Pt continues to tossand turn, swing his legs around, and trying to get out of bed. Meds admin per provider order at this time.

## 2022-01-22 NOTE — Progress Notes (Signed)
EEG complete - results pending 

## 2022-01-22 NOTE — ED Notes (Signed)
Received verbal report from [name] at this time

## 2022-01-22 NOTE — Progress Notes (Signed)
Pt placed under conscious sedation for transport from ED22 to CT and back. Pt tol well.

## 2022-01-22 NOTE — Progress Notes (Addendum)
Briefly seen. Remains agitated c/w PCP intoxication Urine is not impressive for infection. UDS cannibis CT a/p ok  Will get cortrak, ask psych to see  Myrla Halsted MD PCCM

## 2022-01-23 DIAGNOSIS — R4182 Altered mental status, unspecified: Secondary | ICD-10-CM

## 2022-01-23 DIAGNOSIS — E440 Moderate protein-calorie malnutrition: Secondary | ICD-10-CM | POA: Insufficient documentation

## 2022-01-23 LAB — CBC
HCT: 31.4 % — ABNORMAL LOW (ref 39.0–52.0)
Hemoglobin: 11.7 g/dL — ABNORMAL LOW (ref 13.0–17.0)
MCH: 34.4 pg — ABNORMAL HIGH (ref 26.0–34.0)
MCHC: 37.3 g/dL — ABNORMAL HIGH (ref 30.0–36.0)
MCV: 92.4 fL (ref 80.0–100.0)
Platelets: 127 10*3/uL — ABNORMAL LOW (ref 150–400)
RBC: 3.4 MIL/uL — ABNORMAL LOW (ref 4.22–5.81)
RDW: 11.5 % (ref 11.5–15.5)
WBC: 14 10*3/uL — ABNORMAL HIGH (ref 4.0–10.5)
nRBC: 0 % (ref 0.0–0.2)

## 2022-01-23 LAB — MAGNESIUM: Magnesium: 1.8 mg/dL (ref 1.7–2.4)

## 2022-01-23 LAB — BASIC METABOLIC PANEL
Anion gap: 9 (ref 5–15)
BUN: 22 mg/dL — ABNORMAL HIGH (ref 6–20)
CO2: 22 mmol/L (ref 22–32)
Calcium: 9.2 mg/dL (ref 8.9–10.3)
Chloride: 104 mmol/L (ref 98–111)
Creatinine, Ser: 0.97 mg/dL (ref 0.61–1.24)
GFR, Estimated: >60 mL/min (ref >60)
Glucose, Bld: 107 mg/dL — ABNORMAL HIGH (ref 70–99)
Potassium: 3.9 mmol/L (ref 3.5–5.1)
Sodium: 135 mmol/L (ref 135–145)

## 2022-01-23 LAB — CK: Total CK: 3535 U/L — ABNORMAL HIGH (ref 49–397)

## 2022-01-23 LAB — GLUCOSE, CAPILLARY
Glucose-Capillary: 105 mg/dL — ABNORMAL HIGH (ref 70–99)
Glucose-Capillary: 105 mg/dL — ABNORMAL HIGH (ref 70–99)
Glucose-Capillary: 105 mg/dL — ABNORMAL HIGH (ref 70–99)
Glucose-Capillary: 113 mg/dL — ABNORMAL HIGH (ref 70–99)
Glucose-Capillary: 123 mg/dL — ABNORMAL HIGH (ref 70–99)
Glucose-Capillary: 92 mg/dL (ref 70–99)
Glucose-Capillary: 92 mg/dL (ref 70–99)
Glucose-Capillary: 94 mg/dL (ref 70–99)

## 2022-01-23 LAB — PHOSPHORUS: Phosphorus: 3.4 mg/dL (ref 2.5–4.6)

## 2022-01-23 MED ORDER — METHYLNALTREXONE BROMIDE 12 MG/0.6ML ~~LOC~~ SOLN
12.0000 mg | Freq: Once | SUBCUTANEOUS | Status: DC
Start: 2022-01-23 — End: 2022-01-23

## 2022-01-23 MED ORDER — CLONIDINE HCL 0.2 MG PO TABS: 0.2000 mg | ORAL_TABLET | Freq: Three times a day (TID) | ORAL | Status: DC

## 2022-01-23 MED ORDER — BISACODYL 10 MG RE SUPP
10.0000 mg | Freq: Every day | RECTAL | Status: DC | PRN
Start: 2022-01-23 — End: 2022-01-29

## 2022-01-23 MED ORDER — CLONAZEPAM 1 MG PO TABS
1.0000 mg | ORAL_TABLET | Freq: Two times a day (BID) | ORAL | Status: DC
Start: 2022-01-23 — End: 2022-01-24
  Administered 2022-01-23 (×2): 1 mg
  Filled 2022-01-23 (×2): qty 1

## 2022-01-23 MED ORDER — LORAZEPAM 2 MG/ML IJ SOLN
1.0000 mg | INTRAMUSCULAR | Status: AC | PRN
  Administered 2022-01-23 – 2022-01-24 (×6): 2 mg via INTRAVENOUS
  Filled 2022-01-23 (×6): qty 1

## 2022-01-23 MED ORDER — NICOTINE 14 MG/24HR TD PT24
14.0000 mg | MEDICATED_PATCH | Freq: Every day | TRANSDERMAL | Status: AC
  Administered 2022-01-23 – 2022-01-25 (×3): 14 mg via TRANSDERMAL
  Filled 2022-01-23 (×3): qty 1

## 2022-01-23 MED ORDER — SORBITOL 70 % SOLN
60.0000 mL | Freq: Every day | Status: DC
  Administered 2022-01-23: 60 mL
  Filled 2022-01-23 (×2): qty 60

## 2022-01-23 MED ORDER — MAGNESIUM SULFATE 2 GM/50ML IV SOLN
2.0000 g | Freq: Once | INTRAVENOUS | Status: AC
  Administered 2022-01-23: 2 g via INTRAVENOUS
  Filled 2022-01-23: qty 50

## 2022-01-23 MED ORDER — ENOXAPARIN SODIUM 40 MG/0.4ML IJ SOSY
40.0000 mg | PREFILLED_SYRINGE | INTRAMUSCULAR | Status: DC
  Administered 2022-01-24 – 2022-01-29 (×5): 40 mg via SUBCUTANEOUS
  Filled 2022-01-23 (×6): qty 0.4

## 2022-01-23 MED ORDER — QUETIAPINE FUMARATE 50 MG PO TABS
50.0000 mg | ORAL_TABLET | Freq: Two times a day (BID) | ORAL | Status: DC
  Administered 2022-01-23 (×2): 50 mg
  Filled 2022-01-23 (×2): qty 1

## 2022-01-23 MED ORDER — CLONIDINE HCL 0.3 MG PO TABS
0.3000 mg | ORAL_TABLET | Freq: Three times a day (TID) | ORAL | Status: DC
Start: 2022-01-23 — End: 2022-01-24
  Administered 2022-01-23 – 2022-01-24 (×4): 0.3 mg
  Filled 2022-01-23 (×4): qty 1

## 2022-01-23 NOTE — Progress Notes (Signed)
Dr. Katrinka Blazing notified for Bradycardia. Precedex infusion titrated to off. Pt is responsive to voice and alert BP stable.    01/23/22 1730  Vitals  BP (!) 148/86  MAP (mmHg) 106  BP Location Right Arm  BP Method Automatic  Patient Position (if appropriate) Lying  Pulse Rate (!) (S)  42  Pulse Rate Source Monitor  ECG Heart Rate (!) 42  Resp (!) 21  Level of Consciousness  Level of Consciousness Alert  Oxygen Therapy  SpO2 100 %  O2 Device Room Air  Patient Activity (if Appropriate) In bed  Pre-WUA / WUA Start  [place] Agitation Sedation Scale (RASS) 0  MEWS Score  MEWS Temp 0  MEWS Systolic 0  MEWS Pulse 1  MEWS RR 1  MEWS LOC 0  MEWS Score 2  MEWS Score Color Yellow

## 2022-01-23 NOTE — Progress Notes (Signed)
[facility] ADULT ICU REPLACEMENT PROTOCOL   The patient does apply for the [facility] Adult ICU Electrolyte Replacment Protocol based on the criteria listed below:   1.Exclusion criteria: TCTS patients, ECMO patients, and Dialysis patients 2. Is GFR >/= 30 ml/min? Yes.    Patient's GFR today is >60 3. Is SCr </= 2? Yes.   Patient's SCr is 0.97 mg/dL 4. Did SCr increase >/= 0.5 in 24 hours? No. 5.Pt's weight >40kg  Yes.   6. Abnormal electrolyte(s):   Mg 1.8  7. Electrolytes replaced per protocol 8.  Call MD STAT for K+ </= 2.5, Phos </= 1, or Mag </= 1 Physician:  Shawn Stall R Alyda Megna 01/23/2022 3:10 AM

## 2022-01-23 NOTE — Progress Notes (Signed)
eLink Physician-Brief Progress Note Patient Name: Erik Cowan DOB: [birth year] MRN: [id number]   Date of Service  01/23/2022  HPI/Events of Note  Patient is agitated and moving around a lot, abnormal EKG likely artifactual secondary to excessive movement.  eICU Interventions  Ativan dosing interval reduced to Q 2 hours and Precedex ceiling increased to 1.8 mcg, repeat EKG ordered for when patient has calmed down.        Thomasene Lot Erik Cowan 01/23/2022, 12:17 AM

## 2022-01-23 NOTE — Progress Notes (Signed)
   NAMEDeshan Cowan, MRN:  [id number], DOB:  [birth year], LOS: 1 ADMISSION DATE:  01/21/2022, CONSULTATION DATE:  01/23/22 REFERRING MD:  EDP, CHIEF COMPLAINT:  abdominal pain, confusion   History of Present Illness:  Erik Cowan is a 36 y/o M with PMH significant for cyclical vomiting syndrome, cannabis use, bipolar type 1, GERD, schizophrenia who was brought in by EMS for abdominal pain, vomiting and confusion.  History taken from Epic as patient is unable to give history and there is no family present.  He has reportedly been off ativan and seroquel for several days and thought he was passing kidney stones. Said he had fallen in the bathtub and his has back.   He was given IVF and droperidol, dilaudid and versed.  He became more agitated and required ketamine to obtain CT abd/pelvis.  Labs significant for creatinine of 1.9 from normal baseline, ck 488, WBC 20k.  Given his continued encephalopathy Precedex was ordered and PCCM consulted for admission  Pertinent  Medical History   has a past medical history of Bipolar 1 disorder (HCC), Bipolar disorder (HCC), Cyclic vomiting syndrome, GERD (gastroesophageal reflux disease), Nephrolithiasis, and Schizophrenia, acute (HCC).   Significant Hospital Events: Including procedures, antibiotic start and stop dates in addition to other pertinent events   8/25 admit to PCCM for precedex  Interim History / Subjective:  Remains on precedex, tough to find happy medium with agitation and somnolence.  Objective   Blood pressure 136/86, pulse (!) 52, temperature 98.6 F (37 C), temperature source Oral, resp. rate (!) 25, height 6\' 2"  (1.88 m), weight 64 kg, SpO2 100 %.        Intake/Output Summary (Last 24 hours) at 01/23/2022 01/25/2022 Last data filed at 01/23/2022 0600 Gross per 24 hour  Intake 2441.81 ml  Output 2250 ml  Net 191.81 ml    Filed Weights   01/22/22 0357 01/23/22 0500  Weight: 73.9 kg 64 kg    Laying under blankets Lungs  clear Heart sounds regular Moves all 4 ext to command RASS -1 at present Aox3  BMP ok CBC stable  Resolved Hospital Problem list     Assessment & Plan:  Acute agitated delirium/ psychosis/ encephalopathy-  in setting of (A) baseline schizophrenia/bipolar (B) stopping home meds and (C) doing PCP. 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

## 2022-01-23 NOTE — Plan of Care (Signed)
Restraints discontinued. Pt still has tele sitter, fall precautions in place, medicated as needed for pain and anxiety. Pt is on RA and drowsy (precedex infusing). He is A&Ox4 follows commands, impulsive if needs to use BSC. Several Bms in response to sorbitol and miralax given this am. Enteral feeding started via feeding tube. Spontaneous void x1. 2 PIV L arm. No skin concerns. CHG bath given. LR infusing at 160m/hr. Brother AMarjory Liesvisited today. Pt's table and cell phone with chargers in pt's room.    Problem: Safety: Goal: Non-violent Restraint(s) Outcome: Completed/Met   Problem: Safety: Goal: Non-violent Restraint(s) Outcome: Completed/Met   Problem: Education: Goal: Knowledge of General Education information will improve Description: Including pain rating scale, medication(s)/side effects and non-pharmacologic comfort measures Outcome: Progressing   Problem: Health Behavior/Discharge Planning: Goal: Ability to manage health-related needs will improve Outcome: Progressing   Problem: Clinical Measurements: Goal: Ability to maintain clinical measurements within normal limits will improve Outcome: Progressing Goal: Will remain free from infection Outcome: Progressing Goal: Diagnostic test results will improve Outcome: Progressing Goal: Respiratory complications will improve Outcome: Progressing Goal: Cardiovascular complication will be avoided Outcome: Progressing   Problem: Activity: Goal: Risk for activity intolerance will decrease Outcome: Progressing   Problem: Nutrition: Goal: Adequate nutrition will be maintained Outcome: Progressing

## 2022-01-23 NOTE — Progress Notes (Signed)
eLink Physician-Brief Progress Note Patient Name: Edouard Gikas DOB: 19-Dec-1985 MRN: [id number]   Date of Service  01/23/2022  HPI/Events of Note  Patient is craving vaping.  eICU Interventions  Nicoderm patch ordered.        Rosland Riding U Roberts Bon 01/23/2022, 1:22 AM

## 2022-01-24 DIAGNOSIS — F1910 Other psychoactive substance abuse, uncomplicated: Secondary | ICD-10-CM

## 2022-01-24 LAB — GLUCOSE, CAPILLARY: Glucose-Capillary: 156 mg/dL — ABNORMAL HIGH (ref 70–99)

## 2022-01-24 LAB — BASIC METABOLIC PANEL
Anion gap: 5 (ref 5–15)
BUN: 23 mg/dL — ABNORMAL HIGH (ref 6–20)
CO2: 25 mmol/L (ref 22–32)
Calcium: 8.9 mg/dL (ref 8.9–10.3)
Chloride: 106 mmol/L (ref 98–111)
Creatinine, Ser: 0.95 mg/dL (ref 0.61–1.24)
GFR, Estimated: >60 mL/min (ref >60)
Glucose, Bld: 104 mg/dL — ABNORMAL HIGH (ref 70–99)
Potassium: 3.9 mmol/L (ref 3.5–5.1)
Sodium: 136 mmol/L (ref 135–145)

## 2022-01-24 LAB — CK: Total CK: 2527 U/L — ABNORMAL HIGH (ref 49–397)

## 2022-01-24 LAB — MAGNESIUM: Magnesium: 2.1 mg/dL (ref 1.7–2.4)

## 2022-01-24 MED ORDER — CLONIDINE HCL 0.1 MG PO TABS: 0.1000 mg | ORAL_TABLET | Freq: Two times a day (BID) | ORAL | Status: DC

## 2022-01-24 MED ORDER — QUETIAPINE FUMARATE 50 MG PO TABS
50.0000 mg | ORAL_TABLET | Freq: Two times a day (BID) | ORAL | Status: DC
Start: 2022-01-24 — End: 2022-01-25
  Administered 2022-01-24 – 2022-01-25 (×3): 50 mg via ORAL
  Filled 2022-01-24 (×3): qty 1

## 2022-01-24 MED ORDER — CLONAZEPAM 1 MG PO TABS
2.0000 mg | ORAL_TABLET | Freq: Two times a day (BID) | ORAL | Status: DC
  Administered 2022-01-24 – 2022-01-25 (×3): 2 mg via ORAL
  Filled 2022-01-24 (×3): qty 2

## 2022-01-24 MED ORDER — CYCLOBENZAPRINE HCL 5 MG PO TABS
5.0000 mg | ORAL_TABLET | Freq: Three times a day (TID) | ORAL | Status: DC
Start: 2022-01-24 — End: 2022-01-29
  Administered 2022-01-24 – 2022-01-29 (×17): 5 mg via ORAL
  Filled 2022-01-24 (×17): qty 1

## 2022-01-24 MED ORDER — OXYCODONE HCL 5 MG PO TABS
5.0000 mg | ORAL_TABLET | ORAL | Status: AC | PRN
  Administered 2022-01-25 (×3): 5 mg via ORAL
  Filled 2022-01-24 (×3): qty 1

## 2022-01-24 MED ORDER — CLONIDINE HCL 0.1 MG PO TABS
0.1000 mg | ORAL_TABLET | Freq: Three times a day (TID) | ORAL | Status: DC
  Filled 2022-01-24: qty 1

## 2022-01-24 MED ORDER — CLONIDINE HCL 0.1 MG PO TABS: 0.1000 mg | ORAL_TABLET | Freq: Every day | ORAL | Status: DC

## 2022-01-24 MED ORDER — CLONIDINE HCL 0.2 MG PO TABS
0.2000 mg | ORAL_TABLET | Freq: Three times a day (TID) | ORAL | Status: AC
Start: 2022-01-24 — End: 2022-01-24
  Administered 2022-01-24 (×2): 0.2 mg via ORAL
  Filled 2022-01-24 (×2): qty 1

## 2022-01-24 MED ORDER — LACTATED RINGERS IV SOLN
INTRAVENOUS | Status: DC
Start: 2022-01-24 — End: 2022-01-24

## 2022-01-24 MED ORDER — LACTATED RINGERS IV SOLN: INTRAVENOUS | Status: AC

## 2022-01-24 MED ORDER — CLONIDINE HCL 0.3 MG PO TABS: 0.3000 mg | ORAL_TABLET | Freq: Three times a day (TID) | ORAL | Status: DC

## 2022-01-24 MED ORDER — CLONAZEPAM 1 MG PO TABS: 2.0000 mg | ORAL_TABLET | Freq: Two times a day (BID) | ORAL | Status: DC

## 2022-01-24 NOTE — Progress Notes (Signed)
eLink Physician-Brief Progress Note Patient Name: Erik Cowan DOB: 01/11/1986 MRN: [id number]   Date of Service  01/24/2022  HPI/Events of Note  Very anxious and C/O Pain, BSRN asking for Oxycodone and Ativan to be renewed  eICU Interventions  Additional 3 doses of oxycodone prn ordered Still has 4 doses of Ativan prn     Intervention Category Intermediate Interventions: Pain - evaluation and management Minor Interventions: Agitation / anxiety - evaluation and management  Darl Pikes 01/24/2022, 10:14 PM

## 2022-01-24 NOTE — Consult Note (Signed)
[facility] Face-to-Face Psychiatry Consult   Reason for Consult:  ''psychosis'' Referring Physician:  [name], MD Patient Identification: Erik Cowan MRN:  [id number] Principal Diagnosis: AMS (altered mental status) Diagnosis:  Principal Problem:   AMS (altered mental status) Active Problems:   Bipolar 1 disorder (HCC)   Acute renal failure (HCC)   Malnutrition of moderate degree   Polysubstance abuse (HCC)   Total Time spent with patient: 1 hour  Subjective:   Erik Cowan is a 36 y.o. male patient admitted with abdominal pain, nausea, vomiting and psychosis.  HPI:  36 y/o Male with PMH significant for Bipolar disorder, schizophrenia, cyclical vomiting syndrome, cannabis use, GERD who was brought in by EMS for abdominal pain, vomiting and confusion. Psychiatric consult was activated due to psychosis. Today, patient is alert, awake, oriented x 4. He reports long history of mental illness for which has had multiple inpatient admission. He states that he lost his job 4 weeks ago, as a result, he was not able to pay for his medications(Seroquel & Ativan). Patient reports that he became manic and started self medicating himself with illicit substance. U-tox was positive for Amphetamine, Cocaine, THC and benzo. Patient reports that he is doing better but still has mood swings, irritability, hearing voices, seeing devils and self harming thoughts. He is requesting for inpatient psychiatric admission.   Past Psychiatric History: as above  Risk to Self:  yes Risk to Others:  denies Prior Inpatient Therapy:  yes Prior Outpatient Therapy:  yes  Past Medical History:  Past Medical History:  Diagnosis Date   Bipolar 1 disorder (HCC)    Bipolar disorder (HCC)    Cyclic vomiting syndrome    GERD (gastroesophageal reflux disease)    Nephrolithiasis    Schizophrenia, acute (HCC)     Past Surgical History:  Procedure Laterality Date   APPENDECTOMY     unremarkable     Family  History:  Family History  Problem Relation Age of Onset   Liver cancer Mother    Colon cancer Maternal Uncle    Diabetes Maternal Aunt    Heart disease Paternal Grandfather    Testicular cancer Brother    Family Psychiatric  History:   Social History:  Social History   Substance and Sexual Activity  Alcohol Use Yes   Comment: occasional-once per month     Social History   Substance and Sexual Activity  Drug Use Yes   Frequency: 7.0 times per week   Types: Marijuana   Comment: smokes marijuana daily    Social History   Socioeconomic History   Marital status: Single    Spouse name: Not on file   Number of children: Not on file   Years of education: Not on file   Highest education level: Not on file  Occupational History   Occupation: KITCHEN CREW    Employer: [employer]    Comment: [employer]  Tobacco Use   Smoking status: Every Day    Years: 1.00    Types: Cigarettes    Last attempt to quit: 06/04/2007    Years since quitting: 14.6   Smokeless tobacco: Never   Tobacco comments:    see illicit drug comment  Substance and Sexual Activity   Alcohol use: Yes    Comment: occasional-once per month   Drug use: Yes    Frequency: 7.0 times per week    Types: Marijuana    Comment: smokes marijuana daily   Sexual activity: Never  Other Topics Concern  Not on file  Social History Narrative   Single.  Lives with his Aunt. Works part-time as a Buyer, retail.   Social Determinants of Health   Financial Resource Strain: Not on file  Food Insecurity: Not on file  Transportation Needs: Not on file  Physical Activity: Not on file  Stress: Not on file  Social Connections: Not on file   Additional Social History:    Allergies:   Allergies  Allergen Reactions   Gluten Meal Nausea And Vomiting    Stomach pains   Haldol [Haloperidol]     Makes me anxious    Milk-Related Compounds Nausea And Vomiting    Stomach upset   Latex Rash    Labs:   Results for orders placed or performed during the hospital encounter of 01/21/22 (from the past 48 hour(s))  Glucose, capillary     Status: None   Collection Time: 01/22/22  3:58 PM  Result Value Ref Range   Glucose-Capillary 85 70 - 99 mg/dL    Comment: Glucose reference range applies only to samples taken after fasting for at least 8 hours.  Glucose, capillary     Status: None   Collection Time: 01/22/22  8:02 PM  Result Value Ref Range   Glucose-Capillary 88 70 - 99 mg/dL    Comment: Glucose reference range applies only to samples taken after fasting for at least 8 hours.  Glucose, capillary     Status: Abnormal   Collection Time: 01/23/22 12:04 AM  Result Value Ref Range   Glucose-Capillary 105 (H) 70 - 99 mg/dL    Comment: Glucose reference range applies only to samples taken after fasting for at least 8 hours.  CBC     Status: Abnormal   Collection Time: 01/23/22 12:50 AM  Result Value Ref Range   WBC 14.0 (H) 4.0 - 10.5 K/uL   RBC 3.40 (L) 4.22 - 5.81 MIL/uL   Hemoglobin 11.7 (L) 13.0 - 17.0 g/dL   HCT 50.9 (L) 32.6 - 71.2 %   MCV 92.4 80.0 - 100.0 fL   MCH 34.4 (H) 26.0 - 34.0 pg   MCHC 37.3 (H) 30.0 - 36.0 g/dL   RDW 45.8 09.9 - 83.3 %   Platelets 127 (L) 150 - 400 K/uL   nRBC 0.0 0.0 - 0.2 %    Comment: Performed at [facility], [address] [place], [place] [address]  Basic metabolic panel     Status: Abnormal   Collection Time: 01/23/22 12:50 AM  Result Value Ref Range   Sodium 135 135 - 145 mmol/L   Potassium 3.9 3.5 - 5.1 mmol/L   Chloride 104 98 - 111 mmol/L   CO2 22 22 - 32 mmol/L   Glucose, Bld 107 (H) 70 - 99 mg/dL    Comment: Glucose reference range applies only to samples taken after fasting for at least 8 hours.   BUN 22 (H) 6 - 20 mg/dL   Creatinine, Ser 3.97 0.61 - 1.24 mg/dL   Calcium 9.2 8.9 - 67.3 mg/dL   GFR, Estimated >41 >93 mL/min    Comment: (NOTE) Calculated using the CKD-EPI Creatinine Equation (2021)    Anion gap 9 5 - 15     Comment: Performed at [facility], [address] [place], [place] [address]  Magnesium     Status: None   Collection Time: 01/23/22 12:50 AM  Result Value Ref Range   Magnesium 1.8 1.7 - 2.4 mg/dL    Comment:  Performed at [facility], [address] [place], [place] [address]  Phosphorus     Status: None   Collection Time: 01/23/22 12:50 AM  Result Value Ref Range   Phosphorus 3.4 2.5 - 4.6 mg/dL    Comment: Performed at [facility], [address] [place], [place] [address]  CK     Status: Abnormal   Collection Time: 01/23/22 12:50 AM  Result Value Ref Range   Total CK 3,535 (H) 49 - 397 U/L    Comment: Performed at [facility], [address] [place], [place] [address]  Glucose, capillary     Status: Abnormal   Collection Time: 01/23/22  4:12 AM  Result Value Ref Range   Glucose-Capillary 113 (H) 70 - 99 mg/dL    Comment: Glucose reference range applies only to samples taken after fasting for at least 8 hours.  Glucose, capillary     Status: None   Collection Time: 01/23/22  8:25 AM  Result Value Ref Range   Glucose-Capillary 92 70 - 99 mg/dL    Comment: Glucose reference range applies only to samples taken after fasting for at least 8 hours.  Glucose, capillary     Status: None   Collection Time: 01/23/22 12:12 PM  Result Value Ref Range   Glucose-Capillary 94 70 - 99 mg/dL    Comment: Glucose reference range applies only to samples taken after fasting for at least 8 hours.  Glucose, capillary     Status: Abnormal   Collection Time: 01/23/22  3:25 PM  Result Value Ref Range   Glucose-Capillary 105 (H) 70 - 99 mg/dL    Comment: Glucose reference range applies only to samples taken after fasting for at least 8 hours.  Glucose, capillary     Status: None   Collection Time: 01/23/22  7:24 PM  Result Value Ref Range   Glucose-Capillary 92 70 - 99 mg/dL    Comment: Glucose reference range applies only to samples taken after fasting for at least 8  hours.  Glucose, capillary     Status: Abnormal   Collection Time: 01/23/22  8:07 PM  Result Value Ref Range   Glucose-Capillary 105 (H) 70 - 99 mg/dL    Comment: Glucose reference range applies only to samples taken after fasting for at least 8 hours.  Glucose, capillary     Status: Abnormal   Collection Time: 01/23/22 11:17 PM  Result Value Ref Range   Glucose-Capillary 123 (H) 70 - 99 mg/dL    Comment: Glucose reference range applies only to samples taken after fasting for at least 8 hours.  Basic metabolic panel     Status: Abnormal   Collection Time: 01/24/22 12:31 AM  Result Value Ref Range   Sodium 136 135 - 145 mmol/L   Potassium 3.9 3.5 - 5.1 mmol/L   Chloride 106 98 - 111 mmol/L   CO2 25 22 - 32 mmol/L   Glucose, Bld 104 (H) 70 - 99 mg/dL    Comment: Glucose reference range applies only to samples taken after fasting for at least 8 hours.   BUN 23 (H) 6 - 20 mg/dL   Creatinine, Ser 3.47 0.61 - 1.24 mg/dL   Calcium 8.9 8.9 - 42.5 mg/dL   GFR, Estimated >95 >63 mL/min    Comment: (NOTE) Calculated using the CKD-EPI Creatinine Equation (2021)    Anion gap 5 5 - 15    Comment: Performed at [facility], [address] [place], [place] [address]  Magnesium     Status: None   Collection Time: 01/24/22 12:31 AM  Result Value Ref Range   Magnesium 2.1 1.7 - 2.4 mg/dL    Comment: Performed at [facility], [address] [place], [place] [address]  CK     Status: Abnormal   Collection Time: 01/24/22 12:31 AM  Result Value Ref Range   Total CK 2,527 (H) 49 - 397 U/L    Comment: Performed at [facility], [address] [place], [place] [address]  Glucose, capillary     Status: Abnormal   Collection Time: 01/24/22  3:06 AM  Result Value Ref Range   Glucose-Capillary 156 (H) 70 - 99 mg/dL    Comment: Glucose reference range applies only to samples taken after fasting for at least 8 hours.    Current Facility-Administered Medications  Medication Dose  Route Frequency Provider Last Rate Last Admin   0.9 %  sodium chloride infusion   Intravenous PRN Lorin GlassSmith, Daniel C, MD   Stopped at 01/24/22 [phone]   acetaminophen (TYLENOL) 160 MG/5ML solution 650 mg  650 mg Per Tube Q4H PRN Migdalia Dkgan, Okoronkwo U, MD   650 mg at 01/23/22 [phone]   bisacodyl (DULCOLAX) suppository 10 mg  10 mg Rectal Daily PRN Lorin GlassSmith, Daniel C, MD       capsaicin (ZOSTRIX) 0.025 % cream   Topical BID Melene PlanFloyd, Dan, DO   Given at 01/24/22 0900   Chlorhexidine Gluconate Cloth 2 % PADS 6 each  6 each Topical Q0600 Leslye PeerByrum, Robert S, MD   6 each at 01/24/22 0600   clonazePAM (KLONOPIN) tablet 2 mg  2 mg Oral BID Vicente SereneBarr, Grace E, RPH   2 mg at 01/24/22 0900   cloNIDine (CATAPRES) tablet 0.2 mg  0.2 mg Oral Q8H Lorin GlassSmith, Daniel C, MD       Followed by   Melene Muller[START ON 01/25/2022] cloNIDine (CATAPRES) tablet 0.1 mg  0.1 mg Oral Q8H Lorin GlassSmith, Daniel C, MD       Followed by   Melene Muller[START ON 01/26/2022] cloNIDine (CATAPRES) tablet 0.1 mg  0.1 mg Oral Q12H Lorin GlassSmith, Daniel C, MD       Followed by   Melene Muller[START ON 01/27/2022] cloNIDine (CATAPRES) tablet 0.1 mg  0.1 mg Oral Daily Lorin GlassSmith, Daniel C, MD       cyclobenzaprine (FLEXERIL) tablet 5 mg  5 mg Oral TID Lorin GlassSmith, Daniel C, MD   5 mg at 01/24/22 0900   docusate sodium (COLACE) capsule 100 mg  100 mg Oral BID PRN Gleason, Darcella GasmanLaura R, PA-C       enoxaparin (LOVENOX) injection 40 mg  40 mg Subcutaneous Q24H Lorin GlassSmith, Daniel C, MD   40 mg at 01/24/22 0900   feeding supplement (OSMOLITE 1.5 CAL) liquid 1,000 mL  1,000 mL Per Tube Continuous Lorin GlassSmith, Daniel C, MD   Stopped at 01/24/22 0800   hydrALAZINE (APRESOLINE) injection 5 mg  5 mg Intravenous Q4H PRN Lorin GlassSmith, Daniel C, MD   5 mg at 01/22/22 1955   lactated ringers infusion   Intravenous Continuous Lorin GlassSmith, Daniel C, MD 100 mL/hr at 01/24/22 0900 Infusion Verify at 01/24/22 0900   LORazepam (ATIVAN) injection 1-2 mg  1-2 mg Intravenous Q2H PRN Migdalia Dkgan, Okoronkwo U, MD   2 mg at 01/24/22 0703   nicotine (NICODERM CQ - dosed in mg/24 hours) patch 14 mg   14 mg Transdermal Daily Migdalia Dkgan, Okoronkwo U, MD   14 mg at 01/24/22 0900  ondansetron (ZOFRAN) injection 4 mg  4 mg Intravenous Q6H PRN Gleason, Darcella Gasman, PA-C   4 mg at 01/24/22 2841   Oral care mouth rinse  15 mL Mouth Rinse PRN Lorin Glass, MD       oxyCODONE (Oxy IR/ROXICODONE) immediate release tablet 5 mg  5 mg Oral Q4H PRN Migdalia Dk, MD   5 mg at 01/23/22 2056   polyethylene glycol (MIRALAX / GLYCOLAX) packet 17 g  17 g Oral Daily PRN Gleason, Darcella Gasman, PA-C   17 g at 01/23/22 3244   promethazine (PHENERGAN) 12.5 mg in sodium chloride 0.9 % 50 mL IVPB  12.5 mg Intravenous Q6H PRN Lorin Glass, MD   Stopped at 01/23/22 0825   QUEtiapine (SEROQUEL) tablet 50 mg  50 mg Oral BID Vicente Serene, RPH   50 mg at 01/24/22 0900    Musculoskeletal: Strength & Muscle Tone: within normal limits Gait & Station: normal Patient leans: N/A    Psychiatric Specialty Exam:  Presentation  General Appearance: Appropriate for Environment  Eye Contact:Good  Speech:Clear and Coherent  Speech Volume:Normal  Handedness:Right   Mood and Affect  Mood:Irritable  Affect:Congruent   Thought Process  Thought Processes:Coherent; Linear  Descriptions of Associations:Intact  Orientation:Full (Time, Place and Person)  Thought Content:Logical  History of Schizophrenia/Schizoaffective disorder:No data recorded Duration of Psychotic Symptoms:No data recorded Hallucinations:Hallucinations: Auditory; Visual  Ideas of Reference:None  Suicidal Thoughts:Suicidal Thoughts: Yes, Passive SI Passive Intent and/or Plan: With Intent; Without Plan  Homicidal Thoughts:Homicidal Thoughts: No   Sensorium  Memory:Immediate Good; Recent Good; Remote Good  Judgment:Intact  Insight:Poor   Executive Functions  Concentration:Fair  Attention Span:Fair  Recall:Good  Fund of Knowledge:Good  Language:Good   Psychomotor Activity  Psychomotor Activity:Psychomotor Activity:  Increased   Assets  Assets:Communication Skills; Desire for Improvement   Sleep  Sleep:Sleep: Fair   Physical Exam: Physical Exam Review of Systems  Psychiatric/Behavioral:  Positive for hallucinations, substance abuse and suicidal ideas. The patient is nervous/anxious.    Blood pressure (!) 139/94, pulse (!) 56, temperature 98.6 F (37 C), temperature source Oral, resp. rate 14, height 6\' 2"  (1.88 m), weight 64 kg, SpO2 100 %. Body mass index is 18.12 kg/m.  Treatment Plan Summary: 36 year old male with long history of mental illness and substance abuse who was admitted for altered mental status and psychosis. Patient reports ongoing mood swings, irritability, psychosis and self harming thoughts. He will benefit from inpatient psychiatric admission for stabilization after he is medically stabilized.  Plan Recommendations: -Continue Seroquel 50 mg twice daily for mood/psychosis --Consider social worker consult to facilitate inpatient psychiatric admission  Disposition: Recommend psychiatric Inpatient admission when medically cleared. Supportive therapy provided about ongoing stressors. Psychiatric consult service will continue to follow at this time 31, MD 01/24/2022 12:21 PM

## 2022-01-24 NOTE — Progress Notes (Addendum)
   NAME:  Erik Cowan, MRN:  [id number], DOB:  May 17, 1986, LOS: 2 ADMISSION DATE:  01/21/2022, CONSULTATION DATE:  01/24/22 REFERRING MD:  EDP, CHIEF COMPLAINT:  abdominal pain, confusion   History of Present Illness:  Erik Cowan is a 36 y/o M with PMH significant for cyclical vomiting syndrome, cannabis use, bipolar type 1, GERD, schizophrenia who was brought in by EMS for abdominal pain, vomiting and confusion.  History taken from Epic as patient is unable to give history and there is no family present.  He has reportedly been off ativan and seroquel for several days and thought he was passing kidney stones. Said he had fallen in the bathtub and his has back.   He was given IVF and droperidol, dilaudid and versed.  He became more agitated and required ketamine to obtain CT abd/pelvis.  Labs significant for creatinine of 1.9 from normal baseline, ck 488, WBC 20k.  Given his continued encephalopathy Precedex was ordered and PCCM consulted for admission  Pertinent  Medical History   has a past medical history of Bipolar 1 disorder (HCC), Bipolar disorder (HCC), Cyclic vomiting syndrome, GERD (gastroesophageal reflux disease), Nephrolithiasis, and Schizophrenia, acute (HCC).   Significant Hospital Events: Including procedures, antibiotic start and stop dates in addition to other pertinent events   8/25 admit to PCCM for precedex  Interim History / Subjective:  Much better this am. C/o back pain, anxiety. Off precedex. Sinus brady on monitor.  Objective   Blood pressure (!) 142/89, pulse (!) 48, temperature 98.3 F (36.8 C), temperature source Oral, resp. rate (!) 21, height 6\' 2"  (1.88 m), weight 64 kg, SpO2 100 %.        Intake/Output Summary (Last 24 hours) at 01/24/2022 0720 Last data filed at 01/24/2022 01/26/2022 Gross per 24 hour  Intake 4155.29 ml  Output 625 ml  Net 3530.29 ml    Filed Weights   01/22/22 0357 01/23/22 0500  Weight: 73.9 kg 64 kg    No distress Lungs  clear MM dry Moves all 4 ext to command Anxious, poor insight Aox3  CK improving Cr okay  Resolved Hospital Problem list     Assessment & Plan:  Acute agitated delirium/ psychosis/ encephalopathy-  in setting of (A) baseline schizophrenia/bipolar (B) stopping home meds and (C) doing PCP. 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 AKI and rhabdo- improved Bradycardia, junctional occasional- asymptomatic, will ease up as clonidine taper comes off  - Clonidine taper (to wean from precedex) - Seroquel standing - Increase klonipin - Trial of flexeril - Start diet, D/C TF and NGT - Transfer to floor - Await psychiatry input - May need placement in group home  Best Practice (right click and "Reselect all SmartList Selections" daily)   Diet/type: TF DVT prophylaxis: prophylactic heparin  GI prophylaxis: PPI Lines: N/A Foley:  N/A Code Status:  full code Last date of multidisciplinary goals of care discussion [pending]  01/25/22 MD PCCM

## 2022-01-25 DIAGNOSIS — E440 Moderate protein-calorie malnutrition: Secondary | ICD-10-CM

## 2022-01-25 DIAGNOSIS — R109 Unspecified abdominal pain: Secondary | ICD-10-CM

## 2022-01-25 DIAGNOSIS — F319 Bipolar disorder, unspecified: Secondary | ICD-10-CM

## 2022-01-25 LAB — GLUCOSE, CAPILLARY: Glucose-Capillary: 126 mg/dL — ABNORMAL HIGH (ref 70–99)

## 2022-01-25 LAB — CK: Total CK: 2744 U/L — ABNORMAL HIGH (ref 49–397)

## 2022-01-25 MED ORDER — PANTOPRAZOLE SODIUM 40 MG IV SOLR
40.0000 mg | Freq: Two times a day (BID) | INTRAVENOUS | Status: DC
  Administered 2022-01-25 – 2022-01-29 (×7): 40 mg via INTRAVENOUS
  Filled 2022-01-25 (×9): qty 10

## 2022-01-25 MED ORDER — BACLOFEN 10 MG PO TABS
5.0000 mg | ORAL_TABLET | Freq: Three times a day (TID) | ORAL | Status: DC | PRN
Start: 2022-01-25 — End: 2022-01-29
  Administered 2022-01-25 – 2022-01-29 (×9): 5 mg via ORAL
  Filled 2022-01-25 (×9): qty 1

## 2022-01-25 MED ORDER — ENSURE ENLIVE PO LIQD
237.0000 mL | Freq: Three times a day (TID) | ORAL | Status: DC
  Administered 2022-01-26 – 2022-01-29 (×2): 237 mL via ORAL

## 2022-01-25 MED ORDER — QUETIAPINE FUMARATE 50 MG PO TABS
50.0000 mg | ORAL_TABLET | Freq: Once | ORAL | Status: AC
  Administered 2022-01-25: 50 mg via ORAL
  Filled 2022-01-25: qty 1

## 2022-01-25 MED ORDER — QUETIAPINE FUMARATE 25 MG PO TABS
50.0000 mg | ORAL_TABLET | Freq: Three times a day (TID) | ORAL | Status: DC
  Administered 2022-01-25 – 2022-01-27 (×5): 50 mg via ORAL
  Filled 2022-01-25 (×2): qty 2
  Filled 2022-01-25: qty 1
  Filled 2022-01-25 (×2): qty 2

## 2022-01-25 MED ORDER — LORAZEPAM 2 MG/ML IJ SOLN
1.0000 mg | INTRAMUSCULAR | Status: AC | PRN
  Administered 2022-01-25 – 2022-01-26 (×4): 1 mg via INTRAVENOUS
  Filled 2022-01-25 (×4): qty 1

## 2022-01-25 MED ORDER — POLYETHYLENE GLYCOL 3350 17 G PO PACK
17.0000 g | PACK | Freq: Every day | ORAL | Status: AC
  Administered 2022-01-25: 17 g via ORAL
  Filled 2022-01-25 (×2): qty 1

## 2022-01-25 MED ORDER — CALCIUM CARBONATE ANTACID 500 MG PO CHEW
800.0000 mg | CHEWABLE_TABLET | Freq: Three times a day (TID) | ORAL | Status: DC | PRN
  Administered 2022-01-25 – 2022-01-26 (×3): 800 mg via ORAL
  Filled 2022-01-25 (×3): qty 4

## 2022-01-25 MED ORDER — SODIUM CHLORIDE 0.9 % IV SOLN: INTRAVENOUS | Status: AC

## 2022-01-25 MED ORDER — MORPHINE SULFATE (PF) 2 MG/ML IV SOLN
4.0000 mg | INTRAVENOUS | Status: DC | PRN
  Administered 2022-01-26 – 2022-01-27 (×7): 4 mg via INTRAVENOUS
  Filled 2022-01-25 (×7): qty 2

## 2022-01-25 MED ORDER — CLONAZEPAM 0.5 MG PO TABS
1.0000 mg | ORAL_TABLET | Freq: Two times a day (BID) | ORAL | Status: DC
  Administered 2022-01-25 – 2022-01-26 (×2): 1 mg via ORAL
  Filled 2022-01-25: qty 2
  Filled 2022-01-25: qty 1

## 2022-01-25 MED ORDER — HALOPERIDOL LACTATE 5 MG/ML IJ SOLN
5.0000 mg | Freq: Four times a day (QID) | INTRAMUSCULAR | Status: DC | PRN
  Administered 2022-01-25 – 2022-01-26 (×2): 5 mg via INTRAVENOUS
  Filled 2022-01-25 (×4): qty 1

## 2022-01-25 NOTE — Consult Note (Signed)
[facility] Psychiatry New Face-to-Face Psychiatric Evaluation  Date of Service: January 25, 2022 Name: Erik Cowan DOB: Nov 01, 1985 MRN: [id number] Reason for Consult: "Psychosis" Requesting Provider: Marinda Elk, MD  Assessment  Erik Cowan is a 36 y.o. male admitted medically for 01/21/2022  8:39 PM for AMS in the setting of fall. He carries the psychiatric diagnoses of bipolar affective d/o, opiate use d/o (percocet unprescribed), cannabis use d/o w cannabis hyperemesis, tobacco use d/o, past SI (2016).  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 Home meds included ativan and seroquel 200 mg daily, which patient ran out of in May because he no longer is employed. He reports h/o bipolar d/o and szc with sxs onset at 36years old. Unclear if patient has bipolar d/o/scz vs Substance-induced psychosis in the past. Patient's first psych hospitalization was 2016. Reported sxs of "acting manic, throwing shit, breaking things, kicking stuff" decreased need for sleep  about 5 hours of sleep total in the week leading up to this), evaluation was interupted by patient's N/V. Plan to re-evaluate again 8/29. Increased seroquel per below to help with N/V and tapering patient off of klonopin due to no active prescriber and patient has not been on it in months.   Cannabis hyperemesis Long standing hx, since 2016. Trialed haldol which helped in the past, however patient declined due to side effects of akathisia. Currently on seroquel, which was increased per below. Will consider zyprexa.   Please see plan below for detailed recommendations.   Diagnoses:  Active Hospital problems: Principal Problem:   AMS (altered mental status) Active Problems:   Bipolar 1 disorder (HCC)   Acute renal failure (HCC)   Malnutrition of  moderate degree   Polysubstance abuse (HCC)   Plan  ## Safety and Observation Level:  - Based on my clinical evaluation, I estimate the patient to be at low risk of self harm in the current setting - At this time, we recommend a routine level of observation. This decision is based on my review of the chart including patient's history and current presentation, interview of the patient, mental status examination, and consideration of suicide risk including evaluating suicidal ideation, plan, intent, suicidal or self-harm behaviors, risk factors, and protective factors. This judgment is based on our ability to directly address suicide risk, implement suicide prevention strategies and develop a safety plan while the patient is in the clinical setting. Please contact our team if there is a concern that risk level has changed.  ## Medications:  -- INCREASED seroquel 50 mg BID to TID -- TAPERED klonopin 2 mg BID to 1 mg BID. Plan to further taper to 0.5 mg BID > 0.5 mg qHS > dc  ## Medical Decision Making Capacity:  Formal decision making capacity was not assessed for any particular decision as part of routine psychiatric evaluation   ## Further Work-up:  -- Recommend TSH, lipid panel  -- Most recent EKG on 01/23/22 had QtC of 456 -- Antipsychotic monitoring: A1c 5.0 (01/22/2022)  -- Pertinent labwork reviewed earlier this admission includes:  UDS + amphetamines, bezos, cocaine, THC CK 488> 2727 > 3535 > 2527 > 2744 B12 WNL Hep A IgM, Hep B Sah, Hep B core IgM, HCV Ab, HIV - all NR  ## Disposition:  -- Will re-evaluate for inpatient psych 8/29  ## Behavioral / Environmental:  -- COWS per  protocol  -- Delirium precautions  ##Legal Status -- Voluntary   Thank you for this consult request. Recommendations have been communicated to the primary team. We will continue to follow at this time  Princess Bruins, DO  History  Relevant Aspects of Hospital Course:  Admitted on 01/21/2022 for AMS  (altered mental status) [R41.82].  Patient Report:  Brothers x2 at bedside, who stayed during evaluation with patient's consent.  Patient seen around 10:30AM. Generally alert, oriented, with good attention. First came in after being found down after falling in the shower from dizziness. Was found with vomitus on him. Last remembers hospital picking him up and coming to the hospital, does not remember the past few days or seeing psychiatry team over the weekend.   Over past 2 weeks, patient experienced on and off "manic episodes" where he would "acting manic, throwing shit, breaking things, kicking stuff"; estiamtes he got about 5 hours of sleep total in the week leading up to this. He attributed this episode due to running out of Ativan and Seroquel. Within this time he lost his job and his "girl" during this time as well. Lost his job at the beginning of August. Started having these manic episodes around age 85 (2016). Psych hx was interrupted by patient's N/V.  Patient has a h/o of cyclic vomiting 2/2 cannabis overuse for years. N/V is relieved with hot showers "Can stay in the shower for hours". This AM had eggs and sausage for breakfast, which patient could not tolerate. Had stomach cramps with N/V. Per nurse has thrown up 7-800 cc so far today. Currently on IVF.   ROS:  Mood "tired" Sleep was poor Appetite decreased due to vomiting.   Today, patient denied SI/HI/AVH. Denied paranoia. Did not appear internally pre-occupied.   Collateral information:  Brothers were at bedside and confirmed HPI.   Psychiatric History:  Information collected from chart review, family and patient Dx: Scz, bipolar d/o, bipolar affective disorder, cannabis use disorder, tobacco use d/o Inpatient psych: yes multi Outpatient psych: denied  Family psych history: Deferred  Social History:  Cannabis: Yes Tobacco use: He smokes a pack every 2 days.  Alcohol use: Rarely, does not remember last time Drug use:  He also takes 30 mg of percocet 3-4 times a day for some time. This is not prescribed to him. Also possible benzo misuse.   Family History:  The patient's family history includes Colon cancer in his maternal uncle; Diabetes in his maternal aunt; Heart disease in his paternal grandfather; Liver cancer in his mother; Testicular cancer in his brother.  Medical History: Past Medical History:  Diagnosis Date   Bipolar 1 disorder (HCC)    Bipolar disorder (HCC)    Cyclic vomiting syndrome    GERD (gastroesophageal reflux disease)    Nephrolithiasis    Schizophrenia, acute (HCC)    Surgical History: Past Surgical History:  Procedure Laterality Date   APPENDECTOMY     unremarkable      Medications:   Current Facility-Administered Medications:    0.9 %  sodium chloride infusion, , Intravenous, PRN, Lorin Glass, MD, Stopped at 01/24/22 0711   0.9 %  sodium chloride infusion, , Intravenous, Continuous, Marinda Elk, MD, Last Rate: 125 mL/hr at 01/25/22 1500, Infusion Verify at 01/25/22 1500   baclofen (LIORESAL) tablet 5 mg, 5 mg, Oral, TID PRN, Marinda Elk, MD, 5 mg at 01/25/22 1124   bisacodyl (DULCOLAX) suppository 10 mg, 10 mg, Rectal, Daily PRN, Lorin Glass, MD  capsaicin (ZOSTRIX) 0.025 % cream, , Topical, BID, Melene Plan, DO, Given at 01/25/22 0908   Chlorhexidine Gluconate Cloth 2 % PADS 6 each, 6 each, Topical, Q0600, Leslye Peer, MD, 6 each at 01/24/22 1819   clonazePAM (KLONOPIN) tablet 1 mg, 1 mg, Oral, BID, Princess Bruins, DO   cyclobenzaprine (FLEXERIL) tablet 5 mg, 5 mg, Oral, TID, Lorin Glass, MD, 5 mg at 01/25/22 1517   docusate sodium (COLACE) capsule 100 mg, 100 mg, Oral, BID PRN, Gleason, Darcella Gasman, PA-C   enoxaparin (LOVENOX) injection 40 mg, 40 mg, Subcutaneous, Q24H, Lorin Glass, MD, 40 mg at 01/25/22 5102   feeding supplement (ENSURE ENLIVE / ENSURE PLUS) liquid 237 mL, 237 mL, Oral, TID BM, Marinda Elk, MD   haloperidol  lactate (HALDOL) injection 5 mg, 5 mg, Intravenous, Q6H PRN, Marinda Elk, MD, 5 mg at 01/25/22 1558   hydrALAZINE (APRESOLINE) injection 5 mg, 5 mg, Intravenous, Q4H PRN, Lorin Glass, MD, 5 mg at 01/22/22 1955   LORazepam (ATIVAN) injection 1 mg, 1 mg, Intravenous, Q4H PRN, Jeannette Corpus T, MD, 1 mg at 01/25/22 1357   morphine (PF) 2 MG/ML injection 4 mg, 4 mg, Intravenous, Q4H PRN, Marinda Elk, MD   nicotine (NICODERM CQ - dosed in mg/24 hours) patch 14 mg, 14 mg, Transdermal, Daily, Ogan, [place]wo U, MD, 14 mg at 01/25/22 0913   ondansetron (ZOFRAN) injection 4 mg, 4 mg, Intravenous, Q6H PRN, Gleason, Darcella Gasman, PA-C, 4 mg at 01/25/22 1356   Oral care mouth rinse, 15 mL, Mouth Rinse, PRN, Lorin Glass, MD   pantoprazole (PROTONIX) injection 40 mg, 40 mg, Intravenous, Q12H, Marinda Elk, MD, 40 mg at 01/25/22 1121   polyethylene glycol (MIRALAX / GLYCOLAX) packet 17 g, 17 g, Oral, Daily PRN, Gleason, Darcella Gasman, PA-C, 17 g at 01/23/22 0926   polyethylene glycol (MIRALAX / GLYCOLAX) packet 17 g, 17 g, Oral, Daily, Marinda Elk, MD, 17 g at 01/25/22 0916   promethazine (PHENERGAN) 12.5 mg in sodium chloride 0.9 % 50 mL IVPB, 12.5 mg, Intravenous, Q6H PRN, Lorin Glass, MD, Last Rate: 200 mL/hr at 01/25/22 1603, 12.5 mg at 01/25/22 1603   QUEtiapine (SEROQUEL) tablet 50 mg, 50 mg, Oral, Q8H, Princess Bruins, DO  Allergies: Allergies  Allergen Reactions   Gluten Meal Nausea And Vomiting    Stomach pains   Haldol [Haloperidol]     Makes me anxious    Milk-Related Compounds Nausea And Vomiting    Stomach upset   Latex Rash     Objective  Vital signs:  BMI Body mass index is 18.12 kg/m. Temp:  [97.3 F (36.3 C)-98.5 F (36.9 C)] 98.5 F (36.9 C) (08/28 1540) Pulse Rate:  [50-55] 55 (08/28 1540) Resp:  [14-24] 20 (08/28 1540) BP: (126-151)/(79-106) 151/100 (08/28 1540) SpO2:  [99 %-100 %] 99 % (08/28 1540)  Psychiatric Specialty Exam: General  Appearance: Appropriate for Environment; Fairly Groomed   Eye Contact: Fair   Speech: Clear and Coherent; Normal Rate   Volume: Normal    Mood: -- ("tired")  Affect: Congruent; Appropriate; Restricted    Thought Process: Coherent; Goal Directed  Descriptions of Associations: Circumstantial   Orientation: Full (Time, Place and Person)   Thought Content: Scattered (Tired of N/V)  Hallucinations: None   Ideas of Reference: None   Suicidal Thoughts: No  Homicidal Thoughts: No   Memory: Immediate Fair    Judgement: Fair  Insight: Shallow    Psychomotor Activity:  Normal    Concentration: Fair  Attention Span: Fair  Recall: [employer] of Knowledge: Fair    Language: Good    Handed: Right    Assets: Communication Skills; Desire for Improvement; Housing; Social Support; Resilience    Sleep: Poor (Due to hospital noises)    AIMS:   ,  ,  ,  ,  ,   CIWA:  COWS:     Physical Exam: Physical Exam Vitals and nursing note reviewed.  Constitutional:      Appearance: He is not ill-appearing.  HENT:     Head: Normocephalic.  Pulmonary:     Effort: Pulmonary effort is normal. No respiratory distress.  Skin:    General: Skin is warm and dry.  Neurological:     Mental Status: He is alert.  Psychiatric:        Behavior: Behavior is cooperative.     Signed: Princess Bruins, DO Psychiatry Resident, PGY-2 Psychiatry Consult Service [facility] 01/25/2022, 5:09 PM

## 2022-01-25 NOTE — Progress Notes (Signed)
Pt is sitting on the floor rocking back and forth, refusing to get back into bed or chair. States that "his bipolar is acting up". Primary MD and Psych have been notified. Pt has not fallen, just refuses to get off floor.

## 2022-01-25 NOTE — TOC Initial Note (Signed)
Transition of Care [facility]) - Initial/Assessment Note    Patient Details  Name: Erik Cowan MRN: [id number] Date of Birth: 1985-09-03  [facility] CM/SW Contact:    Milinda Antis, [place] Phone Number: 01/25/2022, 1:53 PM  Clinical Narrative:                 CSW met with patient and patient's brother's at bedside.  The family is aware that the patient will be admitted to inpatient psych after being discharge from the hospital, but wanted to inquire about SA resources.  The patient was in agreement with going to inpatient psych @ discharge from the hospital and then an inpatient SA treatment center after discharge from the psych hospital.  [facility] provided the family substance use resources.  TOC will continue to follow.  Expected Discharge Plan: Psychiatric Hospital Barriers to Discharge: Continued Medical Work up   Patient Goals and CMS Choice Patient states their goals for this hospitalization and ongoing recovery are:: Rehab      Expected Discharge Plan and Services Expected Discharge Plan: [facility]       Living arrangements for the past 2 months: [place]                                      Prior Living Arrangements/Services Living arrangements for the past 2 months: Single Family Home   Patient language and need for interpreter reviewed:: No Do you feel safe going back to the place where you live?: Yes      Need for Family Participation in Patient Care: Yes (Comment) Care giver support system in place?: Yes (comment)   Criminal Activity/Legal Involvement Pertinent to Current Situation/Hospitalization: No - Comment as needed  Activities of Daily Living      Permission Sought/Granted Permission sought to share information with : Family Supports, Other (comment) (CSW) Permission granted to share information with : Yes, Verbal Permission Granted              Emotional Assessment Appearance:: Appears stated age,  Disheveled Attitude/Demeanor/Rapport: Paranoid, Engaged Affect (typically observed): Restless Orientation: : Oriented to Self, Oriented to Place, Oriented to  Time Alcohol / Substance Use: Illicit Drugs Psych Involvement: Yes (comment)  Admission diagnosis:  AMS (altered mental status) [R41.82] Patient Active Problem List   Diagnosis Date Noted   Polysubstance abuse ([place]) 01/24/2022   Malnutrition of moderate degree 01/23/2022   AMS (altered mental status) 01/22/2022   Intractable nausea and vomiting 01/22/2021   Acute renal failure ([place]) 40/34/7425   Metabolic acidosis, increased anion gap (IAG) 01/22/2021   Cannabis use disorder, severe, dependence ([place]) 03/19/2015   Bipolar 1 disorder ([place]) 03/19/2015   Heart murmur 05/23/2012   Dehydration, mild 05/23/2012   Hyponatremia 06/04/2011   Hypokalemia 06/04/2011   Leukocytosis 06/04/2011   GERD (gastroesophageal reflux disease)    Nephrolithiasis    Cyclic vomiting syndrome    Abdominal pain, epigastric 06/10/2010   PCP:  Patient, No Pcp Per Pharmacy:   [id number] [place] [place] [address]Phone: [phone]: [phone] [facility]Phone: [phone]: [phone] [id number] [place]  [place] [address] Phone: [phone] Fax: [phone]     Social Determinants of Health (SDOH) Interventions    Readmission Risk Interventions     No data to display

## 2022-01-25 NOTE — Progress Notes (Addendum)
Nutrition Follow-up  DOCUMENTATION CODES:   Non-severe (moderate) malnutrition in context of chronic illness  INTERVENTION:   Gluten Free diet  Encourage PO intake as able; reviewed menu options with patient  Will offer assistance with menu ordering   Ensure Enlive po TID, each supplement provides 350 kcal and 20 grams of protein.    NUTRITION DIAGNOSIS:   Moderate Malnutrition related to chronic illness (cyclical vomiting syndrome) as evidenced by mild fat depletion, mild muscle depletion. Ongoing  GOAL:   Patient will meet greater than or equal to 90% of their needs Progressing with diet advancement   MONITOR:   TF tolerance, Labs, Diet advancement  REASON FOR ASSESSMENT:   Consult Enteral/tube feeding initiation and management (to begin tomorrow morning)  ASSESSMENT:   36 yo male admitted with abdominal pain, vomiting, confusion. PMH includes bipolar d/o, schizophrenia, cyclical vomiting syndrome, cannabis use, GERD.  Spoke with pt who reports they are not feeling well. Has had emesis all day today per RN and pt. He reports that he has issues with vomiting since young due to various issues, sometimes food. Pt states that foods with gluten give him issues. He was able to list foods that he typically tolerates and knows he can order these foods at meal time. He is agreeable to ensure and has had them in the past.  Pt had a cortrak tube placed 8/25 and started on TF, cortrak removed 8/27 and diet advanced.   Labs reviewed  Medications reviewed and include protonix, miralax NS @ 125 ml/hr Phenergan     Diet Order:   Diet Order             Diet gluten free Room service appropriate? Yes with Assist; Fluid consistency: Thin  Diet effective now                   EDUCATION NEEDS:   Not appropriate for education at this time  Skin:  Skin Assessment: Reviewed RN Assessment  Last BM:  no BM documented  Height:   Ht Readings from Last 1 Encounters:   01/22/22 6\' 2"  (1.88 m)    Weight:   Wt Readings from Last 1 Encounters:  01/23/22 64 kg    Ideal Body Weight:  86.4 kg  BMI:  Body mass index is 18.12 kg/m.  Estimated Nutritional Needs:   Kcal:  2400-2600  Protein:  110-130 gm  Fluid:  2.4-2.6 L   01/25/22 RD, LDN, CNSC Please refer to Amion for contact information.

## 2022-01-25 NOTE — Progress Notes (Signed)
[facility] PROGRESS NOTE    Progress Note  Erik Cowan  UXN:[id number] DOB: 17-Jan-1986 DOA: 01/21/2022 PCP: Patient, No Pcp Per     Brief Narrative:   Erik Cowan is an 36 y.o. male past medical history of cyclic vomiting syndrome, cannabis use, type I bipolar disorder and possibly schizophrenia who is brought in by EMS for abdominal pain nausea vomiting and confusion, the patient related he had passed a kidney stone was started on IV droperidol Dilaudid and Versed then became more agitated requiring ketamine CT scan of the abdomen and pelvis was done showed no evidence of stone or acute abnormalities.  Had a mild rise in his creatinine, with a white count of 20,000, and a CK of 500.  Given his encephalopathy he was transferred to the ICU and started on Precedex    Significant Events: 01/22/2022 admitted to the ICU service on Precedex  Assessment/Plan:   Acute confusional state/psychosis/incident acute metabolic encephalopathy: With a baseline of schizophrenia and bipolar disorder Home meds were stopped. Started on Seroquel, Klonopin was increased. He was also given a trial of Flexeril. Psych was consulted and recommended continued cervical and also recommended inpatient psychiatric admission. Discontinue clonidine, use Haldol IV as needed for agitation.  Acute kidney injury/rhabdomyolysis: Resolved with fluid resuscitation. Creatinine returned to baseline.  Pain related to constipation: Continue MiraLAX.  Hyperemesis cannabis syndrome/chronic marijuana use: Noted.  Junctional bradycardia: In a 36 year old currently asymptomatic.     DVT prophylaxis: lovenox Family Communication:none Status is: Inpatient Remains inpatient appropriate because: Acute agitation and aggression likely due to medication use    Code Status:     Code Status Orders  (From admission, onward)           Start     Ordered   01/22/22 0521  Full code  Continuous         01/22/22 0520           Code Status History     Date Active Date Inactive Code Status Order ID Comments User Context   01/21/2021 2156 01/26/2021 2303 Full Code [id number]  Anselm Jungling, DO ED   03/18/2015 1836 03/20/2015 1542 Full Code [id number]  Pricilla Loveless, MD ED   05/23/2012 0205 05/25/2012 1912 Full Code [id number]  Melodie Bouillon, RN Inpatient   06/04/2011 1810 06/07/2011 1804 Full Code [id number]  Skipper Cliche, RN Inpatient         IV Access:   Peripheral IV   Procedures and diagnostic studies:   No results found.   Medical Consultants:   None.   Subjective:    Erik Cowan no complaints this morning, appears calm.  Objective:    Vitals:   01/25/22 0400 01/25/22 0500 01/25/22 0553 01/25/22 0600  BP:   131/89   Pulse: (!) 50     Resp: (!) 21 20 18  (!) 21  Temp: 98.3 F (36.8 C)     TempSrc: Oral     SpO2: 99%  100%   Weight:      Height:       SpO2: 100 %   Intake/Output Summary (Last 24 hours) at 01/25/2022 0721 Last data filed at 01/24/2022 1500 Gross per 24 hour  Intake 833.03 ml  Output --  Net 833.03 ml   Filed Weights   01/22/22 0357 01/23/22 0500  Weight: 73.9 kg 64 kg    Exam: General exam: In no acute distress. Respiratory system: Good air movement and clear to auscultation. Cardiovascular system: S1 & S2 heard,  RRR. No JVD. Gastrointestinal system: Abdomen is nondistended, soft and nontender.  Extremities: No pedal edema. Skin: No rashes, lesions or ulcers Psychiatry: Judgement and insight appear normal. Mood & affect appropriate.    Data Reviewed:    Labs: Basic Metabolic Panel: Recent Labs  Lab 01/21/22 2125 01/22/22 0534 01/22/22 0748 01/23/22 0050 01/24/22 0031  NA 137  --  131* 135 136  K 3.5  --  4.7 3.9 3.9  CL 98  --  102 104 106  CO2 20*  --  22 22 25   GLUCOSE 145*  --  118* 107* 104*  BUN 27*  --  26* 22* 23*  CREATININE 1.95* 1.26* 1.04 0.97 0.95  CALCIUM 11.2*  --  9.4 9.2 8.9  MG 1.8   --   --  1.8 2.1  PHOS  --   --   --  3.4  --    GFR Estimated Creatinine Clearance: 97.3 mL/min (by C-G formula based on SCr of 0.95 mg/dL). Liver Function Tests: Recent Labs  Lab 01/21/22 2125  AST 47*  ALT 29  ALKPHOS 82  BILITOT 1.0  PROT 9.2*  ALBUMIN 5.4*   Recent Labs  Lab 01/21/22 2125  LIPASE 21   Recent Labs  Lab 01/22/22 0748  AMMONIA 28   Coagulation profile No results for input(s): "INR", "PROTIME" in the last 168 hours. COVID-19 Labs  No results for input(s): "DDIMER", "FERRITIN", "LDH", "CRP" in the last 72 hours.  Lab Results  Component Value Date   SARSCOV2NAA NEGATIVE 01/21/2021    CBC: Recent Labs  Lab 01/21/22 2125 01/22/22 0534 01/22/22 0748 01/23/22 0050  WBC 20.0* 17.2* 18.3* 14.0*  NEUTROABS 17.4*  --   --   --   HGB 14.8 12.4* 13.0 11.7*  HCT 41.6 33.2* 35.1* 31.4*  MCV 95.0 92.7 92.4 92.4  PLT 187 163 145* 127*   Cardiac Enzymes: Recent Labs  Lab 01/21/22 2125 01/22/22 0748 01/23/22 0050 01/24/22 0031  CKTOTAL 488* 2,727* 3,535* 2,527*   BNP (last 3 results) No results for input(s): "PROBNP" in the last 8760 hours. CBG: Recent Labs  Lab 01/23/22 1525 01/23/22 1924 01/23/22 2007 01/23/22 2317 01/24/22 0306  GLUCAP 105* 92 105* 123* 156*   D-Dimer: No results for input(s): "DDIMER" in the last 72 hours. Hgb A1c: Recent Labs    01/22/22 0748  HGBA1C 5.0   Lipid Profile: No results for input(s): "CHOL", "HDL", "LDLCALC", "TRIG", "CHOLHDL", "LDLDIRECT" in the last 72 hours. Thyroid function studies: No results for input(s): "TSH", "T4TOTAL", "T3FREE", "THYROIDAB" in the last 72 hours.  Invalid input(s): "FREET3" Anemia work up: No results for input(s): "VITAMINB12", "FOLATE", "FERRITIN", "TIBC", "IRON", "RETICCTPCT" in the last 72 hours. Sepsis Labs: Recent Labs  Lab 01/21/22 2125 01/22/22 0534 01/22/22 0748 01/23/22 0050  WBC 20.0* 17.2* 18.3* 14.0*   Microbiology Recent Results (from the past 240  hour(s))  MRSA Next Gen by PCR, Nasal     Status: None   Collection Time: 01/22/22  6:11 AM   Specimen: Nasal Mucosa; Nasal Swab  Result Value Ref Range Status   MRSA by PCR Next Gen NOT DETECTED NOT DETECTED Final    Comment: (NOTE) The GeneXpert MRSA Assay (FDA approved for NASAL specimens only), is one component of a comprehensive MRSA colonization surveillance program. It is not intended to diagnose MRSA infection nor to guide or monitor treatment for MRSA infections. Test performance is not FDA approved in patients less than 63 years old. Performed at [facility], [address] [place], [place] [address]      Medications:    capsaicin   Topical BID   Chlorhexidine Gluconate Cloth  6 each Topical Q0600   clonazePAM  2 mg Oral BID   cloNIDine  0.1 mg Oral Q8H   Followed by   Melene Muller ON 01/26/2022] cloNIDine  0.1 mg Oral Q12H   Followed by   Melene Muller ON 01/27/2022] cloNIDine  0.1 mg Oral Daily   cyclobenzaprine  5 mg Oral TID   enoxaparin (LOVENOX) injection  40 mg Subcutaneous Q24H   nicotine  14 mg Transdermal Daily   QUEtiapine  50 mg Oral BID   Continuous Infusions:  sodium chloride Stopped (01/24/22 0711)   feeding supplement (OSMOLITE 1.5 CAL) Stopped (01/24/22 0800)   lactated ringers 100 mL/hr at 01/24/22 2000   promethazine (PHENERGAN) injection (IM or IVPB) 12.5 mg (01/24/22 2024)      LOS: 3 days   Marinda Elk  [facility]  01/25/2022, 7:21 AM

## 2022-01-26 LAB — CK: Total CK: 4112 U/L — ABNORMAL HIGH (ref 49–397)

## 2022-01-26 MED ORDER — LORAZEPAM 2 MG/ML IJ SOLN: 1.0000 mg | Freq: Once | INTRAMUSCULAR | Status: DC

## 2022-01-26 MED ORDER — SODIUM CHLORIDE 0.9 % IV BOLUS
2000.0000 mL | Freq: Once | INTRAVENOUS | Status: AC
  Administered 2022-01-26: 2000 mL via INTRAVENOUS

## 2022-01-26 MED ORDER — CLONAZEPAM 0.5 MG PO TABS
0.5000 mg | ORAL_TABLET | Freq: Two times a day (BID) | ORAL | Status: DC
  Administered 2022-01-27: 0.5 mg via ORAL
  Filled 2022-01-26: qty 1

## 2022-01-26 MED ORDER — ALUM & MAG HYDROXIDE-SIMETH 200-200-20 MG/5ML PO SUSP
30.0000 mL | ORAL | Status: DC | PRN
  Administered 2022-01-26 – 2022-01-28 (×9): 30 mL via ORAL
  Filled 2022-01-26 (×10): qty 30

## 2022-01-26 MED ORDER — SODIUM CHLORIDE 0.9 % IV SOLN: INTRAVENOUS | Status: DC

## 2022-01-26 MED ORDER — LORAZEPAM 2 MG/ML IJ SOLN
INTRAMUSCULAR | Status: AC
  Administered 2022-01-26: 2 mg
  Filled 2022-01-26: qty 1

## 2022-01-26 MED ORDER — CLONAZEPAM 0.5 MG PO TABS
1.0000 mg | ORAL_TABLET | Freq: Two times a day (BID) | ORAL | Status: AC
  Administered 2022-01-26: 1 mg via ORAL
  Filled 2022-01-26: qty 2

## 2022-01-26 MED ORDER — LORAZEPAM 2 MG/ML IJ SOLN
1.0000 mg | Freq: Once | INTRAMUSCULAR | Status: AC
  Administered 2022-01-26: 1 mg via INTRAMUSCULAR
  Filled 2022-01-26: qty 1

## 2022-01-26 NOTE — Progress Notes (Addendum)
Erik Cowan Received and Discharged

## 2022-01-26 NOTE — Consult Note (Signed)
[facility] New Face-to-Face Psychiatric Evaluation  Date of Service: January 26, 2022 Name: Erik Cowan DOB: March 02, 1986 MRN: [id number] Reason for Consult: "Psychosis" Requesting Provider: Marinda Elk, MD  Assessment  Cristiano Capri is a 36 y.o. male admitted medically for 01/21/2022  8:39 PM for AMS in the setting of fall. He carries the psychiatric diagnoses of bipolar affective d/o, opiate use d/o (percocet unprescribed), cannabis use d/o w cannabis hyperemesis, tobacco use d/o, past SI (2016). Presented to [facility] (01/21/2022) after being found down, covered in vomitus after falling in shower. Also had abdominal pain with N/V.   Active suicidal ideation In the setting of persistent unbearable N/V/abdominal pain, medication non-adherence due to financial stressors, recent job loss (6 months prior to admission), and recent breakup with girlfriend (2 weeks prior admission). Endorsed plan to either hang self, shoot self, jump off building. Patient did not attempt due to living with brother and sense of responsibility to 9yo daughter.  Protective factors: responsibility to 9yo daughter, positive familial support, living with family. Patient denied access to guns, further safety planning with collateral required. Patient is high risk of suicide and will be transferred inpatient psych hospitalization once medically stable. 8/29 patient was amenable to psych hospitalization.  High CK CK ~4000s, initially concerned for NMS due to inc seroquel last night. However, afebrile and on exam, no rigidity or cogwheeling on exam so will continue seroquel for now. Re-evaluating 8/30.  Cannabis hyperemesis, improving Long standing hx, since 2016. Trialed haldol which helped in the past, however patient declined due to side effects of akathisia. Currently on seroquel, which was increased per below. Will consider zyprexa if still having breakthrough N/V.   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 Home meds included ativan and seroquel 200 mg daily, which patient ran out of in May because he no longer is employed. He reports h/o bipolar d/o and szc with sxs onset at 36years old. Unclear if patient has bipolar d/o/scz vs Substance-induced psychosis in the past. Patient's first psych hospitalization was 2016. Reported sxs of "acting manic, throwing shit, breaking things, kicking stuff" decreased need for sleep (about 5 hours of sleep total in the week leading up to this), evaluation was interupted by patient's N/V. Plan to re-evaluate again 8/29. Tapering patient off of klonopin due to no active prescriber and patient has not been on it in months.   Please see plan below for detailed recommendations.   Diagnoses:  Active Hospital problems: Principal Problem:   AMS (altered mental status) Active Problems:   Bipolar 1 disorder (HCC)   Acute renal failure (HCC)   Malnutrition of moderate degree   Polysubstance abuse (HCC)   Plan  ## Safety and Observation Level:  - Based on my clinical evaluation, I estimate the patient to be at high risk of self harm in the current setting - At this time, we recommend a 1:1 level of observation. This decision is based on my review of the chart including patient's history and current presentation, interview of the patient, mental status examination, and consideration of suicide risk including evaluating suicidal ideation, plan, intent, suicidal or self-harm behaviors, risk factors, and protective factors. This judgment is based on our ability to directly address suicide risk, implement suicide prevention strategies and develop a safety plan while the patient is in the clinical setting. Please contact our team  if there is a concern that risk level has changed.  ## Medications:   -- CONTINUED seroquel 50 mg TID  -- TAPERED klonopin 1 mg BID to 0.5 mg BID (starting 8/30). Plan to further taper to 0.5 mg qHS > dc -- PRN Phenergan  ## Medical Decision Making Capacity:  Formal decision making capacity was not assessed for any particular decision as part of routine psychiatric evaluation   ## Further Work-up:  -- Recommend TSH, lipid panel  -- Most recent EKG on 01/23/22 had QtC of 456 -- Antipsychotic monitoring: A1c 5.0 (01/22/2022)  -- Pertinent labwork reviewed earlier this admission includes:  UDS + amphetamines, bezos, cocaine, THC CK 488> 2727 > 3535 > 2527 > 2744 > 4,112  B12 WNL Hep A IgM, Hep B Sah, Hep B core IgM, HCV Ab, HIV - all NR  ## Disposition:  -- Recommend inpatient psych when medically stable  ## Behavioral / Environmental:  -- COWS per protocol -- Delirium precautions -- Suicide precautions  ##Legal Status -- Voluntary   Thank you for this consult request. Recommendations have been communicated to the primary team. We will continue to follow at this time  Princess Bruins, DO  History  Relevant Aspects of Hospital Course:  Admitted on 01/21/2022 for AMS (altered mental status) [R41.82].  ONE: Removed IVs, reported to RN about feeling a "manic episode" coming up this AM  Patient Report:  No family present at bedside.   Patient was seen this AM sitting up in hospital bed. Patient just finished another shower this AM. Per RN, patient has had 6 showers since last night to help relieve the N/V. Since increasing seroquel, patient has not had vomiting. Still experiences abdominal cramping and nausea. Patient pulled out IV and declined sch meds land IVF last night, because of perceived ineffectiveness of the medication. Informed patient on indication of the other sch meds and importance of IVF. Especially because of patient's CK. Informed patient of possible etiologies of his elevated CK and why IVF is important to prevent kidney injury. Patient  was amenable to having IV team replace his IV access.   Patient endorsed passive SI 01/26/2022, where he would be ok if he were not to wake up the next day. Endorsed having active SI 01/24/2022 with plan to strangle self with phone cord. Patient did not because his brothers were present with him at the time. Patient was unable to identify what has changed since his last active SI and present time. Stated that his pain feels unbearable and is tired of it. Unable to identify what would stop him from hurting himself today if he were alone.   Patient stated that over the past 6 months (since breakup with girlfriend), patient has had active SI with plan to either hang self, jump off building, or shoot self. Stated that he did not attempt, because he was living with his brother. Identified reason for living was his 9yo daughter. Denied past suicide attempt.  Patient denied owning or easy access to guns.   Discussed with patient suicide precautions and psych hospitalization once medically stable. Patient was amenable to plan and had not other questions or concerns.   ROS:  Mood: "tired" Sleep: poor Appetite: decreased due to nausea and abdominal cramps   Endorsed passive SI per above.  Denied HI/AVH. Denied paranoia. Did not appear internally pre-occupied.   Collateral information:  Brothers were at bedside and confirmed HPI.   Psychiatric History:  Information collected from chart review, family and  patient Dx: Scz, bipolar d/o, bipolar affective disorder, cannabis use disorder, tobacco use d/o Prior suicide attempt: denied Inpatient psych: yes multi Outpatient psych: denied  Family psych history: Deferred  Social History:  Cannabis: Yes Tobacco use: He smokes a pack every 2 days.  Alcohol use: Rarely, does not remember last time Drug use: He also takes 30 mg of percocet 3-4 times a day for some time. This is not prescribed to him. Also possible benzo misuse.   Family History:  The  patient's family history includes Colon cancer in his maternal uncle; Diabetes in his maternal aunt; Heart disease in his paternal grandfather; Liver cancer in his mother; Testicular cancer in his brother.  Medical History: Past Medical History:  Diagnosis Date   Bipolar 1 disorder (HCC)    Bipolar disorder (HCC)    Cyclic vomiting syndrome    GERD (gastroesophageal reflux disease)    Nephrolithiasis    Schizophrenia, acute (HCC)    Surgical History: Past Surgical History:  Procedure Laterality Date   APPENDECTOMY     unremarkable     Medications:   Current Facility-Administered Medications:    0.9 %  sodium chloride infusion, , Intravenous, PRN, Lorin Glass, MD, Last Rate: 10 mL/hr at 01/26/22 0202, New Bag at 01/26/22 0202   0.9 %  sodium chloride infusion, , Intravenous, Continuous, Marinda Elk, MD   alum & mag hydroxide-simeth (MAALOX/MYLANTA) 200-200-20 MG/5ML suspension 30 mL, 30 mL, Oral, Q4H PRN, Marinda Elk, MD, 30 mL at 01/26/22 1250   baclofen (LIORESAL) tablet 5 mg, 5 mg, Oral, TID PRN, Marinda Elk, MD, 5 mg at 01/26/22 1250   bisacodyl (DULCOLAX) suppository 10 mg, 10 mg, Rectal, Daily PRN, Lorin Glass, MD   capsaicin (ZOSTRIX) 0.025 % cream, , Topical, BID, Melene Plan, DO, Given at 01/25/22 2152   Chlorhexidine Gluconate Cloth 2 % PADS 6 each, 6 each, Topical, Q0600, Leslye Peer, MD, 6 each at 01/24/22 1819   [START ON 01/27/2022] clonazePAM (KLONOPIN) tablet 0.5 mg, 0.5 mg, Oral, BID, Princess Bruins, DO   clonazePAM Scarlette Calico) tablet 1 mg, 1 mg, Oral, BID, Princess Bruins, DO   cyclobenzaprine (FLEXERIL) tablet 5 mg, 5 mg, Oral, TID, Lorin Glass, MD, 5 mg at 01/26/22 1537   docusate sodium (COLACE) capsule 100 mg, 100 mg, Oral, BID PRN, Gleason, Darcella Gasman, PA-C   enoxaparin (LOVENOX) injection 40 mg, 40 mg, Subcutaneous, Q24H, Lorin Glass, MD, 40 mg at 01/25/22 7371   feeding supplement (ENSURE ENLIVE / ENSURE PLUS) liquid 237  mL, 237 mL, Oral, TID BM, Marinda Elk, MD   haloperidol lactate (HALDOL) injection 5 mg, 5 mg, Intravenous, Q6H PRN, Marinda Elk, MD, 5 mg at 01/26/22 1635   hydrALAZINE (APRESOLINE) injection 5 mg, 5 mg, Intravenous, Q4H PRN, Lorin Glass, MD, 5 mg at 01/22/22 1955   morphine (PF) 2 MG/ML injection 4 mg, 4 mg, Intravenous, Q4H PRN, Marinda Elk, MD, 4 mg at 01/26/22 1715   ondansetron (ZOFRAN) injection 4 mg, 4 mg, Intravenous, Q6H PRN, Gleason, Darcella Gasman, PA-C, 4 mg at 01/25/22 1356   Oral care mouth rinse, 15 mL, Mouth Rinse, PRN, Lorin Glass, MD   pantoprazole (PROTONIX) injection 40 mg, 40 mg, Intravenous, Q12H, Marinda Elk, MD, 40 mg at 01/25/22 2152   polyethylene glycol (MIRALAX / GLYCOLAX) packet 17 g, 17 g, Oral, Daily PRN, Gleason, Darcella Gasman, PA-C, 17 g at 01/23/22 0926   polyethylene glycol (MIRALAX /  GLYCOLAX) packet 17 g, 17 g, Oral, Daily, Marinda Elk, MD, 17 g at 01/25/22 0916   promethazine (PHENERGAN) 12.5 mg in sodium chloride 0.9 % 50 mL IVPB, 12.5 mg, Intravenous, Q6H PRN, Lorin Glass, MD, Last Rate: 200 mL/hr at 01/26/22 0203, 12.5 mg at 01/26/22 0203   QUEtiapine (SEROQUEL) tablet 50 mg, 50 mg, Oral, Q8H, Princess Bruins, DO, 50 mg at 01/26/22 1306  Allergies: Allergies  Allergen Reactions   Gluten Meal Nausea And Vomiting    Stomach pains   Haldol [Haloperidol]     Makes me anxious    Milk-Related Compounds Nausea And Vomiting    Stomach upset   Latex Rash     Objective  Vital signs:  BMI Body mass index is 18.12 kg/m. Temp:  [98.9 F (37.2 C)-99.3 F (37.4 C)] 98.9 F (37.2 C) (08/29 0835) Pulse Rate:  [49-128] 54 (08/29 0835) Resp:  [20] 20 (08/29 0835) BP: (135-178)/(78-92) 135/90 (08/29 0835) SpO2:  [82 %-100 %] 100 % (08/29 0835)  Psychiatric Specialty Exam: General Appearance: Appropriate for Environment; Casual; Fairly Groomed   Eye Contact: Fair   Speech: Clear and Coherent; Normal Rate    Volume: Normal    Mood: -- ("tired")  Affect: Appropriate; Congruent; Restricted    Thought Process: Coherent; Goal Directed; Linear  Descriptions of Associations: Intact   Orientation: Full (Time, Place and Person)   Thought Content: Rumination; Illogical; Other (comment) (tired of n/v and wants to get better)  Hallucinations: None   Ideas of Reference: None   Suicidal Thoughts: Yes, Passive (last active SI was 8/27 with plan to strangle self with phone cord)  Homicidal Thoughts: No   Memory: Immediate Fair    Judgement: Fair  Insight: Shallow    Psychomotor Activity: Normal (flaccit tone. No cogwheeling)    Concentration: Good  Attention Span: Good  Recall: Fair    Fund of Knowledge: Fair    Language: Good    Handed: Right    Assets: Communication Skills; Desire for Improvement; Housing; Social Support    Sleep: Poor (took 6 showers last night to relieve N/V)    AIMS:   ,  ,  ,  ,  ,   CIWA:  COWS:     Physical Exam: Physical Exam Vitals and nursing note reviewed.  Constitutional:      Appearance: He is not ill-appearing.  HENT:     Head: Normocephalic.  Pulmonary:     Effort: Pulmonary effort is normal. No respiratory distress.  Skin:    General: Skin is warm and dry.  Neurological:     Mental Status: He is alert.  Psychiatric:        Behavior: Behavior is cooperative.     Signed: Princess Bruins, DO Psychiatry Resident, PGY-2 Psychiatry Consult Service [facility] 01/26/2022, 5:24 PM

## 2022-01-26 NOTE — Progress Notes (Addendum)
10:30 Spoke with psychiatry regarding possible transfer to inpatient psych. Due to Patient requiring IV for hydration for rising CK levels- the patient is not appropriate for inpatient psych.   Spoke w/ Dr. Robb Matar regarding need to for IV access. Writer attempted PIV placement x2. Notified IV team who placed a peripheral IV via ultrasound guide.   1545- Patient removed IV. Spoke with Dr. Robb Matar via secure chat and via phone regarding inability to maintain IV access. MD expressed need for IV hydration to prevent patient deterioration- Clinical research associate agrees. Suggested midline placement, MD disagreed. Stated the need for peripheral IV was a "nursing concern and to stop attempting to place responsibility on me" Contacted IV team for placement of another PIV for access for IV hydration, IV team refused to place IV.   Unit director present and privy of secure chat conversation with MD and verbal conversation with IV team member  1630- Floor RN started access. Peripheral IV reinforced and covered to prevent removal. 1:1 suicide sitter at bedside- made aware of importance of IV access. Writer adamantly explained the importance of maintaining IV access to the patient to prevent deterioration. Patient verbalizes understanding

## 2022-01-26 NOTE — Progress Notes (Signed)
[facility] PROGRESS NOTE    Progress Note  Erik Cowan  TKP:[id number] DOB: 09/04/1985 DOA: 01/21/2022 PCP: Patient, No Pcp Per     Brief Narrative:   Erik Cowan is an 36 y.o. male past medical history of cyclic vomiting syndrome, cannabis use, type I bipolar disorder and possibly schizophrenia who is brought in by EMS for abdominal pain nausea vomiting and confusion, the patient related he had passed a kidney stone was started on IV droperidol Dilaudid and Versed then became more agitated requiring ketamine CT scan of the abdomen and pelvis was done showed no evidence of stone or acute abnormalities.  Had a mild rise in his creatinine, with a white count of 20,000, and a CK of 500.  Given his encephalopathy he was transferred to the ICU and started on Precedex    Significant Events: 01/22/2022 admitted to the ICU service on Precedex  Assessment/Plan:   Acute confusional state/psychosis/incident acute metabolic encephalopathy: With a baseline of schizophrenia and bipolar disorder Home meds were stopped. Started on Seroquel, Klonopin was increased. UDS was positive for amphetamines benzodiazepines cocaine and marijuana He was also given a trial of Flexeril. Psych was consulted and they will reevaluate for inpatient psych admission on 01/26/2022 Discontinue clonidine, use Haldol IV as needed for agitation.  Acute kidney injury/rhabdomyolysis: Resolved with fluid resuscitation. His CK is trending up we will give him 2 L of normal saline then continue aggressive IV fluid hydration recheck CK tomorrow morning.  Constipation: Continue MiraLAX.  Abdominal pain possible gastritis/hyperemesis cannabis syndrome/chronic marijuana use: Patient continues to have nausea and vomiting complaining his abdominal pain is worse. With epigastric tenderness on physical exam question due to ongoing nausea and vomiting continue Zofran. Re-check lipase, CT of the abdomen and pelvis on  admission did not show acute pancreatitis, continue aggressive IV fluid hydration. Check a CBC with differential. Check a basic metabolic panel and a CBC. If no improvement over the next 48 hours, will have to consult GI for possible EGD. Continue Protonix IV twice daily  Junctional bradycardia: In a 36 year old currently asymptomatic.     DVT prophylaxis: lovenox Family Communication:none Status is: Inpatient Remains inpatient appropriate because: Acute agitation and aggression likely due to medication use    Code Status:     Code Status Orders  (From admission, onward)           Start     Ordered   01/22/22 0521  Full code  Continuous        01/22/22 0520           Code Status History     Date Active Date Inactive Code Status Order ID Comments User Context   01/21/2021 2156 01/26/2021 2303 Full Code [id number]  Anselm Jungling, DO ED   03/18/2015 1836 03/20/2015 1542 Full Code [id number]  Pricilla Loveless, MD ED   05/23/2012 0205 05/25/2012 1912 Full Code [id number]  Melodie Bouillon, RN Inpatient   06/04/2011 1810 06/07/2011 1804 Full Code [id number]  Skipper Cliche, RN Inpatient         IV Access:   Peripheral IV   Procedures and diagnostic studies:   No results found.   Medical Consultants:   None.   Subjective:    Erik Cowan complaining of abdominal pain mainly epigastric, he relates he has not had a bowel movement  Objective:    Vitals:   01/25/22 1945 01/25/22 2332 01/26/22 0055 01/26/22 0835  BP:   (!) 178/92 (!) 135/90  Pulse:   Marland Kitchen)  49 (!) 54  Resp:   20 20  Temp: 99.2 F (37.3 C) 98.9 F (37.2 C) 99.3 F (37.4 C) 98.9 F (37.2 C)  TempSrc: Oral Oral Oral Oral  SpO2:   100% 100%  Weight:      Height:       SpO2: 100 %   Intake/Output Summary (Last 24 hours) at 01/26/2022 1012 Last data filed at 01/26/2022 0830 Gross per 24 hour  Intake 1311.94 ml  Output 200 ml  Net 1111.94 ml    Filed Weights   01/22/22 0357  01/23/22 0500  Weight: 73.9 kg 64 kg    Exam: General exam: In no acute distress. Respiratory system: Good air movement and clear to auscultation. Cardiovascular system: S1 & S2 heard, RRR. No JVD. Gastrointestinal system: Positive bowel sounds soft epigastric tenderness no rebound or guarding positive bowel movements Extremities: No pedal edema. Skin: No rashes, lesions or ulcers Psychiatry: Judgement and insight appear normal. Mood & affect appropriate.   Data Reviewed:    Labs: Basic Metabolic Panel: Recent Labs  Lab 01/21/22 2125 01/22/22 0534 01/22/22 0748 01/23/22 0050 01/24/22 0031  NA 137  --  131* 135 136  K 3.5  --  4.7 3.9 3.9  CL 98  --  102 104 106  CO2 20*  --  22 22 25   GLUCOSE 145*  --  118* 107* 104*  BUN 27*  --  26* 22* 23*  CREATININE 1.95* 1.26* 1.04 0.97 0.95  CALCIUM 11.2*  --  9.4 9.2 8.9  MG 1.8  --   --  1.8 2.1  PHOS  --   --   --  3.4  --     GFR Estimated Creatinine Clearance: 97.3 mL/min (by C-G formula based on SCr of 0.95 mg/dL). Liver Function Tests: Recent Labs  Lab 01/21/22 2125  AST 47*  ALT 29  ALKPHOS 82  BILITOT 1.0  PROT 9.2*  ALBUMIN 5.4*    Recent Labs  Lab 01/21/22 2125  LIPASE 21    Recent Labs  Lab 01/22/22 0748  AMMONIA 28    Coagulation profile No results for input(s): "INR", "PROTIME" in the last 168 hours. COVID-19 Labs  No results for input(s): "DDIMER", "FERRITIN", "LDH", "CRP" in the last 72 hours.  Lab Results  Component Value Date   SARSCOV2NAA NEGATIVE 01/21/2021    CBC: Recent Labs  Lab 01/21/22 2125 01/22/22 0534 01/22/22 0748 01/23/22 0050  WBC 20.0* 17.2* 18.3* 14.0*  NEUTROABS 17.4*  --   --   --   HGB 14.8 12.4* 13.0 11.7*  HCT 41.6 33.2* 35.1* 31.4*  MCV 95.0 92.7 92.4 92.4  PLT 187 163 145* 127*    Cardiac Enzymes: Recent Labs  Lab 01/22/22 0748 01/23/22 0050 01/24/22 0031 01/25/22 0710 01/26/22 0041  CKTOTAL 2,727* 3,535* 2,527* 2,744* 4,112*    BNP  (last 3 results) No results for input(s): "PROBNP" in the last 8760 hours. CBG: Recent Labs  Lab 01/23/22 1924 01/23/22 2007 01/23/22 2317 01/24/22 0306 01/25/22 0828  GLUCAP 92 105* 123* 156* 126*    D-Dimer: No results for input(s): "DDIMER" in the last 72 hours. Hgb A1c: No results for input(s): "HGBA1C" in the last 72 hours.  Lipid Profile: No results for input(s): "CHOL", "HDL", "LDLCALC", "TRIG", "CHOLHDL", "LDLDIRECT" in the last 72 hours. Thyroid function studies: No results for input(s): "TSH", "T4TOTAL", "T3FREE", "THYROIDAB" in the last 72 hours.  Invalid input(s): "FREET3" Anemia work up: No results for input(s): "VITAMINB12", "FOLATE", "  FERRITIN", "TIBC", "IRON", "RETICCTPCT" in the last 72 hours. Sepsis Labs: Recent Labs  Lab 01/21/22 2125 01/22/22 0534 01/22/22 0748 01/23/22 0050  WBC 20.0* 17.2* 18.3* 14.0*    Microbiology Recent Results (from the past 240 hour(s))  MRSA Next Gen by PCR, Nasal     Status: None   Collection Time: 01/22/22  6:11 AM   Specimen: Nasal Mucosa; Nasal Swab  Result Value Ref Range Status   MRSA by PCR Next Gen NOT DETECTED NOT DETECTED Final    Comment: (NOTE) The GeneXpert MRSA Assay (FDA approved for NASAL specimens only), is one component of a comprehensive MRSA colonization surveillance program. It is not intended to diagnose MRSA infection nor to guide or monitor treatment for MRSA infections. Test performance is not FDA approved in patients less than 29 years old. Performed at [facility], [address], [place], [place] [address]      Medications:    capsaicin   Topical BID   Chlorhexidine Gluconate Cloth  6 each Topical Q0600   clonazePAM  1 mg Oral BID   cyclobenzaprine  5 mg Oral TID   enoxaparin (LOVENOX) injection  40 mg Subcutaneous Q24H   feeding supplement  237 mL Oral TID BM   pantoprazole (PROTONIX) IV  40 mg Intravenous Q12H   polyethylene glycol  17 g Oral Daily   QUEtiapine  50 mg  Oral Q8H   Continuous Infusions:  sodium chloride 10 mL/hr at 01/26/22 0202   promethazine (PHENERGAN) injection (IM or IVPB) 12.5 mg (01/26/22 0203)      LOS: 4 days   Marinda Elk  [facility]  01/26/2022, 10:12 AM

## 2022-01-26 NOTE — Treatment Plan (Addendum)
IV team order placed to replace IV. IV team stated and verified that the patient has had multiple IV's placed daily since admission. Patient continues to remove IV access. Per IV team another IV can not be placed at this time due to the vein preservation policy so that the patient does not run out of veins. Also, the patient has removed at least 2 IV's in the last 24 hours. On call MD notified. Will discuss with treatment team for medication route changes.   With multiple doses of Ativan and sedatives, patient's impulsiveness, restlessness, bradycardia and no IV access patient would benefit from a more appropriate level of care. Discussed with Water quality scientist. Will continue to monitor.

## 2022-01-26 NOTE — Progress Notes (Signed)
Patient agree and understand the importance of having and IV and getting lab drawn. He undertsand he is in rhabdo and this will affect his kidney function which will cause his  K to rise. He has agree on having his lab drawn and getting an IV for fluids and meds.

## 2022-01-27 DIAGNOSIS — F1910 Other psychoactive substance abuse, uncomplicated: Secondary | ICD-10-CM

## 2022-01-27 LAB — CBC WITH DIFFERENTIAL/PLATELET
Abs Immature Granulocytes: 0.01 10*3/uL (ref 0.00–0.07)
Basophils Absolute: 0 10*3/uL (ref 0.0–0.1)
Basophils Relative: 0 %
Eosinophils Absolute: 0.1 10*3/uL (ref 0.0–0.5)
Eosinophils Relative: 1 %
HCT: 29.6 % — ABNORMAL LOW (ref 39.0–52.0)
Hemoglobin: 11 g/dL — ABNORMAL LOW (ref 13.0–17.0)
Immature Granulocytes: 0 %
Lymphocytes Relative: 33 %
Lymphs Abs: 2 10*3/uL (ref 0.7–4.0)
MCH: 34.4 pg — ABNORMAL HIGH (ref 26.0–34.0)
MCHC: 37.2 g/dL — ABNORMAL HIGH (ref 30.0–36.0)
MCV: 92.5 fL (ref 80.0–100.0)
Monocytes Absolute: 0.8 10*3/uL (ref 0.1–1.0)
Monocytes Relative: 12 %
Neutro Abs: 3.2 10*3/uL (ref 1.7–7.7)
Neutrophils Relative %: 54 %
Platelets: 143 10*3/uL — ABNORMAL LOW (ref 150–400)
RBC: 3.2 MIL/uL — ABNORMAL LOW (ref 4.22–5.81)
RDW: 10.9 % — ABNORMAL LOW (ref 11.5–15.5)
WBC: 6.1 10*3/uL (ref 4.0–10.5)
nRBC: 0 % (ref 0.0–0.2)

## 2022-01-27 LAB — BASIC METABOLIC PANEL
Anion gap: 6 (ref 5–15)
BUN: 11 mg/dL (ref 6–20)
CO2: 26 mmol/L (ref 22–32)
Calcium: 8.9 mg/dL (ref 8.9–10.3)
Chloride: 102 mmol/L (ref 98–111)
Creatinine, Ser: 0.91 mg/dL (ref 0.61–1.24)
GFR, Estimated: >60 mL/min (ref >60)
Glucose, Bld: 97 mg/dL (ref 70–99)
Potassium: 3.6 mmol/L (ref 3.5–5.1)
Sodium: 134 mmol/L — ABNORMAL LOW (ref 135–145)

## 2022-01-27 LAB — LIPASE, BLOOD: Lipase: 39 U/L (ref 11–51)

## 2022-01-27 LAB — LIPID PANEL
Cholesterol: 116 mg/dL (ref 0–200)
HDL: 60 mg/dL (ref >40)
LDL Cholesterol: 50 mg/dL (ref 0–99)
Total CHOL/HDL Ratio: 1.9 RATIO
Triglycerides: 31 mg/dL (ref <150)
VLDL: 6 mg/dL (ref 0–40)

## 2022-01-27 LAB — CK: Total CK: 2615 U/L — ABNORMAL HIGH (ref 49–397)

## 2022-01-27 LAB — TSH: TSH: 1.931 u[IU]/mL (ref 0.350–4.500)

## 2022-01-27 MED ORDER — HYDROMORPHONE HCL 1 MG/ML IJ SOLN
2.0000 mg | INTRAMUSCULAR | Status: DC | PRN
  Administered 2022-01-27 – 2022-01-28 (×4): 2 mg via INTRAVENOUS
  Filled 2022-01-27 (×4): qty 2

## 2022-01-27 MED ORDER — CLONAZEPAM 0.5 MG PO TABS
0.5000 mg | ORAL_TABLET | Freq: Two times a day (BID) | ORAL | Status: AC
  Administered 2022-01-27: 0.5 mg via ORAL
  Filled 2022-01-27: qty 1

## 2022-01-27 MED ORDER — CLONAZEPAM 0.25 MG PO TBDP
0.2500 mg | ORAL_TABLET | Freq: Two times a day (BID) | ORAL | Status: AC
Start: 2022-01-28 — End: 2022-01-29
  Administered 2022-01-28 – 2022-01-29 (×3): 0.25 mg via ORAL
  Filled 2022-01-27 (×3): qty 1

## 2022-01-27 MED ORDER — SODIUM CHLORIDE 0.9 % IV SOLN: INTRAVENOUS | Status: DC

## 2022-01-27 MED ORDER — METOCLOPRAMIDE HCL 5 MG/ML IJ SOLN
10.0000 mg | Freq: Three times a day (TID) | INTRAMUSCULAR | Status: DC
  Administered 2022-01-27 – 2022-01-28 (×3): 10 mg via INTRAVENOUS
  Filled 2022-01-27 (×4): qty 2

## 2022-01-27 MED ORDER — OLANZAPINE 5 MG PO TBDP
2.5000 mg | ORAL_TABLET | Freq: Three times a day (TID) | ORAL | Status: DC
  Administered 2022-01-27 – 2022-01-28 (×3): 2.5 mg via ORAL
  Filled 2022-01-27 (×5): qty 0.5

## 2022-01-27 MED ORDER — CHLORPROMAZINE HCL 25 MG PO TABS
25.0000 mg | ORAL_TABLET | Freq: Once | ORAL | Status: AC
  Administered 2022-01-27: 25 mg via ORAL
  Filled 2022-01-27: qty 1

## 2022-01-27 NOTE — Progress Notes (Signed)
[facility]  PROGRESS NOTE  Erik Erik Cowan YSA:[id number] DOB: Nov 05, 1985 DOA: 01/21/2022 PCP: Patient, No Pcp Per   Brief HPI:   36 year old male with past medical history of cyclical vomiting syndrome, cannabis use, type I bipolar disorder, possible schizophrenia who was brought in by EMS for abdominal pain, nausea, vomiting and confusion.  Per ED notes patient took PCP, became profoundly agitated, delirious also received multiple medication including benzodiazepine, Haldol, Geodon, Dilaudid also received ketamine to facilitate CT abdomen/pelvis.  P.  CT abdomen/pelvis showed no evidence of stone or other acute abnormality.  He had mild dryness creatinine with WBC 20,000, CK of 3535.  Due to his encephalopathy he was transferred to ICU and started on Precedex.  Patient was transferred out of ICU to [facility] service.  Subjective   Patient seen and examined, complains of abdominal pain.  Has been getting morphine quite regularly and says that pain not controlled with morphine.   Assessment/Plan:    Acute confusional state/psychosis/metabolic encephalopathy -Mental status has improved -Patient was started on Seroquel, Klonopin was increased -UDS positive for amphetamines, benzodiazepines, cocaine, marijuana -Psych was consulted, patient is suicidal so he will need to go to inpatient psych unit  Acute kidney injury/rhabdomyolysis -CK went up to 3535 -Patient had refused IV fluids yesterday, agreed to take IV fluids after Dr. Robb Matar discussed with patient -CK is down to 2527 -Continue IV normal saline at 125 mL/h  Abdominal pain/gastritis/hyperemesis cannabis syndrome -Continues to have nausea and vomiting -1 dose of Thorazine given this morning for hiccups -CT abdomen/pelvis did not show acute abnormality -Continue Protonix 40 mg IV every 12 hours -We will start Reglan 10 mg IV every 8 hours scheduled  Suicidal ideation -Psych following -Patient is high risk for suicide and  will be transferred to inpatient psych once medically stable    Medications     capsaicin   Topical BID   Chlorhexidine Gluconate Cloth  6 each Topical Q0600   [START ON 01/28/2022] clonazePAM  0.25 mg Oral BID   clonazePAM  0.5 mg Oral BID   cyclobenzaprine  5 mg Oral TID   enoxaparin (LOVENOX) injection  40 mg Subcutaneous Q24H   feeding supplement  237 mL Oral TID BM   OLANZapine zydis  2.5 mg Oral Q8H   pantoprazole (PROTONIX) IV  40 mg Intravenous Q12H     Data Reviewed:   CBG:  Recent Labs  Lab 01/23/22 1924 01/23/22 2007 01/23/22 2317 01/24/22 0306 01/25/22 0828  GLUCAP 92 105* 123* 156* 126*    SpO2: 100 %    Vitals:   01/26/22 0835 01/26/22 1949 01/27/22 0600 01/27/22 0800  BP: (!) 135/90 (!) 147/91  (!) 156/90  Pulse: (!) 54 (!) 53  (!) 56  Resp: 20 17  20   Temp: 98.9 F (37.2 C) 99 F (37.2 C)  98.4 F (36.9 C)  TempSrc: Oral Oral  Oral  SpO2: 100% 100%  100%  Weight:   62 kg   Height:          Data Reviewed:  Basic Metabolic Panel: Recent Labs  Lab 01/21/22 2125 01/22/22 0534 01/22/22 0748 01/23/22 0050 01/24/22 0031 01/27/22 0040  NA 137  --  131* 135 136 134*  K 3.5  --  4.7 3.9 3.9 3.6  CL 98  --  102 104 106 102  CO2 20*  --  22 22 25 26   GLUCOSE 145*  --  118* 107* 104* 97  BUN 27*  --  26* 22*  23* 11  CREATININE 1.95* 1.26* 1.04 0.97 0.95 0.91  CALCIUM 11.2*  --  9.4 9.2 8.9 8.9  MG 1.8  --   --  1.8 2.1  --   PHOS  --   --   --  3.4  --   --     CBC: Recent Labs  Lab 01/21/22 2125 01/22/22 0534 01/22/22 0748 01/23/22 0050 01/27/22 0040  WBC 20.0* 17.2* 18.3* 14.0* 6.1  NEUTROABS 17.4*  --   --   --  3.2  HGB 14.8 12.4* 13.0 11.7* 11.0*  HCT 41.6 33.2* 35.1* 31.4* 29.6*  MCV 95.0 92.7 92.4 92.4 92.5  PLT 187 163 145* 127* 143*    LFT Recent Labs  Lab 01/21/22 2125  AST 47*  ALT 29  ALKPHOS 82  BILITOT 1.0  PROT 9.2*  ALBUMIN 5.4*     Antibiotics: Anti-infectives (From admission, onward)    None         DVT prophylaxis: Lovenox  Code Status: Full code  Family Communication: No family at bedside   CONSULTS    Objective    Physical Examination:  General-appears in no acute distress Heart-S1-S2, regular, no murmur auscultated Lungs-clear to auscultation bilaterally, no wheezing or crackles auscultated Abdomen-soft, nontender, no organomegaly Extremities-no edema in the lower extremities Neuro-alert, oriented x3, no focal deficit noted   Status is: Inpatient:             Monicia Tse S Iyahna Obriant   [facility] If 7PM-7AM, please contact night-coverage at [email or web address], Office  [phone]   01/27/2022, 3:50 PM  LOS: 5 days

## 2022-01-27 NOTE — Consult Note (Signed)
[facility] Psychiatry New Face-to-Face Psychiatric Evaluation  Date of Service: January 27, 2022 Name: Erik Cowan DOB: 1985-06-16 MRN: [id number] Reason for Consult: "Psychosis" Requesting Provider: Marinda Elk, MD  Assessment  Erik Cowan is a 36 y.o. male admitted medically for 01/21/2022  8:39 PM for AMS in the setting of fall. He carries the psychiatric diagnoses of bipolar affective d/o, opiate use d/o (percocet unprescribed), cannabis use d/o w cannabis hyperemesis, tobacco use d/o, past SI (2016). Presented to [facility] (01/21/2022) after being found down, covered in vomitus after falling in shower. Also had abdominal pain with N/V. Found to have rhabdo with CK 4000, IVF responsive.   Active suicidal ideation In the setting of persistent unbearable N/V/abdominal pain, medication non-adherence due to financial stressors, recent job loss (6 months prior to admission), and recent breakup with girlfriend (2 weeks prior admission). Endorsed plan to either hang self, shoot self, jump off building. Patient did not attempt due to living with brother and sense of responsibility to 9yo daughter.  Protective factors: responsibility to 9yo daughter, positive familial support, living with family. Patient denied access to guns, further safety planning with collateral required. Patient is high risk of suicide and will be transferred inpatient psych hospitalization once medically stable. 8/29 patient was amenable to psych hospitalization - voluntary, but will IVC if patient tries to leave AMA.  Rhabdomyolysis, improving CK ~4000s, initially concerned for NMS due to inc seroquel last night. However, afebrile and on exam, no rigidity or cogwheeling on exam so will continue seroquel for now. CK downtrend after 1L IVF to ~2000s. Goal is down-trending CK without IVF.  Cannabis hyperemesis, improving Long standing hx, since 2016. Trialed haldol which helped in the past, however patient  declined due to side effects of akathisia. Increased home seroquel BID to TID, with partial effectiveness. Switched for seroquel to zyprexa zydis due to partial effectiveness (still experiencing severe nausea with some vomiting, but still requiring multiple hot showers and PRNS).   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 Home meds included ativan and seroquel 200 mg daily, which patient ran out of in May because he no longer is employed. He reports h/o bipolar d/o and szc with sxs onset at 36years old. Unclear if patient has bipolar d/o/scz vs Substance-induced psychosis in the past. Patient's first psych hospitalization was 2016. Reported sxs of "acting manic, throwing shit, breaking things, kicking stuff" decreased need for sleep (about 5 hours of sleep total in the week leading up to this), evaluation was interupted by patient's N/V. Plan to re-evaluate again 8/29. Tapering patient off of klonopin due to no active prescriber and patient has not been on it in months.   Please see plan below for detailed recommendations.   Diagnoses:  Active Hospital problems: Principal Problem:   AMS (altered mental status) Active Problems:   Bipolar 1 disorder (HCC)   Acute renal failure (HCC)   Malnutrition of moderate degree   Polysubstance abuse (HCC)   Plan  ## Safety and Observation Level:  - Based on my clinical evaluation, I estimate the patient to be at high risk of self harm in the current setting - At this time, we recommend a 1:1 level of observation. This decision is based on my review of the chart including patient's history and current presentation, interview of the patient, mental status examination, and consideration of suicide  risk including evaluating suicidal ideation, plan, intent, suicidal or self-harm behaviors, risk factors,  and protective factors. This judgment is based on our ability to directly address suicide risk, implement suicide prevention strategies and develop a safety plan while the patient is in the clinical setting. Please contact our team if there is a concern that risk level has changed.  ## Medications:  -- DISCONTINUED seroquel 50 mg TID  -- STARTED zyprexa 2.5 mg TID -- TAPERED klonopin 0.5 mg BID (last dose 8/30 PM) to 0.25 mg BID (last dose 9/1 AM)  -- Flexeril 5 mg TID per primary -- PRN Phenergan per primary  ## Medical Decision Making Capacity:  Formal decision making capacity was not assessed for any particular decision as part of routine psychiatric evaluation   ## Further Work-up:  -- F/u TSH, lipid panel, CK, BMP  -- Most recent EKG on 01/26/22 had QtC of 393 -- Antipsychotic monitoring: A1c 5.0 (01/22/2022)  -- Pertinent labwork reviewed earlier this admission includes:  UDS + amphetamines, bezos, cocaine, THC Tylenol level <10, BAL <10 Hep A IgM, Hep B Sah, Hep B core IgM, HCV Ab, HIV - all NR Phos and Mg wnl (8/26), B12 WNL CK 488 > 2727 > 3535 > 2527 > 2744 > 4112 > 2615 Lipase 39  ## Disposition:  -- Recommend inpatient psych when medically stable  ## Behavioral / Environmental:  -- COWS per protocol -- Delirium precautions -- Suicide precautions  ##Legal Status -- Voluntary - Please IVC if patient tries to leave AMA  Thank you for this consult request. Recommendations have been communicated to the primary team. We will continue to follow at this time  Princess Bruins, DO  History  Relevant Aspects of Hospital Course:  Admitted on 01/21/2022 for AMS (altered mental status) [R41.82].  ONE: No vomiting, showers x5.   Patient Report:  No family present at bedside. Sitter present.   Patient    Thought Content: Rumination, Illogical, Other (comment) (tired of n/v and wants to get better)  ROS:  Mood:  ("tired") Sleep: Poor (took 6 showers last night to relieve  N/V) Appetite: intact, but unable to tolerate PO Hallucinations: None  Ideas of Reference: None  Suicidal Thoughts: Yes, Passive (last active SI was 8/27 with plan to strangle self with phone cord) Homicidal Thoughts: No    Psychiatric History:  Information collected from chart review, family and patient Dx: Scz, bipolar d/o, bipolar affective disorder, cannabis use disorder, tobacco use d/o Prior suicide attempt: denied. SI (2016) Inpatient psych: yes [facility] 2016) Outpatient psych: denied  Family psych history: Deferred   Social History:  Cannabis: Yes Tobacco use: He smokes a pack every 2 days.  Alcohol use: Rarely, does not remember last time Drug use: He also takes 30 mg of percocet 3-4 times a day for some time. This is not prescribed to him. Also possible benzo misuse.   Family History:  The patient's family history includes Colon cancer in his maternal uncle; Diabetes in his maternal aunt; Heart disease in his paternal grandfather; Liver cancer in his mother; Testicular cancer in his brother.  Medical History: Past Medical History:  Diagnosis Date   Bipolar 1 disorder (HCC)    Bipolar disorder (HCC)    Cyclic vomiting syndrome    GERD (gastroesophageal reflux disease)    Nephrolithiasis    Schizophrenia, acute (HCC)    Surgical History: Past Surgical History:  Procedure Laterality Date   APPENDECTOMY     unremarkable  Medications:   Current Facility-Administered Medications:    0.9 %  sodium chloride infusion, , Intravenous, PRN, Lorin Glass, MD, Last Rate: 10 mL/hr at 01/26/22 0202, New Bag at 01/26/22 0202   0.9 %  sodium chloride infusion, , Intravenous, Continuous, Sharl Ma, Sarina Ill, MD   alum & mag hydroxide-simeth (MAALOX/MYLANTA) 200-200-20 MG/5ML suspension 30 mL, 30 mL, Oral, Q4H PRN, Marinda Elk, MD, 30 mL at 01/27/22 0513   baclofen (LIORESAL) tablet 5 mg, 5 mg, Oral, TID PRN, Marinda Elk, MD, 5 mg at 01/27/22 7564   bisacodyl  (DULCOLAX) suppository 10 mg, 10 mg, Rectal, Daily PRN, Lorin Glass, MD   capsaicin (ZOSTRIX) 0.025 % cream, , Topical, BID, Melene Plan, DO, Given at 01/27/22 3329   Chlorhexidine Gluconate Cloth 2 % PADS 6 each, 6 each, Topical, Q0600, Leslye Peer, MD, 6 each at 01/24/22 1819   [START ON 01/28/2022] clonazePAM (KLONOPIN) disintegrating tablet 0.25 mg, 0.25 mg, Oral, BID, Princess Bruins, DO   clonazePAM Scarlette Calico) tablet 0.5 mg, 0.5 mg, Oral, BID, Princess Bruins, DO   cyclobenzaprine (FLEXERIL) tablet 5 mg, 5 mg, Oral, TID, Lorin Glass, MD, 5 mg at 01/27/22 5188   docusate sodium (COLACE) capsule 100 mg, 100 mg, Oral, BID PRN, Gleason, Darcella Gasman, PA-C   enoxaparin (LOVENOX) injection 40 mg, 40 mg, Subcutaneous, Q24H, Lorin Glass, MD, 40 mg at 01/27/22 0813   feeding supplement (ENSURE ENLIVE / ENSURE PLUS) liquid 237 mL, 237 mL, Oral, TID BM, Marinda Elk, MD, 237 mL at 01/26/22 2221   haloperidol lactate (HALDOL) injection 5 mg, 5 mg, Intravenous, Q6H PRN, Marinda Elk, MD, 5 mg at 01/26/22 1635   hydrALAZINE (APRESOLINE) injection 5 mg, 5 mg, Intravenous, Q4H PRN, Lorin Glass, MD, 5 mg at 01/22/22 1955   morphine (PF) 2 MG/ML injection 4 mg, 4 mg, Intravenous, Q4H PRN, Marinda Elk, MD, 4 mg at 01/27/22 0812   ondansetron [facility]) injection 4 mg, 4 mg, Intravenous, Q6H PRN, Gleason, Darcella Gasman, PA-C, 4 mg at 01/25/22 1356   Oral care mouth rinse, 15 mL, Mouth Rinse, PRN, Lorin Glass, MD   pantoprazole (PROTONIX) injection 40 mg, 40 mg, Intravenous, Q12H, Marinda Elk, MD, 40 mg at 01/27/22 0855   polyethylene glycol (MIRALAX / GLYCOLAX) packet 17 g, 17 g, Oral, Daily PRN, Gleason, Darcella Gasman, PA-C, 17 g at 01/23/22 0926   polyethylene glycol (MIRALAX / GLYCOLAX) packet 17 g, 17 g, Oral, Daily, Marinda Elk, MD, 17 g at 01/25/22 0916   promethazine (PHENERGAN) 12.5 mg in sodium chloride 0.9 % 50 mL IVPB, 12.5 mg, Intravenous, Q6H PRN, Lorin Glass, MD, Last Rate: 200 mL/hr at 01/26/22 0203, 12.5 mg at 01/26/22 0203   QUEtiapine (SEROQUEL) tablet 50 mg, 50 mg, Oral, Q8H, Princess Bruins, DO, 50 mg at 01/27/22 4166  Allergies: Allergies  Allergen Reactions   Gluten Meal Nausea And Vomiting    Stomach pains   Haldol [Haloperidol]     Makes me anxious    Milk-Related Compounds Nausea And Vomiting    Stomach upset   Latex Rash     Objective  Vital signs:  BMI Body mass index is 17.55 kg/m. Temp:  [98.4 F (36.9 C)-99 F (37.2 C)] 98.4 F (36.9 C) (08/30 0800) Pulse Rate:  [53-56] 56 (08/30 0800) Resp:  [17-20] 20 (08/30 0800) BP: (147-156)/(90-91) 156/90 (08/30 0800) SpO2:  [100 %] 100 % (08/30 0800) Weight:  [62 kg] 62  kg (08/30 0600)  Psychiatric Specialty Exam: General Appearance: Appropriate for Environment; Casual; Fairly Groomed   Eye Contact: Fair   Speech: Clear and Coherent; Normal Rate   Volume: Normal    Mood: -- ("tired")  Affect: Appropriate; Congruent; Restricted    Thought Process: Coherent; Goal Directed; Linear  Descriptions of Associations: Intact   Orientation: Full (Time, Place and Person)   Thought Content: Rumination; Illogical; Other (comment) (tired of n/v and wants to get better)  Hallucinations: None   Ideas of Reference: None   Suicidal Thoughts: Yes, Passive (last active SI was 8/27 with plan to strangle self with phone cord)  Homicidal Thoughts: No   Memory: Immediate Fair    Judgement: Fair  Insight: Shallow    Psychomotor Activity: Normal (flaccit tone. No cogwheeling)    Concentration: Good  Attention Span: Good  Recall: Fair    Fund of Knowledge: Fair    Language: Good    Handed: Right    Assets: Communication Skills; Desire for Improvement; Housing; Social Support    Sleep: Poor   AIMS:   ,  ,  ,  ,  ,   CIWA:  COWS:     Physical Exam: Physical Exam Vitals and nursing note reviewed.  Constitutional:       Appearance: He is not ill-appearing.  HENT:     Head: Normocephalic.  Pulmonary:     Effort: Pulmonary effort is normal. No respiratory distress.  Skin:    General: Skin is warm and dry.  Neurological:     Mental Status: He is alert.  Psychiatric:        Behavior: Behavior is cooperative.     Signed: Princess Bruins, DO Psychiatry Resident, PGY-2 Psychiatry Consult Service [facility] 01/27/2022, 9:58 AM

## 2022-01-27 NOTE — Progress Notes (Signed)
Pt frequently going to the bathroom to take a shower. 5x. Ongoing plan of care.

## 2022-01-28 LAB — BASIC METABOLIC PANEL
Anion gap: 6 (ref 5–15)
BUN: 9 mg/dL (ref 6–20)
CO2: 25 mmol/L (ref 22–32)
Calcium: 8.4 mg/dL — ABNORMAL LOW (ref 8.9–10.3)
Chloride: 102 mmol/L (ref 98–111)
Creatinine, Ser: 0.83 mg/dL (ref 0.61–1.24)
GFR, Estimated: >60 mL/min (ref >60)
Glucose, Bld: 139 mg/dL — ABNORMAL HIGH (ref 70–99)
Potassium: 3.2 mmol/L — ABNORMAL LOW (ref 3.5–5.1)
Sodium: 133 mmol/L — ABNORMAL LOW (ref 135–145)

## 2022-01-28 LAB — CK: Total CK: 969 U/L — ABNORMAL HIGH (ref 49–397)

## 2022-01-28 MED ORDER — POTASSIUM CHLORIDE CRYS ER 20 MEQ PO TBCR
40.0000 meq | EXTENDED_RELEASE_TABLET | Freq: Once | ORAL | Status: AC
  Administered 2022-01-28: 40 meq via ORAL
  Filled 2022-01-28: qty 2

## 2022-01-28 MED ORDER — HYDROMORPHONE HCL 2 MG PO TABS
2.0000 mg | ORAL_TABLET | ORAL | Status: DC | PRN
  Administered 2022-01-28: 2 mg via ORAL
  Filled 2022-01-28: qty 1

## 2022-01-28 MED ORDER — METOCLOPRAMIDE HCL 5 MG PO TABS
10.0000 mg | ORAL_TABLET | Freq: Three times a day (TID) | ORAL | Status: DC
  Administered 2022-01-28 – 2022-01-29 (×4): 10 mg via ORAL
  Filled 2022-01-28 (×4): qty 2

## 2022-01-28 MED ORDER — HYDROMORPHONE HCL 1 MG/ML IJ SOLN
2.0000 mg | INTRAMUSCULAR | Status: DC | PRN
  Administered 2022-01-28 – 2022-01-29 (×5): 2 mg via INTRAVENOUS
  Filled 2022-01-28 (×5): qty 2

## 2022-01-28 MED ORDER — OLANZAPINE 5 MG PO TBDP
7.5000 mg | ORAL_TABLET | Freq: Every day | ORAL | Status: DC
  Filled 2022-01-28: qty 1.5

## 2022-01-28 MED ORDER — METOCLOPRAMIDE HCL 5 MG PO TABS
10.0000 mg | ORAL_TABLET | Freq: Four times a day (QID) | ORAL | Status: DC | PRN
Start: 2022-01-28 — End: 2022-01-28

## 2022-01-28 MED ORDER — AMLODIPINE BESYLATE 5 MG PO TABS
5.0000 mg | ORAL_TABLET | Freq: Every day | ORAL | Status: DC
  Administered 2022-01-28 – 2022-01-29 (×2): 5 mg via ORAL
  Filled 2022-01-28 (×2): qty 1

## 2022-01-28 MED ORDER — HYDRALAZINE HCL 25 MG PO TABS: 25.0000 mg | ORAL_TABLET | Freq: Four times a day (QID) | ORAL | Status: DC | PRN

## 2022-01-28 NOTE — Consult Note (Signed)
[facility] Psychiatry New Face-to-Face Psychiatric Evaluation  Date of Service: January 28, 2022 Name: Erik Cowan DOB: 06/26/1985 MRN: [id number] Reason for Consult: "Psychosis" Requesting Provider: Marinda Elk, MD  Assessment  Erik Cowan is a 36 y.o. male admitted medically for 01/21/2022  8:39 PM for AMS in the setting of fall. He carries the psychiatric diagnoses of bipolar affective d/o, opiate use d/o (percocet unprescribed), cannabis use d/o w cannabis hyperemesis, tobacco use d/o, past SI (2016). Presented to [facility] (01/21/2022) after being found down, covered in vomitus after falling in shower. Also had abdominal pain with N/V. Found to have rhabdo with CK 4000, IVF responsive.   Opiate use d/o Benzodiazepine use d/o Cannabis use d/o UDS + amphetamines, benzodiazepine, cocaine, THC on admission.  Reported using percocets twice a week for the past month d/t abdominal pain from persistent N/V, nightly xanax unprescribed for sleep, cannabis at least twice daily for anxiety. Patient maintained remission from substances until 6 months ago due to job loss. Patient is motivated to quit substances and go to residential. [facility] has requested CSW or case manager to reach out to them about dispo so they can complete their intake process for substance use d/o rehab. CSW has been contacted, dispo goal 9/1 to [facility]  Cannabis hyperemesis, improving Long standing hx, since 2016. Trialed haldol which helped in the past, however patient declined due to side effects of akathisia. Increased home seroquel BID to TID, with partial effectiveness. Switched for seroquel to zyprexa zydis due to partial effectiveness (still experiencing severe nausea with some vomiting, but still requiring multiple hot showers and PRNS). Improved with zyprexa and reglan (per primary team). Consolidated zyprexa to PM. Patient on zyprexa zydis, please switch to capsule form at dc due to high  cost of zydis.  Suicidal ideation, improved In the setting of persistent unbearable N/V/abdominal pain, medication non-adherence due to financial stressors, recent job loss (6 months prior to admission), and recent breakup with girlfriend (2 weeks prior admission). Endorsed plan to either hang self, shoot self, jump off building. Patient did not attempt due to living with brother and sense of responsibility to 9yo daughter.  8/29 patient was amenable to psych hospitalization - voluntary, but will IVC if patient tries to leave AMA. Active SI was 2/2 uncontrolled abdominal pain, N/V which led to poor sleep over past few nights.  On 8/31 eval, patient no longer requires inpatient psych hospitalization as patient is no longer actively suicidal due to management of N/V/abdominal pain and resulting insomnia which were the inciting factors to his SI. Patient was able to sleep last night due to no N/V and abdominal pain since night prior.  Protective factors: responsibility to 9yo daughter, positive familial support, living with brother, help seeking, future oriented.  Safety planning: denied access or guns or weapons. Contracted to safety, would call brothers. Informed patient about 988, [facility], and 911.   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 Home meds included ativan and seroquel 200 mg daily, which patient ran out of in May because he no longer is employed. He reports h/o bipolar d/o and szc with sxs onset at 36years old. Unclear if patient has bipolar d/o/scz vs Substance-induced psychosis in the past. Patient's first psych hospitalization was 2016. Reported sxs of "acting manic, throwing shit, breaking things, kicking stuff" decreased need for sleep (about  5 hours of sleep total in the week leading up to this), evaluation was interupted by  patient's N/V. Plan to re-evaluate again 8/29. Tapering patient off of klonopin due to no active prescriber and patient has not been on it in months.   Rhabdomyolysis, RESOLVED CK ~4000s, initially concerned for NMS due to inc seroquel last night. However, afebrile and on exam, no rigidity or cogwheeling on exam so will continue seroquel for now. CK downtrend after 1L IVF to ~2000s. Continued to improve off of IVF overnight.   Please see plan below for detailed recommendations.   Diagnoses:  Active Hospital problems: Principal Problem:   AMS (altered mental status) Active Problems:   Bipolar 1 disorder (HCC)   Acute renal failure (HCC)   Malnutrition of moderate degree   Polysubstance abuse (HCC)   Plan  ## Safety and Observation Level:  - Based on my clinical evaluation, I estimate the patient to be at moderate risk of self harm in the current setting - At this time, we recommend a 1:1 level of observation. This decision is based on my review of the chart including patient's history and current presentation, interview of the patient, mental status examination, and consideration of suicide risk including evaluating suicidal ideation, plan, intent, suicidal or self-harm behaviors, risk factors, and protective factors. This judgment is based on our ability to directly address suicide risk, implement suicide prevention strategies and develop a safety plan while the patient is in the clinical setting. Please contact our team if there is a concern that risk level has changed.  ## Medications:  -- DISCONTINUED seroquel 50 mg TID  -- CHANGED zyprexa 2.5 mg TID to Zyprexa zydis 7.5 mg qHS -- TAPERED klonopin 0.5 mg BID (last dose 8/30 PM) to 0.25 mg BID (last dose 9/1 AM)  -- Flexeril 5 mg TID per primary -- PRN Phenergan, reglan per primary  ## Medical Decision Making Capacity:  Formal decision making capacity was not assessed for any particular decision as part of routine psychiatric  evaluation   ## Further Work-up:  -- Per primary  -- EKG on 01/26/22 had QtC of 393. QTc 409 (8/31) -- Antipsychotic monitoring: A1c 5.0 (01/22/2022), lipid panel wnl.   -- Pertinent labwork reviewed earlier this admission includes:  TSH wnl UDS + amphetamines, bezos, cocaine, THC Tylenol level <10, BAL <10 Hep A IgM, Hep B Sah, Hep B core IgM, HCV Ab, HIV - all NR Phos and Mg wnl (8/26), B12 WNL CK 488 > 2727 > 3535 > 2527 > 2744 > 4112 > 2615 > 969 (wo IVF) Lipase 39  ## Disposition:  -- [facility] residential rehab, 01/28/22  ## Behavioral / Environmental:  -- COWS per protocol -- Delirium precautions -- Suicide precautions  ##Legal Status -- Voluntary - Please IVC if patient tries to leave AMA  Thank you for this consult request. Recommendations have been communicated to the primary team. We will continue to follow at this time  Princess Bruins, DO  History  Relevant Aspects of Hospital Course:  Admitted on 01/21/2022 for AMS (altered mental status) [R41.82].  ONE: No vomiting for 2days, tolerated vegetable soup last night.   Patient Report:  No family present at bedside. Sitter present.   Reported having best sleep last night, as over past few days, he was unable to sleep due to N/V. He attributed improvement to dilaudid. He requested dilaudid, but was understanding of discontinuing it for rehab. Continued to be amenable to rehab. Informed team that [facility] have  his information and needed CSW or case management to contact them for dispo timeline.  Patient denied active and passive SI today. Stated change was because of improvement in N and abdominal pain with no more vomiting allowing him to sleep at night. Stated that prior to improvement, his pain was unbearable. Despite pain, patient remained hopeful and help seeking. With needing to live for daughter. Discussed with patient that he currently no longer meets requirement to psych hospital due to change in modifiable risk  factors. Next step is substance use treatment, which would be better managed at rehab.  Continued to have "voices", that are not command in nature and are mood congruent. Stated that the "voices" tell him he's not where he needs to be and that [facility]   Patient was amenable to plan and had no other concerns.    ROS:  Mood:  ("tired, but better") Sleep: Fair Appetite: intact Hallucinations: None ("voices" that tell him to go to rehab.Are mood congruent, not consistent with psychotic process.)  Ideas of Reference: None  Suicidal Thoughts: No (Last passive SI was 8/30 AM, last active SI 8/29.) Homicidal Thoughts: No    Psychiatric History:  Information collected from chart review, family and patient Dx: Scz, bipolar d/o, bipolar affective disorder, cannabis use disorder, tobacco use d/o Prior suicide attempt: denied. SI (2016) Inpatient psych: yes [facility]) Outpatient psych: denied  Family psych history: Deferred   Social History:  Cannabis: Yes Tobacco use: He smokes a pack every 2 days.  Alcohol use: Rarely, does not remember last time Drug use: He also takes 30 mg of percocet 3-4 times a day for some time. This is not prescribed to him. Also possible benzo misuse.   Family History:  The patient's family history includes Colon cancer in his maternal uncle; Diabetes in his maternal aunt; Heart disease in his paternal grandfather; Liver cancer in his mother; Testicular cancer in his brother.  Medical History: Past Medical History:  Diagnosis Date   Bipolar 1 disorder (HCC)    Bipolar disorder (HCC)    Cyclic vomiting syndrome    GERD (gastroesophageal reflux disease)    Nephrolithiasis    Schizophrenia, acute (HCC)    Surgical History: Past Surgical History:  Procedure Laterality Date   APPENDECTOMY     unremarkable     Medications:   Current Facility-Administered Medications:    0.9 %  sodium chloride infusion, , Intravenous, PRN, Lorin GlassSmith, Daniel  C, MD, Last Rate: 10 mL/hr at 01/26/22 0202, New Bag at 01/26/22 0202   alum & mag hydroxide-simeth (MAALOX/MYLANTA) 200-200-20 MG/5ML suspension 30 mL, 30 mL, Oral, Q4H PRN, Marinda ElkFeliz Ortiz, Abraham, MD, 30 mL at 01/28/22 1247   amLODipine (NORVASC) tablet 5 mg, 5 mg, Oral, Daily, Sharl MaLama, Gagan S, MD, 5 mg at 01/28/22 0959   baclofen (LIORESAL) tablet 5 mg, 5 mg, Oral, TID PRN, Marinda ElkFeliz Ortiz, Abraham, MD, 5 mg at 01/28/22 1247   bisacodyl (DULCOLAX) suppository 10 mg, 10 mg, Rectal, Daily PRN, Lorin GlassSmith, Daniel C, MD   capsaicin (ZOSTRIX) 0.025 % cream, , Topical, BID, Melene PlanFloyd, Dan, DO, Given at 01/28/22 [id number]   Chlorhexidine Gluconate Cloth 2 % PADS 6 each, 6 each, Topical, Q0600, Leslye PeerByrum, Robert S, MD, 6 each at 01/28/22 [id number]   clonazePAM (KLONOPIN) disintegrating tablet 0.25 mg, 0.25 mg, Oral, BID, Princess BruinsNguyen, Miyani Cronic, DO, 0.25 mg at 01/28/22 [id number]   cyclobenzaprine (FLEXERIL) tablet 5 mg, 5 mg, Oral, TID, Lorin GlassSmith, Daniel C, MD, 5 mg at 01/28/22  7124   docusate sodium (COLACE) capsule 100 mg, 100 mg, Oral, BID PRN, Gleason, Darcella Gasman, PA-C   enoxaparin (LOVENOX) injection 40 mg, 40 mg, Subcutaneous, Q24H, Lorin Glass, MD, 40 mg at 01/28/22 5809   feeding supplement (ENSURE ENLIVE / ENSURE PLUS) liquid 237 mL, 237 mL, Oral, TID BM, Marinda Elk, MD, 237 mL at 01/26/22 2221   haloperidol lactate (HALDOL) injection 5 mg, 5 mg, Intravenous, Q6H PRN, Marinda Elk, MD, 5 mg at 01/26/22 1635   hydrALAZINE (APRESOLINE) tablet 25 mg, 25 mg, Oral, Q6H PRN, Sharl Ma, Sarina Ill, MD   HYDROmorphone (DILAUDID) injection 2 mg, 2 mg, Intravenous, Q4H PRN, Sharl Ma, Sarina Ill, MD, 2 mg at 01/28/22 1340   metoCLOPramide (REGLAN) tablet 10 mg, 10 mg, Oral, TID AC & HS, Sharl Ma, Sarina Ill, MD   [START ON 01/29/2022] OLANZapine zydis (ZYPREXA) disintegrating tablet 7.5 mg, 7.5 mg, Oral, QHS, Princess Bruins, DO   ondansetron [facility]) injection 4 mg, 4 mg, Intravenous, Q6H PRN, Gleason, Darcella Gasman, PA-C, 4 mg at 01/25/22 1356   Oral care mouth  rinse, 15 mL, Mouth Rinse, PRN, Lorin Glass, MD   pantoprazole (PROTONIX) injection 40 mg, 40 mg, Intravenous, Q12H, Marinda Elk, MD, 40 mg at 01/28/22 0838   polyethylene glycol (MIRALAX / GLYCOLAX) packet 17 g, 17 g, Oral, Daily PRN, Gleason, Darcella Gasman, PA-C, 17 g at 01/23/22 0926   promethazine (PHENERGAN) 12.5 mg in sodium chloride 0.9 % 50 mL IVPB, 12.5 mg, Intravenous, Q6H PRN, Lorin Glass, MD, Last Rate: 200 mL/hr at 01/26/22 0203, 12.5 mg at 01/26/22 0203  Allergies: Allergies  Allergen Reactions   Gluten Meal Nausea And Vomiting    Stomach pains   Haldol [Haloperidol]     Makes me anxious    Milk-Related Compounds Nausea And Vomiting    Stomach upset   Latex Rash     Objective  Vital signs:  BMI Body mass index is 17.55 kg/m. Temp:  [97.4 F (36.3 C)-98.5 F (36.9 C)] 98.5 F (36.9 C) (08/31 0743) Pulse Rate:  [52-57] 55 (08/31 0743) Resp:  [14-18] 18 (08/31 0743) BP: (131-174)/(81-96) 174/95 (08/31 0743) SpO2:  [100 %] 100 % (08/31 0743) Weight:  [62 kg] 62 kg (08/31 0500)  Psychiatric Specialty Exam: General Appearance: Fairly Groomed (On floor, hunched over due to abdominal pain)   Eye Contact: Fair   Speech: Clear and Coherent; Normal Rate (Spontaneous)   Volume: Decreased    Mood: -- ("tired, but better")  Affect: Appropriate; Congruent; Restricted    Thought Process: Coherent; Goal Directed; Linear  Descriptions of Associations: Intact   Orientation: Full (Time, Place and Person)   Thought Content: Rumination; Logical (Negative self talk, distracted by abdominal pain/N/V, help seeking (told us about [facility] intake, amenable to rehab or psych hospital), future oriented - living for daughter. Hopeful. "Voices" telling him to go to rehab)  Hallucinations: None ("voices" that tell him to go to rehab.Are mood congruent, not consistent with psychotic process.)   Ideas of Reference: None   Suicidal Thoughts: No (Last passive  SI was 8/30 AM, last active SI 8/29.)  Homicidal Thoughts: No   Memory: Immediate Good    Judgement: Fair  Insight: Shallow    Psychomotor Activity: Normal (AIMS 0)    Concentration: Good  Attention Span: Good  Recall: Good    Fund of Knowledge: Good    Language: Good    Handed: Right    Assets: Communication Skills; Desire for Improvement; Housing;  Resilience; Social Support    Sleep: Poor   AIMS:   ,  ,  ,  ,  ,   CIWA:  COWS:     Physical Exam: Physical Exam Vitals and nursing note reviewed.  Constitutional:      Appearance: He is not ill-appearing.  HENT:     Head: Normocephalic.  Pulmonary:     Effort: Pulmonary effort is normal. No respiratory distress.  Skin:    General: Skin is warm and dry.  Neurological:     Mental Status: He is alert.  Psychiatric:        Behavior: Behavior is cooperative.     Signed: Princess Bruins, DO Psychiatry Resident, PGY-2 Psychiatry Consult Service [facility] 01/28/2022, 2:28 PM

## 2022-01-28 NOTE — Progress Notes (Signed)
CSW left voicemail for Marcelino Duster at [facility] in [facility] requesting a return call to discuss patient.  Edwin Dada, MSW, LCSW Transitions of Care  Clinical Social Worker II [phone]

## 2022-01-28 NOTE — Progress Notes (Signed)
Patient finally ate lunch

## 2022-01-28 NOTE — Progress Notes (Signed)
Patient ate sandwich.

## 2022-01-28 NOTE — Progress Notes (Signed)
[facility]  PROGRESS NOTE  Tavares Levinson KNL:[id number] DOB: 1985-12-07 DOA: 01/21/2022 PCP: Patient, No Pcp Per   Brief HPI:   36 year old male with past medical history of cyclical vomiting syndrome, cannabis use, type I bipolar disorder, possible schizophrenia who was brought in by EMS for abdominal pain, nausea, vomiting and confusion.  Per ED notes patient took PCP, became profoundly agitated, delirious also received multiple medication including benzodiazepine, Haldol, Geodon, Dilaudid also received ketamine to facilitate CT abdomen/pelvis.  P.  CT abdomen/pelvis showed no evidence of stone or other acute abnormality.  He had mild dryness creatinine with WBC 20,000, CK of 3535.  Due to his encephalopathy he was transferred to ICU and started on Precedex.  Patient was transferred out of ICU to [facility] service.  Subjective   Patient states that nausea vomiting has improved with Reglan.   Assessment/Plan:    Acute confusional state/psychosis/metabolic encephalopathy -Mental status has improved -Patient was started on Seroquel, Klonopin was increased -UDS positive for amphetamines, benzodiazepines, cocaine, marijuana -Psych was consulted, patient is suicidal so he will need to go to inpatient psych unit   Acute kidney injury/rhabdomyolysis -CK went up to 3535 -Patient had refused IV fluids yesterday, agreed to take IV fluids after Dr. Robb Matar discussed with patient -CK is down to 969 -We will discontinue IV fluids  Abdominal pain/gastritis/hyperemesis cannabis syndrome -Nausea and vomiting improved after starting Reglan; continue Reglan 10 mg p.o. 4 times a day -1 dose of Thorazine given for hiccups -CT abdomen/pelvis did not show acute abnormality -Continue Protonix 40 mg IV every 12 hours  Hypokalemia -Potassium is 3.2 -We will give 1 dose of K-Dur 40 mEq p.o. x1  Hypertension -Blood pressure has been elevated -*Amlodipine 5 mg daily -Change IV hydralazine to  p.o.; 25 mg p.o. every 6 hours as needed for BP more than 160/100   Suicidal ideation -Psych following -Patient is high risk for suicide and will be transferred to inpatient psych once medically stable    Medications     amLODipine  5 mg Oral Daily   capsaicin   Topical BID   Chlorhexidine Gluconate Cloth  6 each Topical Q0600   clonazePAM  0.25 mg Oral BID   cyclobenzaprine  5 mg Oral TID   enoxaparin (LOVENOX) injection  40 mg Subcutaneous Q24H   feeding supplement  237 mL Oral TID BM   [START ON 01/29/2022] OLANZapine zydis  7.5 mg Oral QHS   pantoprazole (PROTONIX) IV  40 mg Intravenous Q12H     Data Reviewed:   CBG:  Recent Labs  Lab 01/23/22 1924 01/23/22 2007 01/23/22 2317 01/24/22 0306 01/25/22 0828  GLUCAP 92 105* 123* 156* 126*    SpO2: 100 %    Vitals:   01/27/22 2116 01/28/22 0421 01/28/22 0500 01/28/22 0743  BP: 131/84 134/81  (!) 174/95  Pulse: (!) 57 (!) 53  (!) 55  Resp: 16 14  18   Temp: 98.1 F (36.7 C) (!) 97.4 F (36.3 C)  98.5 F (36.9 C)  TempSrc: Oral Oral  Oral  SpO2: 100% 100%  100%  Weight:   62 kg   Height:          Data Reviewed:  Basic Metabolic Panel: Recent Labs  Lab 01/21/22 2125 01/22/22 0534 01/22/22 0748 01/23/22 0050 01/24/22 0031 01/27/22 0040 01/28/22 0349  NA 137  --  131* 135 136 134* 133*  K 3.5  --  4.7 3.9 3.9 3.6 3.2*  CL 98  --  102  104 106 102 102  CO2 20*  --  22 22 25 26 25   GLUCOSE 145*  --  118* 107* 104* 97 139*  BUN 27*  --  26* 22* 23* 11 9  CREATININE 1.95*   < > 1.04 0.97 0.95 0.91 0.83  CALCIUM 11.2*  --  9.4 9.2 8.9 8.9 8.4*  MG 1.8  --   --  1.8 2.1  --   --   PHOS  --   --   --  3.4  --   --   --    < > = values in this interval not displayed.    CBC: Recent Labs  Lab 01/21/22 2125 01/22/22 0534 01/22/22 0748 01/23/22 0050 01/27/22 0040  WBC 20.0* 17.2* 18.3* 14.0* 6.1  NEUTROABS 17.4*  --   --   --  3.2  HGB 14.8 12.4* 13.0 11.7* 11.0*  HCT 41.6 33.2* 35.1* 31.4* 29.6*   MCV 95.0 92.7 92.4 92.4 92.5  PLT 187 163 145* 127* 143*    LFT Recent Labs  Lab 01/21/22 2125  AST 47*  ALT 29  ALKPHOS 82  BILITOT 1.0  PROT 9.2*  ALBUMIN 5.4*     Antibiotics: Anti-infectives (From admission, onward)    None        DVT prophylaxis: Lovenox  Code Status: Full code  Family Communication: No family at bedside   CONSULTS    Objective    Physical Examination:  General-appears in no acute distress Heart-S1-S2, regular, no murmur auscultated Lungs-clear to auscultation bilaterally, no wheezing or crackles auscultated Abdomen-soft, nontender, no organomegaly Extremities-no edema in the lower extremities Neuro-alert, oriented x3, no focal deficit noted  Status is: Inpatient:           Revin Corker S Kinlie Janice   [facility] If 7PM-7AM, please contact night-coverage at [email or web address], Office  [phone]   01/28/2022, 12:43 PM  LOS: 6 days

## 2022-01-29 ENCOUNTER — Other Ambulatory Visit: Payer: Self-pay

## 2022-01-29 ENCOUNTER — Emergency Department (HOSPITAL_COMMUNITY)
Admission: EM | Admit: 2022-01-29 | Discharge: 2022-01-30 | Disposition: A | Discharge status: Home / Self Care | Payer: Self-pay | Attending: Emergency Medicine | Admitting: Emergency Medicine

## 2022-01-29 ENCOUNTER — Encounter (HOSPITAL_COMMUNITY): Payer: Self-pay | Admitting: Emergency Medicine

## 2022-01-29 DIAGNOSIS — R112 Nausea with vomiting, unspecified: Secondary | ICD-10-CM | POA: Insufficient documentation

## 2022-01-29 DIAGNOSIS — R109 Unspecified abdominal pain: Secondary | ICD-10-CM

## 2022-01-29 DIAGNOSIS — F17210 Nicotine dependence, cigarettes, uncomplicated: Secondary | ICD-10-CM | POA: Insufficient documentation

## 2022-01-29 DIAGNOSIS — R1084 Generalized abdominal pain: Secondary | ICD-10-CM | POA: Insufficient documentation

## 2022-01-29 LAB — BASIC METABOLIC PANEL
Anion gap: 9 (ref 5–15)
BUN: 10 mg/dL (ref 6–20)
CO2: 27 mmol/L (ref 22–32)
Calcium: 9 mg/dL (ref 8.9–10.3)
Chloride: 95 mmol/L — ABNORMAL LOW (ref 98–111)
Creatinine, Ser: 0.88 mg/dL (ref 0.61–1.24)
GFR, Estimated: >60 mL/min (ref >60)
Glucose, Bld: 99 mg/dL (ref 70–99)
Potassium: 3.7 mmol/L (ref 3.5–5.1)
Sodium: 131 mmol/L — ABNORMAL LOW (ref 135–145)

## 2022-01-29 MED ORDER — OXYCODONE-ACETAMINOPHEN 5-325 MG PO TABS
1.0000 | ORAL_TABLET | ORAL | Status: DC | PRN
  Administered 2022-01-29: 1 via ORAL
  Filled 2022-01-29: qty 1

## 2022-01-29 MED ORDER — PANTOPRAZOLE SODIUM 40 MG PO TBEC: 40.0000 mg | DELAYED_RELEASE_TABLET | Freq: Two times a day (BID) | ORAL | 0 refills | Status: DC

## 2022-01-29 MED ORDER — OLANZAPINE 15 MG PO TBDP: 7.5000 mg | ORAL_TABLET | Freq: Every day | ORAL | 2 refills | Status: DC

## 2022-01-29 MED ORDER — OXYCODONE-ACETAMINOPHEN 5-325 MG PO TABS: 1.0000 | ORAL_TABLET | Freq: Three times a day (TID) | ORAL | 0 refills | Status: DC | PRN

## 2022-01-29 MED ORDER — METOCLOPRAMIDE HCL 10 MG PO TABS: 10.0000 mg | ORAL_TABLET | Freq: Three times a day (TID) | ORAL | 1 refills | Status: DC

## 2022-01-29 MED ORDER — DOCUSATE SODIUM 100 MG PO CAPS: 100.0000 mg | ORAL_CAPSULE | Freq: Two times a day (BID) | ORAL | 0 refills | Status: DC | PRN

## 2022-01-29 MED ORDER — CYCLOBENZAPRINE HCL 5 MG PO TABS: 5.0000 mg | ORAL_TABLET | Freq: Three times a day (TID) | ORAL | 0 refills | Status: DC

## 2022-01-29 MED ORDER — CHLORPROMAZINE HCL 25 MG PO TABS
25.0000 mg | ORAL_TABLET | Freq: Once | ORAL | Status: AC
  Administered 2022-01-29: 25 mg via ORAL
  Filled 2022-01-29: qty 1

## 2022-01-29 MED ORDER — AMLODIPINE BESYLATE 5 MG PO TABS: 5.0000 mg | ORAL_TABLET | Freq: Every day | ORAL | 3 refills | Status: DC

## 2022-01-29 MED ORDER — ONDANSETRON 4 MG PO TBDP
4.0000 mg | ORAL_TABLET | Freq: Once | ORAL | Status: AC | PRN
  Administered 2022-01-29: 4 mg via ORAL
  Filled 2022-01-29: qty 1

## 2022-01-29 MED ORDER — BACLOFEN 5 MG PO TABS: 5.0000 mg | ORAL_TABLET | Freq: Three times a day (TID) | ORAL | 0 refills | Status: DC | PRN

## 2022-01-29 MED ORDER — PANTOPRAZOLE SODIUM 40 MG PO TBEC: 40.0000 mg | DELAYED_RELEASE_TABLET | Freq: Two times a day (BID) | ORAL | Status: DC

## 2022-01-29 NOTE — Progress Notes (Signed)
[facility]  PROGRESS NOTE  Erik Cowan NAT:[id number] DOB: 05-19-1986 DOA: 01/21/2022 PCP: Patient, No Pcp Per   Brief HPI:   36 year old male with past medical history of cyclical vomiting syndrome, cannabis use, type I bipolar disorder, possible schizophrenia who was brought in by EMS for abdominal pain, nausea, vomiting and confusion.  Per ED notes patient took PCP, became profoundly agitated, delirious also received multiple medication including benzodiazepine, Haldol, Geodon, Dilaudid also received ketamine to facilitate CT abdomen/pelvis.  P.  CT abdomen/pelvis showed no evidence of stone or other acute abnormality.  He had mild dryness creatinine with WBC 20,000, CK of 3535.  Due to his encephalopathy he was transferred to ICU and started on Precedex.  Patient was transferred out of ICU to [facility] service.  Subjective   Patient seen and examined, nausea vomiting has improved.  Still has hiccups.  Says that IV Dilaudid is also not working now.  He was taking Percocet for pain off the street as outpatient.   Assessment/Plan:    Acute confusional state/psychosis/metabolic encephalopathy -Mental status has improved -Patient was started on Seroquel, Klonopin was increased -UDS positive for amphetamines, benzodiazepines, cocaine, marijuana -Psych was consulted, patient is no longer suicidal  -Psych recommends inpatient rehab, patient to go to [facility] on Tuesday.   Acute kidney injury/rhabdomyolysis -CK went up to 3535 -Started on aggressive IV hydration -CK is down to 969 -IV fluids were discontinued  Abdominal pain/gastritis/hyperemesis cannabis syndrome -Nausea and vomiting improved after starting Reglan; continue Reglan 10 mg p.o. 4 times a day -1 dose of Thorazine given for hiccups -CT abdomen/pelvis did not show acute abnormality -Continue Protonix 40 mg I p.o. twice daily  Hypokalemia -Replete  Hypertension -Blood pressure stable -Continue amlodipine 5 mg  daily -Change IV hydralazine to p.o.; 25 mg p.o. every 6 hours as needed for BP more than 160/100   Chronic pain syndrome -We will switch IV Dilaudid to Percocet 5/325 1 to 2 tablets every 4 hours as needed for pain  Suicidal ideation -Psych following -Patient is high risk for suicide and will be transferred to inpatient psych once medically stable    Medications     amLODipine  5 mg Oral Daily   capsaicin   Topical BID   Chlorhexidine Gluconate Cloth  6 each Topical Q0600   cyclobenzaprine  5 mg Oral TID   enoxaparin (LOVENOX) injection  40 mg Subcutaneous Q24H   feeding supplement  237 mL Oral TID BM   metoCLOPramide  10 mg Oral TID AC & HS   OLANZapine zydis  7.5 mg Oral QHS   pantoprazole  40 mg Oral BID     Data Reviewed:   CBG:  Recent Labs  Lab 01/23/22 1924 01/23/22 2007 01/23/22 2317 01/24/22 0306 01/25/22 0828  GLUCAP 92 105* 123* 156* 126*    SpO2: 100 %    Vitals:   01/28/22 0500 01/28/22 0743 01/29/22 0200 01/29/22 0717  BP:  (!) 174/95 (!) 164/88 120/82  Pulse:  (!) 55 84 64  Resp:  18 18 16   Temp:  98.5 F (36.9 C) 99.6 F (37.6 C) 97.8 F (36.6 C)  TempSrc:  Oral Oral Oral  SpO2:  100% 99% 100%  Weight: 62 kg     Height:          Data Reviewed:  Basic Metabolic Panel: Recent Labs  Lab 01/23/22 0050 01/24/22 0031 01/27/22 0040 01/28/22 0349 01/29/22 0629  NA 135 136 134* 133* 131*  K 3.9 3.9 3.6  3.2* 3.7  CL 104 106 102 102 95*  CO2 22 25 26 25 27   GLUCOSE 107* 104* 97 139* 99  BUN 22* 23* 11 9 10   CREATININE 0.97 0.95 0.91 0.83 0.88  CALCIUM 9.2 8.9 8.9 8.4* 9.0  MG 1.8 2.1  --   --   --   PHOS 3.4  --   --   --   --     CBC: Recent Labs  Lab 01/23/22 0050 01/27/22 0040  WBC 14.0* 6.1  NEUTROABS  --  3.2  HGB 11.7* 11.0*  HCT 31.4* 29.6*  MCV 92.4 92.5  PLT 127* 143*    LFT No results for input(s): "AST", "ALT", "ALKPHOS", "BILITOT", "PROT", "ALBUMIN" in the last 168 hours.     Antibiotics: Anti-infectives (From admission, onward)    None        DVT prophylaxis: Lovenox  Code Status: Full code  Family Communication: No family at bedside   CONSULTS    Objective    Physical Examination:  General-appears in no acute distress Heart-S1-S2, regular, no murmur auscultated Lungs-clear to auscultation bilaterally, no wheezing or crackles auscultated Abdomen-soft, nontender, no organomegaly Extremities-no edema in the lower extremities Neuro-alert, oriented x3, no focal deficit noted  Status is: Inpatient:           01/25/22   [facility] If 7PM-7AM, please contact night-coverage at [email or web address], Office  [phone]   01/29/2022, 9:33 AM  LOS: 7 days

## 2022-01-29 NOTE — Discharge Instructions (Addendum)
Please arrive at [facility] on Tuesday, September 5th, 2023 at 7:45am for admission.  [facility] [address] [place], [place] [address] Phone: [phone]

## 2022-01-29 NOTE — Discharge Summary (Signed)
Physician Discharge Summary   Patient: Erik Cowan MRN: [id number] DOB: May 16, 1986  Admit date:     01/21/2022  Discharge date: 01/29/22  Discharge Physician: Meredeth Ide   PCP: Patient, No Pcp Per   Recommendations at discharge:   Patient to be discharged home Plan to go to [facility] on 02/02/2022 for inpatient substance abuse rehab program  Discharge Diagnoses: Principal Problem:   AMS (altered mental status) Active Problems:   Bipolar 1 disorder (HCC)   Acute renal failure (HCC)   Malnutrition of moderate degree   Polysubstance abuse (HCC)  Resolved Problems:   * No resolved hospital problems. *  Hospital Course:  36 year old male with past medical history of cyclical vomiting syndrome, cannabis use, type I bipolar disorder, possible schizophrenia who was brought in by EMS for abdominal pain, nausea, vomiting and confusion.  Per ED notes patient took PCP, became profoundly agitated, delirious also received multiple medication including benzodiazepine, Haldol, Geodon, Dilaudid also received ketamine to facilitate CT abdomen/pelvis.  P.  CT abdomen/pelvis showed no evidence of stone or other acute abnormality.  He had mild dryness creatinine with WBC 20,000, CK of 3535.  Due to his encephalopathy he was transferred to ICU and started on Precedex.   Patient was transferred out of ICU to [facility] service.  Assessment and Plan:  Acute confusional state/psychosis/metabolic encephalopathy -Mental status has improved; back to baseline -Patient was started on Seroquel, Klonopin was increased -UDS positive for amphetamines, benzodiazepines, cocaine, marijuana -Psych was consulted, patient is no longer suicidal ; psychiatry has cleared him for discharge -Psych recommends inpatient rehab, patient to go to [facility] on Tuesday.     Acute kidney injury/rhabdomyolysis -CK went up to 3535 -Started on aggressive IV hydration -CK is down to 969 -IV fluids were discontinued   Abdominal  pain/gastritis/hyperemesis cannabis syndrome -Nausea and vomiting improved after starting Reglan; continue Reglan 10 mg p.o. 4 times a day -1 dose of Thorazine given for hiccups -CT abdomen/pelvis did not show acute abnormality -Continue Protonix 40 mg I p.o. twice daily -Continue baclofen 5 mg 3 times daily as needed for hiccups   Hypokalemia -Replete   Hypertension -Blood pressure stable -Continue amlodipine 5 mg daily      Chronic pain syndrome -Patient has been getting IV Dilaudid in the hospital and will switch to Percocet for continued abdominal pain -Patient is supposed to go to [facility] inpatient substance abuse rehab on Tuesday -I am concerned that patient will be going to serious opioid withdrawal and will have worsening of pain, will discharge him with 15 tablets of Percocet 5/325 1 to 2 tablets every 8 hours as needed -Hopefully these tablets will last for next 2 to 3 days and patient can be tapered off opioids before going to inpatient rehab on Tuesday   Suicidal ideation -Psych was consulted -Patient cleared for discharge, not endorsing suicidal ideation anymore.   Called and discussed with patient's brother on phone.       Consultants: Psychiatry Procedures performed:  Disposition: Home Diet recommendation:  Discharge Diet Orders (From admission, onward)     Start     Ordered   01/29/22 0000  Diet - low sodium heart healthy        01/29/22 1431           Regular diet DISCHARGE MEDICATION: Allergies as of 01/29/2022       Reactions   Gluten Meal Nausea And Vomiting   Stomach pains   Haldol [haloperidol]    Makes me anxious  Milk-related Compounds Nausea And Vomiting   Stomach upset   Latex Rash        Medication List     STOP taking these medications    ondansetron 4 MG disintegrating tablet Commonly known as: Zofran ODT       TAKE these medications    amLODipine 5 MG tablet Commonly known as: NORVASC Take 1 tablet (5 mg  total) by mouth daily. Start taking on: January 30, 2022   Baclofen 5 MG Tabs Take 5 mg by mouth 3 (three) times daily as needed for muscle spasms (hiccups).   cyclobenzaprine 5 MG tablet Commonly known as: FLEXERIL Take 1 tablet (5 mg total) by mouth 3 (three) times daily.   docusate sodium 100 MG capsule Commonly known as: COLACE Take 1 capsule (100 mg total) by mouth 2 (two) times daily as needed for mild constipation.   metoCLOPramide 10 MG tablet Commonly known as: REGLAN Take 1 tablet (10 mg total) by mouth 4 (four) times daily -  before meals and at bedtime.   multivitamin with minerals Tabs tablet Take 1 tablet by mouth daily.   nicotine 14 mg/24hr patch Commonly known as: NICODERM CQ - dosed in mg/24 hours Place 1 patch (14 mg total) onto the skin daily.   OLANZapine zydis 15 MG disintegrating tablet Commonly known as: ZYPREXA Take 0.5 tablets (7.5 mg total) by mouth at bedtime.   oxyCODONE-acetaminophen 5-325 MG tablet Commonly known as: Percocet Take 1-2 tablets by mouth every 8 (eight) hours as needed for severe pain.   pantoprazole 40 MG tablet Commonly known as: PROTONIX Take 1 tablet (40 mg total) by mouth 2 (two) times daily. What changed: when to take this        Discharge Exam: Filed Weights   01/23/22 0500 01/27/22 0600 01/28/22 0500  Weight: 64 kg 62 kg 62 kg   General-appears in no acute distress Heart-S1-S2, regular, no murmur auscultated Lungs-clear to auscultation bilaterally, no wheezing or crackles auscultated Abdomen-soft, nontender, no organomegaly Extremities-no edema in the lower extremities Neuro-alert, oriented x3, no focal deficit noted  Condition at discharge: good  The results of significant diagnostics from this hospitalization (including imaging, microbiology, ancillary and laboratory) are listed below for reference.   Imaging Studies: EEG adult  Result Date: 2022/01/23 Charlsie Quest, MD     01-23-22  3:21 PM  Patient Name: Erik Cowan MRN: [id number] Epilepsy Attending: Charlsie Quest Referring Physician/Provider: Lorin Glass, MD Date: 2022-01-23 Duration: 22.44 mins Patient history: 36yo M with ams. EEG to evaluate for seizure. Level of alertness: awake, asleep AEDs during EEG study: Ativan Technical aspects: This EEG study was done with scalp electrodes positioned according to the 10-20 International system of electrode placement. Electrical activity was reviewed with band pass filter of 1-70Hz , sensitivity of 7 uV/mm, display speed of 72mm/sec with a 60Hz  notched filter applied as appropriate. EEG data were recorded continuously and digitally stored.  Video monitoring was available and reviewed as appropriate. Description: During awake state, eeg showed continuous generalized 3-4hz  theta-delta slowing admixed with 15-18hz  generalized beta activity. Sleep was characterized by vertex waves, sleep spindles (12 to 14 Hz), maximal frontocentral region. Hyperventilation and photic stimulation were not performed.   ABNORMALITY - Continuous slow, generalized - Excessive beta, generalized IMPRESSION: This study is suggestive of moderate diffuse encephalopathy, nonspecific etiology. The excessive beta activity seen in the background is most likely due to the effect of benzodiazepine and is a benign EEG pattern. No seizures or epileptiform discharges  were seen throughout the recording. Charlsie Quest   DG Abd Portable 1V  Result Date: 01/22/2022 CLINICAL DATA:  Nasogastric tube placement. EXAM: PORTABLE ABDOMEN - 1 VIEW COMPARISON:  Oct 15, 2017. FINDINGS: Distal tip of feeding tube is seen in expected position of distal stomach. IMPRESSION: Distal tip of feeding tube seen in expected position of distal stomach. Electronically Signed   By: Lupita Raider M.D.   On: 01/22/2022 11:34   CT Renal Stone Study  Result Date: 01/22/2022 CLINICAL DATA:  Back pain, nausea and vomiting. Kidney stone suspected. EXAM: CT  ABDOMEN AND PELVIS WITHOUT CONTRAST TECHNIQUE: Multidetector CT imaging of the abdomen and pelvis was performed following the standard protocol without IV contrast. RADIATION DOSE REDUCTION: This exam was performed according to the departmental dose-optimization program which includes automated exposure control, adjustment of the mA and/or kV according to patient size and/or use of iterative reconstruction technique. COMPARISON:  CT without contrast 01/21/2021 FINDINGS: Lower chest: No acute abnormality. Hepatobiliary: Limited visualization due to motion. No obvious abnormality. Unremarkable gallbladder and bile ducts. Pancreas: Obscured due to patient motion. Spleen: Normal in size and attenuation. Adrenals/Urinary Tract: There is no adrenal mass. The unenhanced renal cortex is unremarkable allowing for motion. No urinary stones or obstruction are observed. The bladder is normal. Stomach/Bowel: Not well evaluated due to very low body fat in this patient and due to motion. No obvious bowel dilatation is seen. Surgically absent appendix. Mild fecal stasis. Vascular/Lymphatic: No adenopathy is seen through the motion artifact and unopacified bowel. The unenhanced aorta is normal. Reproductive: No prostatomegaly. Other: There is no free air, free hemorrhage or free fluid. There is no incarcerated hernia. Musculoskeletal: No acute or significant osseous findings. IMPRESSION: 1. No acute noncontrast CT findings allowing for patient motion. 2. No evidence for urinary stones or obstruction. 3. Constipation. 4. Obscuration of the pancreas due to motion. Electronically Signed   By: Almira Bar M.D.   On: 01/22/2022 06:25   CT L-SPINE NO CHARGE  Result Date: 01/22/2022 CLINICAL DATA:  Back pain EXAM: CT LUMBAR SPINE WITHOUT CONTRAST TECHNIQUE: Multidetector CT imaging of the lumbar spine was performed without intravenous contrast administration. Multiplanar CT image reconstructions were also generated. RADIATION DOSE  REDUCTION: This exam was performed according to the departmental dose-optimization program which includes automated exposure control, adjustment of the mA and/or kV according to patient size and/or use of iterative reconstruction technique. COMPARISON:  None Available. FINDINGS: Segmentation: 5 lumbar type vertebrae. Alignment: Normal. Vertebrae: No acute fracture or focal pathologic process. Paraspinal and other soft tissues: Negative. Disc levels: No spinal canal stenosis. IMPRESSION: No acute fracture or static subluxation of the lumbar spine. Electronically Signed   By: Deatra Robinson M.D.   On: 01/22/2022 03:42    Microbiology: Results for orders placed or performed during the hospital encounter of 01/21/22  MRSA Next Gen by PCR, Nasal     Status: None   Collection Time: 01/22/22  6:11 AM   Specimen: Nasal Mucosa; Nasal Swab  Result Value Ref Range Status   MRSA by PCR Next Gen NOT DETECTED NOT DETECTED Final    Comment: (NOTE) The GeneXpert MRSA Assay (FDA approved for NASAL specimens only), is one component of a comprehensive MRSA colonization surveillance program. It is not intended to diagnose MRSA infection nor to guide or monitor treatment for MRSA infections. Test performance is not FDA approved in patients less than 49 years old. Performed at [facility], [address], [place],  [place] [address]     Labs: CBC: Recent Labs  Lab 01/23/22 0050 01/27/22 0040  WBC 14.0* 6.1  NEUTROABS  --  3.2  HGB 11.7* 11.0*  HCT 31.4* 29.6*  MCV 92.4 92.5  PLT 127* 143*   Basic Metabolic Panel: Recent Labs  Lab 01/23/22 0050 01/24/22 0031 01/27/22 0040 01/28/22 0349 01/29/22 0629  NA 135 136 134* 133* 131*  K 3.9 3.9 3.6 3.2* 3.7  CL 104 106 102 102 95*  CO2 22 25 26 25 27   GLUCOSE 107* 104* 97 139* 99  BUN 22* 23* 11 9 10   CREATININE 0.97 0.95 0.91 0.83 0.88  CALCIUM 9.2 8.9 8.9 8.4* 9.0  MG 1.8 2.1  --   --   --   PHOS 3.4  --   --   --   --    Liver Function  Tests: No results for input(s): "AST", "ALT", "ALKPHOS", "BILITOT", "PROT", "ALBUMIN" in the last 168 hours. CBG: Recent Labs  Lab 01/23/22 1924 01/23/22 2007 01/23/22 2317 01/24/22 0306 01/25/22 0828  GLUCAP 92 105* 123* 156* 126*    Discharge time spent: greater than 30 minutes.  Signed: 01/26/22, MD [facility] 01/29/2022

## 2022-01-29 NOTE — Plan of Care (Signed)

## 2022-01-29 NOTE — Progress Notes (Addendum)
2pm: CSW spoke with patient who states he does want to be discharged today and assured CSW that he would go to [facility] on Tuesday.  12:40pm: CSW spoke with Marcelino Duster who states the patient needs to arrive at 7:45am on Tuesday morning for admission to [facility]. All information added to patient's AVS.  8:40am: CSW spoke with Marcelino Duster at [facility] in [facility] who states the patient can admit to the facility as soon as Tuesday, 9/5. Marcelino Duster to fax CSW clinical information request and CSW will return information via fax.  CSW informed MD of information.  Edwin Dada, MSW, LCSW Transitions of Care  Clinical Social Worker II [phone]

## 2022-01-29 NOTE — ED Triage Notes (Signed)
Patient reports mid abdominal pain with multiple emesis onset this week , denies fever or chills .

## 2022-01-29 NOTE — Progress Notes (Signed)
Patient discharge summary reviewed with patient, patient verbalizes understanding. Patient to pick up discharge medications a local pharmacy listed on summary. Patient understands to report to [facility] Tuesday  Sept. 5 at 0745. Pt. Escorted by staff out hospital, brother transporting patient home in private car. No distress noted.

## 2022-01-29 NOTE — Consult Note (Addendum)
[facility] Psychiatry Followup Face-to-Face Psychiatric Evaluation   Service Date: January 29, 2022 LOS:  LOS: 7 days  Name: Erik Cowan DOB: 1985/11/10 MRN: [id number] Reason for Consult: "Psychosis" Requesting Provider: Marinda Elk, MD  Assessment  Erik Cowan is a 36 y.o. male admitted medically for 01/21/2022  8:39 PM for AMS after a Cowan. He carries the psychiatric diagnoses of bipolar affective disorder, opiate use disorder (unprescribed percocet), cannabis use disorder c/b cannabinoid-induced hyperemesis, tobacco use disorder, and past suicide attempt in 2016. Medically, patient was found to be in rhabdomyolysis with CK 4000, IVF responsive.   Opiate use d/o Benzodiazepine use d/o Cannabis use d/o UDS + amphetamines, benzodiazepine, cocaine, THC on admission.  Reported using percocets twice a week for the past month d/t abdominal pain from persistent N/V, nightly xanax unprescribed for sleep, cannabis at least twice daily for anxiety. Patient maintained remission from substances until 6 months ago due to job loss. Patient is motivated to quit substances and go to residential. [facility] has accepted patient for 9/5. In light of history of opioid use disorder, would strongly recommend patient not be prescribed opioid medications for pain management.   Cannabis hyperemesis, improving Long standing hx, since 2016. Trialed haldol which helped in the past, however patient declined due to side effects of akathisia. Increased home seroquel BID to TID, with partial effectiveness. Switched for seroquel to zyprexa zydis due to partial effectiveness (still experiencing severe nausea with some vomiting, but still requiring multiple hot showers and PRNS). Improved with zyprexa and reglan (per primary team). Consolidated zyprexa to PM.    Suicidal ideation, resolved In the setting of persistent intractable N/V/abdominal pain, medication non-adherence due to  financial stressors, recent job loss (6 months prior to admission), and recent breakup with girlfriend (2 weeks prior admission). Endorsed plan to either strangle self, shoot self, jump off building. Patient did not attempt due to living with brother and sense of responsibility to 9yo daughter.  8/29 patient was amenable to psych hospitalization - voluntary, but will IVC if patient tries to leave AMA. Active SI was 2/2 uncontrolled abdominal pain, N/V which led to poor sleep over past few nights.  On 8/31 eval, patient no longer requires inpatient psych hospitalization as patient is no longer actively suicidal due to management of N/V/abdominal pain and resulting insomnia which were the inciting factors to his SI. Patient was able to sleep last night due to no N/V and abdominal pain since night prior.  9/1 decision stands the patient no longer requires inpatient psychiatric hospitalization as he continues to deny active and passive suicidal ideation; better control of pain and improved sleep noted. Protective factors: responsibility to 9yo daughter, positive familial support, living with brother, help seeking, future oriented.  Safety planning: denied access or guns or weapons. Would call brothers if symptoms recurred. Informed patient about 988, [facility], and 911.  Patient also denied cravings for drugs (aside from opioids which he will likely be prescribed at discharge) at the time of discharge, and advised that he would not be allowed to use while at his brother's home.  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 Home meds included ativan and seroquel 200 mg daily, which patient ran out of in May because he no longer is employed. He reports h/o bipolar d/o and szc with sxs onset at 36years old. Unclear  if patient has bipolar d/o/scz vs  Substance-induced psychosis in the past. Patient's first psych hospitalization was 2016. Reported sxs of "acting manic, throwing shit, breaking things, kicking stuff" decreased need for sleep (about 5 hours of sleep total in the week leading up to this), evaluation was interupted by patient's N/V. Plan to re-evaluate again 8/29. Tapering patient off of klonopin due to no active prescriber and patient has not been on it in months; no sx of anxiety or jitters endorsed during this time period.    Rhabdomyolysis, RESOLVED CK ~4000s, initially concerned for NMS due to inc seroquel last night. However, afebrile and on exam, no rigidity or cogwheeling on exam so will continue seroquel for now. CK downtrend after 1L IVF to ~2000s. Continued to improve off of IVF overnight.   Diagnoses:  Active Hospital problems: Principal Problem:   AMS (altered mental status) Active Problems:   Bipolar 1 disorder (HCC)   Acute renal failure (HCC)   Malnutrition of moderate degree   Polysubstance abuse (HCC)     Plan  ## Safety and Observation Level:  - Based on my clinical evaluation, I estimate the patient to be at minimal risk of self harm in the current setting - At this time, we recommend a minimal level of observation, discontinuing 1:1 sitter. This decision is based on my review of the chart including patient's history and current presentation, interview of the patient, mental status examination, and consideration of suicide risk including evaluating suicidal ideation, plan, intent, suicidal or self-harm behaviors, risk factors, and protective factors. Patient is now 48 hours without SI. During the time of stated SI patient was actively withdrawing and in immense pain. As the pain and substances subside, patient is no longer endorsing SI. This judgment is based on our ability to directly address suicide risk, implement suicide prevention strategies and develop a safety plan while the patient is in the clinical  setting. Please contact our team if there is a concern that risk level has changed.   ## Medications:  -- DISCONTINUED seroquel 50 mg TID  -- ADJUSTED TO zyprexa 7.5 mg qHS (may switch from Zydis to tablets if the former too expensive) -- TAPERED klonopin 0.5 mg BID (last dose 8/30 PM) to 0.25 mg BID (last dose 9/1 AM)  -- Flexeril 5 mg TID per primary -- PRN Phenergan per primary   ## Medical Decision Making Capacity:  Formal decision making capacity was not assessed for any particular decision as part of routine psychiatric evaluation    ## Further Work-up:  -- F/u TSH, lipid panel, CK, BMP   -- Most recent EKG on 01/26/22 had QtC of 393 -- Antipsychotic monitoring: A1c 5.0 (01/22/2022)   -- Pertinent labwork reviewed earlier this admission includes:  UDS + amphetamines, benzos, cocaine, THC Tylenol level <10, BAL <10 Hep A IgM, Hep B Sah, Hep B core IgM, HCV Ab, HIV - all NR Phos and Mg wnl (8/26), B12 WNL CK 488 > 2727 > 3535 > 2527 > 2744 > 4112 > 2615 Lipase 39   ## Disposition:  -- Home with brother, Dayton Martes, when medically stable. No longer recommended for inpatient psych as patient now 48 hours without SI, has good social support in place, and has been accepted for [facility] 9/5. Patient would not have clinical benefit from going to inpatient psych for 3 days prior to inpatient rehabilitation in the absence of urgent safety concerns, which is most necessary in patient's continued progress at the time.  ## Behavioral /  Environmental:  -- COWS per protocol -- Delirium precautions -- Suicide precautions-recommend discontinuation of the one-to-one sitter  ##Legal Status Voluntary  Thank you for this consult request. Recommendations have been communicated to the primary team.  We will sign-off at this time.   Lamar Sprinklesourtney Analynn Daum, MD   Follow-up history  Relevant Aspects of Hospital Course:  Admitted on 01/21/2022 for AMS (altered mental status) [R41.82].   NAEON. No vomiting for  3 days, and tolerated applesauce, half of Ensure, and 2 cups of juice this morning.   Patient Report:  No family present at bedside. Sitter present.   Patient reported sleeping well last night.  He continues to endorse abdominal pain, but denied nausea and vomiting at the time of assessment.  He says that Dilaudid only provides brief relief, for about 20 minutes, with pain fully restored at about 1 hour.  He did say that Prilosec and Tums have previously helped with this abdominal pain.  As well, he has taken numerous hot showers which provided some relief.  He is amenable to nonopioid pain relief measures moving forward.  He is informed that he was accepted to [facility] residential to start on 9/5, and that he would likely be discharged prior to starting rehabilitation.  Patient was able to contract for safety upon discharge, reiterating his safety plan that he would call [phone], talk to family, pray to God, or call 911 if he began to have thoughts of self-harm or suicidal ideation.  He denied cravings and advised that he would not be able to use substances while living with his brother.  He denies AVH today, stating that the last time he experienced both was approximately 1 week ago.  Patient denies prior acute concerns or complaints today. We discussed extensively that he will need to stop opiates for residential treatment  - he expressed he is agreeable to do so as his stomach pain improves.   ROS:  Mood:  ("Alright") Sleep: Fair Appetite: Limited, patient is afraid he will be unable to tolerate "heavier" foods. Hallucinations: Denies  Ideas of Reference: Denies  Suicidal Thoughts: Denies (Last passive SI was 8/30 AM, last active SI 8/29.) Homicidal Thoughts: Denies  Collateral information:  Spoke to brother, Dayton Martesron, who said that the patient will be living with him upon discharge from the hospital.  He was advised that his brother is no longer recommended for inpatient psychiatric admission, was  accepted [facility] on Tuesday, and would not be discharging with any opioid pain medications, to which he was agreeable.  Safety planning was completed with his brother in which he denied drugs, guns, or other weapons in the home, and was given information on what to do if his brother developed suicidal ideation including 988, 911, in the emergency department, or at the behavioral urgent care.  All of his questions were answered, and he was appreciative of the call.  Psychiatric History:  Information collected from chart review, family and patient Dx: Scz, bipolar d/o, bipolar affective disorder, cannabis use disorder, tobacco use d/o Prior suicide attempt: denied. SI (2016) Inpatient psych: yes [facility]) Outpatient psych: denied   Family psych history: Deferred    Social History:  Cannabis: Yes Tobacco use: He smokes a pack every 2 days.  Alcohol use: Rarely, does not remember last time Drug use: He also takes 30 mg of percocet 3-4 times a day for some time. This is not prescribed to him. Also benzo misuse.    Family History:  The patient's family history includes Colon cancer in his maternal uncle; Diabetes in his maternal aunt; Heart disease in his paternal grandfather; Liver cancer in his mother; Testicular cancer in his brother.   The patient's family history includes Colon cancer in his maternal uncle; Diabetes in his maternal aunt; Heart disease in his paternal grandfather; Liver cancer in his mother; Testicular cancer in his brother.  Medical History: Past Medical History:  Diagnosis Date   Bipolar 1 disorder (HCC)    Bipolar disorder (HCC)    Cyclic vomiting syndrome    GERD (gastroesophageal reflux disease)    Nephrolithiasis    Schizophrenia, acute (HCC)     Surgical History: Past Surgical History:  Procedure Laterality Date   APPENDECTOMY     unremarkable      Medications:   Current Facility-Administered Medications:    0.9 %  sodium chloride  infusion, , Intravenous, PRN, Lorin Glass, MD, Last Rate: 10 mL/hr at 01/26/22 0202, New Bag at 01/26/22 0202   alum & mag hydroxide-simeth (MAALOX/MYLANTA) 200-200-20 MG/5ML suspension 30 mL, 30 mL, Oral, Q4H PRN, Marinda Elk, MD, 30 mL at 01/28/22 2241   amLODipine (NORVASC) tablet 5 mg, 5 mg, Oral, Daily, Sharl Ma, Gagan S, MD, 5 mg at 01/29/22 0802   baclofen (LIORESAL) tablet 5 mg, 5 mg, Oral, TID PRN, Marinda Elk, MD, 5 mg at 01/29/22 0252   bisacodyl (DULCOLAX) suppository 10 mg, 10 mg, Rectal, Daily PRN, Lorin Glass, MD   capsaicin (ZOSTRIX) 0.025 % cream, , Topical, BID, Melene Plan, DO, Given at 01/29/22 0801   Chlorhexidine Gluconate Cloth 2 % PADS 6 each, 6 each, Topical, Q0600, Leslye Peer, MD, 6 each at 01/29/22 0303   cyclobenzaprine (FLEXERIL) tablet 5 mg, 5 mg, Oral, TID, Lorin Glass, MD, 5 mg at 01/29/22 0802   docusate sodium (COLACE) capsule 100 mg, 100 mg, Oral, BID PRN, Gleason, Darcella Gasman, PA-C, 100 mg at 01/29/22 0823   enoxaparin (LOVENOX) injection 40 mg, 40 mg, Subcutaneous, Q24H, Lorin Glass, MD, 40 mg at 01/29/22 0817   feeding supplement (ENSURE ENLIVE / ENSURE PLUS) liquid 237 mL, 237 mL, Oral, TID BM, Marinda Elk, MD, 237 mL at 01/29/22 0803   haloperidol lactate (HALDOL) injection 5 mg, 5 mg, Intravenous, Q6H PRN, Marinda Elk, MD, 5 mg at 01/26/22 1635   hydrALAZINE (APRESOLINE) tablet 25 mg, 25 mg, Oral, Q6H PRN, Sharl Ma, Sarina Ill, MD   metoCLOPramide (REGLAN) tablet 10 mg, 10 mg, Oral, TID AC & HS, Sharl Ma, Sarina Ill, MD, 10 mg at 01/29/22 1202   OLANZapine zydis (ZYPREXA) disintegrating tablet 7.5 mg, 7.5 mg, Oral, QHS, Princess Bruins, DO   ondansetron [facility]) injection 4 mg, 4 mg, Intravenous, Q6H PRN, Gleason, Darcella Gasman, PA-C, 4 mg at 01/25/22 1356   Oral care mouth rinse, 15 mL, Mouth Rinse, PRN, Lorin Glass, MD   oxyCODONE-acetaminophen (PERCOCET/ROXICET) 5-325 MG per tablet 1-2 tablet, 1-2 tablet, Oral, Q4H PRN, Sharl Ma,  Sarina Ill, MD   pantoprazole (PROTONIX) EC tablet 40 mg, 40 mg, Oral, BID, Sharl Ma, Sarina Ill, MD   polyethylene glycol (MIRALAX / GLYCOLAX) packet 17 g, 17 g, Oral, Daily PRN, Gleason, Darcella Gasman, PA-C, 17 g at 01/23/22 0926   promethazine (PHENERGAN) 12.5 mg in sodium chloride 0.9 % 50 mL IVPB, 12.5 mg, Intravenous, Q6H PRN, Lorin Glass, MD, Last Rate: 200 mL/hr at 01/26/22 0203, 12.5 mg at 01/26/22 0203  Allergies: Allergies  Allergen Reactions   Gluten Meal  Nausea And Vomiting    Stomach pains   Haldol [Haloperidol]     Makes me anxious    Milk-Related Compounds Nausea And Vomiting    Stomach upset   Latex Rash       Objective  Vital signs:  Temp:  [97.8 F (36.6 C)-99.6 F (37.6 C)] 97.8 F (36.6 C) (09/01 0717) Pulse Rate:  [64-84] 64 (09/01 0717) Resp:  [16-18] 16 (09/01 0717) BP: (120-164)/(82-88) 120/82 (09/01 0717) SpO2:  [99 %-100 %] 100 % (09/01 0717)  Psychiatric Specialty Exam:  Presentation  General Appearance: Appropriate for Environment (Initially lying in bed with knees to chest, but then on the floor shifting in pain)  Eye Contact:Good  Speech:Clear and Coherent; Normal Rate  Speech Volume:Normal  Handedness:Right   Mood and Affect  Mood:-- ("Alright")  Affect:Appropriate; Congruent   Thought Process  Thought Processes:Coherent; Goal Directed; Linear  Descriptions of Associations:Intact  Orientation:Full (Time, Place and Person)  Thought Content:Logical; WDL  History of Schizophrenia/Schizoaffective disorder:No data recorded Duration of Psychotic Symptoms:No data recorded Hallucinations:Hallucinations: None  Ideas of Reference:None  Suicidal Thoughts:Suicidal Thoughts: No  Homicidal Thoughts:Homicidal Thoughts: No   Sensorium  Memory:Immediate Good; Recent Fair  Judgment:Fair  Insight:Fair   Executive Functions  Concentration:Good  Attention Span:Good  Recall:Good  Fund of  Knowledge:Good  Language:Good   Psychomotor Activity  Psychomotor Activity:Psychomotor Activity: Normal   Assets  Assets:Communication Skills; Desire for Improvement; Housing; Social Support   Sleep  Sleep:Sleep: Fair    Physical Exam: Physical Exam Vitals reviewed.  Constitutional:      General: He is not in acute distress.    Appearance: He is not toxic-appearing.     Comments: Lies in positions for pain relief  HENT:     Head: Normocephalic and atraumatic.  Pulmonary:     Effort: Pulmonary effort is normal.  Abdominal:     General: Abdomen is flat.  Skin:    General: Skin is dry.  Neurological:     Mental Status: He is alert and oriented to person, place, and time.     Blood pressure 120/82, pulse 64, temperature 97.8 F (36.6 C), temperature source Oral, resp. rate 16, height 6\' 2"  (1.88 m), weight 62 kg, SpO2 100 %. Body mass index is 17.55 kg/m.

## 2022-01-30 LAB — CBC
HCT: 31.8 % — ABNORMAL LOW (ref 39.0–52.0)
Hemoglobin: 11.4 g/dL — ABNORMAL LOW (ref 13.0–17.0)
MCH: 33.7 pg (ref 26.0–34.0)
MCHC: 35.8 g/dL (ref 30.0–36.0)
MCV: 94.1 fL (ref 80.0–100.0)
Platelets: 204 10*3/uL (ref 150–400)
RBC: 3.38 MIL/uL — ABNORMAL LOW (ref 4.22–5.81)
RDW: 11.4 % — ABNORMAL LOW (ref 11.5–15.5)
WBC: 5.9 10*3/uL (ref 4.0–10.5)
nRBC: 0 % (ref 0.0–0.2)

## 2022-01-30 LAB — COMPREHENSIVE METABOLIC PANEL
ALT: 26 U/L (ref 0–44)
AST: 26 U/L (ref 15–41)
Albumin: 4.3 g/dL (ref 3.5–5.0)
Alkaline Phosphatase: 57 U/L (ref 38–126)
Anion gap: 9 (ref 5–15)
BUN: 11 mg/dL (ref 6–20)
CO2: 30 mmol/L (ref 22–32)
Calcium: 9.4 mg/dL (ref 8.9–10.3)
Chloride: 95 mmol/L — ABNORMAL LOW (ref 98–111)
Creatinine, Ser: 0.92 mg/dL (ref 0.61–1.24)
GFR, Estimated: >60 mL/min (ref >60)
Glucose, Bld: 111 mg/dL — ABNORMAL HIGH (ref 70–99)
Potassium: 3.4 mmol/L — ABNORMAL LOW (ref 3.5–5.1)
Sodium: 134 mmol/L — ABNORMAL LOW (ref 135–145)
Total Bilirubin: 0.6 mg/dL (ref 0.3–1.2)
Total Protein: 7.1 g/dL (ref 6.5–8.1)

## 2022-01-30 LAB — LIPASE, BLOOD: Lipase: 34 U/L (ref 11–51)

## 2022-01-30 MED ORDER — HYDROMORPHONE HCL 1 MG/ML IJ SOLN
1.0000 mg | Freq: Once | INTRAMUSCULAR | Status: AC
  Administered 2022-01-30: 1 mg via INTRAVENOUS
  Filled 2022-01-30: qty 1

## 2022-01-30 MED ORDER — LACTATED RINGERS IV BOLUS
1000.0000 mL | Freq: Once | INTRAVENOUS | Status: AC
  Administered 2022-01-30: 1000 mL via INTRAVENOUS

## 2022-01-30 MED ORDER — PROCHLORPERAZINE EDISYLATE 10 MG/2ML IJ SOLN
10.0000 mg | Freq: Once | INTRAMUSCULAR | Status: AC
  Administered 2022-01-30: 10 mg via INTRAVENOUS
  Filled 2022-01-30: qty 2

## 2022-01-30 NOTE — Discharge Instructions (Signed)
You were evaluated in the Emergency Department and after careful evaluation, we did not find any emergent condition requiring admission or further testing in the hospital.  Your exam/testing today is overall reassuring.  Please return to the Emergency Department if you experience any worsening of your condition.   Thank you for allowing us to be a part of your care. 

## 2022-01-30 NOTE — ED Provider Notes (Signed)
[facility]-EMERGENCY DEPT [facility] Emergency Department Provider Note MRN:  [id number]  Arrival date & time: 01/30/22     Chief Complaint   Abdominal Pain (Emesis)   History of Present Illness   Erik Cowan is a 36 y.o. year-old male with a history of bipolar disorder, cyclical vomiting syndrome presenting to the ED with chief complaint of abdominal pain.  Diffuse abdominal pain with persistent nausea vomiting for several days.  Explains that he was recently in the hospital with similar symptoms.  He was unable to pick up his home medications and so 4 hours after discharge the symptoms started back up.  Denies diarrhea, no fever.  Review of Systems  A thorough review of systems was obtained and all systems are negative except as noted in the HPI and PMH.   Patient's Health History    Past Medical History:  Diagnosis Date   Bipolar 1 disorder (HCC)    Bipolar disorder (HCC)    Cyclic vomiting syndrome    GERD (gastroesophageal reflux disease)    Nephrolithiasis    Schizophrenia, acute (HCC)     Past Surgical History:  Procedure Laterality Date   APPENDECTOMY     unremarkable      Family History  Problem Relation Age of Onset   Liver cancer Mother    Colon cancer Maternal Uncle    Diabetes Maternal Aunt    Heart disease Paternal Grandfather    Testicular cancer Brother     Social History   Socioeconomic History   Marital status: Single    Spouse name: Not on file   Number of children: Not on file   Years of education: Not on file   Highest education level: Not on file  Occupational History   Occupation: KITCHEN CREW    Employer: [employer]    Comment: [employer]  Tobacco Use   Smoking status: Every Day    Years: 1.00    Types: Cigarettes    Last attempt to quit: 06/04/2007    Years since quitting: 14.6   Smokeless tobacco: Never   Tobacco comments:    see illicit drug comment  Substance and Sexual Activity   Alcohol use: Yes     Comment: occasional-once per month   Drug use: Yes    Frequency: 7.0 times per week    Types: Marijuana    Comment: smokes marijuana daily   Sexual activity: Never  Other Topics Concern   Not on file  Social History Narrative   Single.  Lives with his Aunt. Works part-time as a Buyer, retail.   Social Determinants of Health   Financial Resource Strain: Not on file  Food Insecurity: Not on file  Transportation Needs: Not on file  Physical Activity: Not on file  Stress: Not on file  Social Connections: Not on file  Intimate Partner Violence: Not on file     Physical Exam   Vitals:   01/29/22 2329 01/30/22 0045  BP: (!) 162/91 (!) 158/82  Pulse: 77 76  Resp: 18   Temp: 98.3 F (36.8 C)   SpO2: 98% 100%    CONSTITUTIONAL: Well-appearing, in mild to moderate distress due to discomfort NEURO/PSYCH:  Alert and oriented x 3, no focal deficits EYES:  eyes equal and reactive ENT/NECK:  no LAD, no JVD CARDIO: Regular rate, well-perfused, normal S1 and S2 PULM:  CTAB no wheezing or rhonchi GI/GU:  non-distended, non-tender MSK/SPINE:  No gross deformities, no edema SKIN:  no rash, atraumatic   *  Additional and/or pertinent findings included in MDM below  Diagnostic and Interventional Summary    EKG Interpretation  Date/Time:    Ventricular Rate:    PR Interval:    QRS Duration:   QT Interval:    QTC Calculation:   R Axis:     Text Interpretation:         Labs Reviewed  COMPREHENSIVE METABOLIC PANEL - Abnormal; Notable for the following components:      Result Value   Sodium 134 (*)    Potassium 3.4 (*)    Chloride 95 (*)    Glucose, Bld 111 (*)    All other components within normal limits  CBC - Abnormal; Notable for the following components:   RBC 3.38 (*)    Hemoglobin 11.4 (*)    HCT 31.8 (*)    RDW 11.4 (*)    All other components within normal limits  LIPASE, BLOOD  URINALYSIS, ROUTINE W REFLEX MICROSCOPIC    No orders to display     Medications  HYDROmorphone (DILAUDID) injection 1 mg (has no administration in time range)  ondansetron (ZOFRAN-ODT) disintegrating tablet 4 mg (4 mg Oral Given 01/29/22 2333)  lactated ringers bolus 1,000 mL (1,000 mLs Intravenous New Bag/Given 01/30/22 0038)  HYDROmorphone (DILAUDID) injection 1 mg (1 mg Intravenous Given 01/30/22 0038)  prochlorperazine (COMPAZINE) injection 10 mg (10 mg Intravenous Given 01/30/22 0037)     Procedures  /  Critical Care Procedures  ED Course and Medical Decision Making  Initial Impression and Ddx Suspect recurrence of cyclical vomiting.  Recent admission for the same as well as encephalopathy.  He is alert and oriented fully on this evaluation, not in any psychiatric crisis.  Per chart review he was very challenging to sedate and image.  Despite all of his symptoms he had a CT abdomen that was without acute process.  Abdomen is soft and nontender at this time, reassuring vital signs, will attempt to control his symptoms.  Past medical/surgical history that increases complexity of ED encounter: Cyclical vomiting syndrome  Interpretation of Diagnostics I personally reviewed the laboratory assessment and my interpretation is as follows: No significant blood count or electrolyte disturbance    Patient Reassessment and Ultimate Disposition/Management     Feeling better on reassessment after medications listed above, appropriate for discharge.  Patient management required discussion with the following services or consulting groups:  None  Complexity of Problems Addressed Acute illness or injury that poses threat of life of bodily function  Additional Data Reviewed and Analyzed Further history obtained from: Recent discharge summary  Additional Factors Impacting ED Encounter Risk Use of parenteral controlled substances  Elmer Sow. Pilar Plate, MD [facility] Emergency Medicine [facility] [email or web address]  Final Clinical Impressions(s) /  ED Diagnoses     ICD-10-CM   1. Abdominal pain, unspecified abdominal location  R10.9       ED Discharge Orders     None        Discharge Instructions Discussed with and Provided to Patient:     Discharge Instructions      You were evaluated in the Emergency Department and after careful evaluation, we did not find any emergent condition requiring admission or further testing in the hospital.  Your exam/testing today is overall reassuring.  Please return to the Emergency Department if you experience any worsening of your condition.   Thank you for allowing [place] to be a part of your care.       Kennis Carina  M, MD 01/30/22 4619

## 2022-02-01 ENCOUNTER — Emergency Department (HOSPITAL_COMMUNITY)
Admission: EM | Admit: 2022-02-01 | Discharge: 2022-02-01 | Disposition: A | Discharge status: Home / Self Care | Payer: Self-pay | Attending: Emergency Medicine | Admitting: Emergency Medicine

## 2022-02-01 ENCOUNTER — Emergency Department (HOSPITAL_COMMUNITY): Payer: Self-pay

## 2022-02-01 ENCOUNTER — Encounter (HOSPITAL_COMMUNITY): Payer: Self-pay | Admitting: Emergency Medicine

## 2022-02-01 DIAGNOSIS — R112 Nausea with vomiting, unspecified: Secondary | ICD-10-CM | POA: Insufficient documentation

## 2022-02-01 DIAGNOSIS — Z91040 Latex allergy status: Secondary | ICD-10-CM | POA: Insufficient documentation

## 2022-02-01 DIAGNOSIS — R1013 Epigastric pain: Secondary | ICD-10-CM | POA: Insufficient documentation

## 2022-02-01 DIAGNOSIS — R1110 Vomiting, unspecified: Secondary | ICD-10-CM

## 2022-02-01 LAB — BASIC METABOLIC PANEL
Anion gap: 8 (ref 5–15)
BUN: 9 mg/dL (ref 6–20)
CO2: 30 mmol/L (ref 22–32)
Calcium: 9.4 mg/dL (ref 8.9–10.3)
Chloride: 94 mmol/L — ABNORMAL LOW (ref 98–111)
Creatinine, Ser: 1 mg/dL (ref 0.61–1.24)
GFR, Estimated: >60 mL/min (ref >60)
Glucose, Bld: 117 mg/dL — ABNORMAL HIGH (ref 70–99)
Potassium: 3.7 mmol/L (ref 3.5–5.1)
Sodium: 132 mmol/L — ABNORMAL LOW (ref 135–145)

## 2022-02-01 LAB — CBC
HCT: 31.6 % — ABNORMAL LOW (ref 39.0–52.0)
Hemoglobin: 11.5 g/dL — ABNORMAL LOW (ref 13.0–17.0)
MCH: 34 pg (ref 26.0–34.0)
MCHC: 36.4 g/dL — ABNORMAL HIGH (ref 30.0–36.0)
MCV: 93.5 fL (ref 80.0–100.0)
Platelets: 233 10*3/uL (ref 150–400)
RBC: 3.38 MIL/uL — ABNORMAL LOW (ref 4.22–5.81)
RDW: 11.7 % (ref 11.5–15.5)
WBC: 6.7 10*3/uL (ref 4.0–10.5)
nRBC: 0 % (ref 0.0–0.2)

## 2022-02-01 LAB — LIPASE, BLOOD: Lipase: 24 U/L (ref 11–51)

## 2022-02-01 LAB — TROPONIN I (HIGH SENSITIVITY)
Troponin I (High Sensitivity): 5 ng/L (ref <18)
Troponin I (High Sensitivity): 4 ng/L (ref <18)

## 2022-02-01 MED ORDER — DROPERIDOL 2.5 MG/ML IJ SOLN
2.5000 mg | Freq: Once | INTRAMUSCULAR | Status: AC
  Administered 2022-02-01: 2.5 mg via INTRAVENOUS
  Filled 2022-02-01: qty 2

## 2022-02-01 NOTE — ED Notes (Signed)
Pt left wo waiting on the provider to go over the discharge instructions or having nursing staff to take take his discharge vital signs. PA made aware. Pt's departure condition looks good w steady gait and no complains of pain.

## 2022-02-01 NOTE — ED Triage Notes (Signed)
Patient with persistent n/v since 2 days ago along with chest pain that when asking to describes, "I have hiccups times 10".  Endorses epigastric pain.

## 2022-02-01 NOTE — ED Provider Notes (Signed)
[facility] EMERGENCY DEPARTMENT Provider Note   CSN: [id number] Arrival date & time: 02/01/22  1761     History  Chief Complaint  Patient presents with   Nausea   Emesis   Chest Pain    Erik Cowan is a 36 y.o. male.  36 year old male with a past medical history of hyperemesis presents to the ED with a chief complaint of abdominal pain, nausea, vomiting for the past 2 days.  Patient was evaluated in the emergency department 2 days ago, discharged home in stable condition.  Today he continues to endorse epigastric pain, exacerbated with any vomiting.  Has not taken any medication for improvement in symptoms.  There is no alleviating factors.  Endorses a burning sensation along the epigastric region radiating up his chest.  Has not been any hematemesis.  Denies any alcohol use, no drug use that he is reported.  Prior to the ED hypertensive but afebrile.  No fever, no shortness of breath, no diarrhea, no blood in his stool.  The history is provided by the patient.  Emesis Associated symptoms: abdominal pain   Associated symptoms: no fever and no sore throat   Chest Pain Associated symptoms: abdominal pain, nausea and vomiting   Associated symptoms: no back pain, no fever and no shortness of breath        Home Medications Prior to Admission medications   Medication Sig Start Date End Date Taking? Authorizing Provider  amLODipine (NORVASC) 5 MG tablet Take 1 tablet (5 mg total) by mouth daily. 01/30/22   Meredeth Ide, MD  baclofen 5 MG TABS Take 5 mg by mouth 3 (three) times daily as needed for muscle spasms (hiccups). 01/29/22   Meredeth Ide, MD  cyclobenzaprine (FLEXERIL) 5 MG tablet Take 1 tablet (5 mg total) by mouth 3 (three) times daily. 01/29/22   Meredeth Ide, MD  docusate sodium (COLACE) 100 MG capsule Take 1 capsule (100 mg total) by mouth 2 (two) times daily as needed for mild constipation. 01/29/22   Meredeth Ide, MD  metoCLOPramide (REGLAN) 10 MG  tablet Take 1 tablet (10 mg total) by mouth 4 (four) times daily -  before meals and at bedtime. 01/29/22   Meredeth Ide, MD  Multiple Vitamin (MULTIVITAMIN WITH MINERALS) TABS tablet Take 1 tablet by mouth daily. 01/27/21   Sheikh, Omair Latif, DO  nicotine (NICODERM CQ - DOSED IN MG/24 HOURS) 14 mg/24hr patch Place 1 patch (14 mg total) onto the skin daily. Patient not taking: Reported on 01/22/2022 01/27/21   Marguerita Merles Latif, DO  OLANZapine zydis (ZYPREXA) 15 MG disintegrating tablet Take 0.5 tablets (7.5 mg total) by mouth at bedtime. 01/29/22   Meredeth Ide, MD  oxyCODONE-acetaminophen (PERCOCET) 5-325 MG tablet Take 1-2 tablets by mouth every 8 (eight) hours as needed for severe pain. 01/29/22 01/29/23  Meredeth Ide, MD  pantoprazole (PROTONIX) 40 MG tablet Take 1 tablet (40 mg total) by mouth 2 (two) times daily. 01/29/22   Meredeth Ide, MD      Allergies    Gluten meal, Haldol [haloperidol], Milk-related compounds, and Latex    Review of Systems   Review of Systems  Constitutional:  Negative for fever.  HENT:  Negative for sore throat.   Respiratory:  Negative for shortness of breath.   Cardiovascular:  Positive for chest pain.  Gastrointestinal:  Positive for abdominal pain, nausea and vomiting.  Genitourinary:  Negative for flank pain.  Musculoskeletal:  Negative for  back pain.  Neurological:  Negative for light-headedness.  All other systems reviewed and are negative.   Physical Exam Updated Vital Signs BP (!) 149/109   Pulse 68   Temp 98.8 F (37.1 C) (Oral)   Resp 15   SpO2 100%  Physical Exam Vitals and nursing note reviewed.  Constitutional:      Appearance: He is well-developed. He is not ill-appearing or toxic-appearing.  HENT:     Head: Normocephalic and atraumatic.  Cardiovascular:     Rate and Rhythm: Normal rate.  Pulmonary:     Effort: Pulmonary effort is normal.     Breath sounds: No decreased breath sounds or wheezing.  Chest:     Chest wall: No  tenderness.  Abdominal:     Palpations: Abdomen is soft.     Tenderness: There is abdominal tenderness. There is no guarding or rebound.  Musculoskeletal:     Cervical back: Normal range of motion and neck supple.  Skin:    General: Skin is warm and dry.  Neurological:     Mental Status: He is alert.     ED Results / Procedures / Treatments   Labs (all labs ordered are listed, but only abnormal results are displayed) Labs Reviewed  BASIC METABOLIC PANEL - Abnormal; Notable for the following components:      Result Value   Sodium 132 (*)    Chloride 94 (*)    Glucose, Bld 117 (*)    All other components within normal limits  CBC - Abnormal; Notable for the following components:   RBC 3.38 (*)    Hemoglobin 11.5 (*)    HCT 31.6 (*)    MCHC 36.4 (*)    All other components within normal limits  LIPASE, BLOOD  URINALYSIS, ROUTINE W REFLEX MICROSCOPIC  TROPONIN I (HIGH SENSITIVITY)  TROPONIN I (HIGH SENSITIVITY)    EKG EKG Interpretation  Date/Time:  Monday February 01 2022 07:12:44 EDT Ventricular Rate:  68 PR Interval:  168 QRS Duration: 78 QT Interval:  392 QTC Calculation: 416 R Axis:   88 Text Interpretation: Normal sinus rhythm Right atrial enlargement No significant change since last tracing When compared with ECG of 28-Jan-2022 12:45, PREVIOUS ECG IS PRESENT Confirmed by Gwyneth Sprout ([id number]) on 02/01/2022 7:34:26 AM  Radiology DG Chest 2 View  Result Date: 02/01/2022 CLINICAL DATA:  Nausea.  Vomiting.  Hiccups and chest pain. EXAM: CHEST - 2 VIEW COMPARISON:  01/21/2021. FINDINGS: Normal heart, mediastinum and hila. Lungs are clear.  No pleural effusion or pneumothorax. Skeletal structures are unremarkable. IMPRESSION: No active cardiopulmonary disease. Electronically Signed   By: Amie Portland M.D.   On: 02/01/2022 08:21    Procedures Procedures    Medications Ordered in ED Medications  droperidol (INAPSINE) 2.5 MG/ML injection 2.5 mg (2.5 mg  Intravenous Given 02/01/22 0845)    ED Course/ Medical Decision Making/ A&P                           Medical Decision Making Amount and/or Complexity of Data Reviewed Labs: ordered. Radiology: ordered.  Risk Prescription drug management.   Patient here with a chief complaint of nausea, vomiting for the past couple of days.  Seen here yesterday and discharged home in stable condition.  Today he returns with worsening symptoms.  On arrival vitals are within normal limits aside from hypertension, he is overall in no distress although he was found leaning on the side of the  bed.  His abdomen is soft, mildly tender to palpation but no focal tenderness noted.  Bowel sounds are somewhat diminished.  I did review his chart previously received rounds of Dilaudid for abdominal pain which is currently requesting on today's visit.  EKG was obtained with no signs of prolonged QTc, consider droperidol and given this medication.Labs are begin.  After given this medication patient continues to ask for Dilaudid for abdominal pain.  I was told by nursing staff that patient had improvement in symptoms and he eloped from the emergency department AGAINST MEDICAL ADVICE. I do suspect some substance abuse at this time, with prior history of polysubstance disorder.    Portions of this note were generated with Scientist, clinical (histocompatibility and immunogenetics). Dictation errors may occur despite best attempts at proofreading.  Final Clinical Impression(s) / ED Diagnoses Final diagnoses:  Hyperemesis    Rx / DC Orders ED Discharge Orders     None         Claude Manges, PA-C 02/01/22 1109    Gwyneth Sprout, MD 02/03/22 1111

## 2022-02-03 ENCOUNTER — Encounter (HOSPITAL_COMMUNITY): Payer: Self-pay | Admitting: Emergency Medicine

## 2022-02-03 ENCOUNTER — Emergency Department (HOSPITAL_COMMUNITY)
Admission: EM | Admit: 2022-02-03 | Discharge: 2022-02-03 | Disposition: A | Discharge status: Home / Self Care | Payer: Self-pay | Attending: Emergency Medicine | Admitting: Emergency Medicine

## 2022-02-03 DIAGNOSIS — Z91040 Latex allergy status: Secondary | ICD-10-CM | POA: Insufficient documentation

## 2022-02-03 DIAGNOSIS — Z760 Encounter for issue of repeat prescription: Secondary | ICD-10-CM | POA: Insufficient documentation

## 2022-02-03 MED ORDER — DIPHENHYDRAMINE HCL 25 MG PO TABS: 25.0000 mg | ORAL_TABLET | Freq: Four times a day (QID) | ORAL | 0 refills | Status: DC | PRN

## 2022-02-03 MED ORDER — OLANZAPINE 15 MG PO TBDP: 7.5000 mg | ORAL_TABLET | Freq: Every day | ORAL | 0 refills | Status: DC

## 2022-02-03 MED ORDER — METOCLOPRAMIDE HCL 10 MG PO TABS: 10.0000 mg | ORAL_TABLET | Freq: Four times a day (QID) | ORAL | 0 refills | Status: DC | PRN

## 2022-02-03 MED ORDER — AMLODIPINE BESYLATE 5 MG PO TABS: 5.0000 mg | ORAL_TABLET | Freq: Every day | ORAL | 3 refills | Status: DC

## 2022-02-03 NOTE — Discharge Instructions (Addendum)
Patient was seen in the emergency room and had a physical exam and medical reconciliation done.  He is currently prescribed the below medications.    Amlodipine for hypertension to take once daily Reglan/Benadryl to take for nausea as needed up to every 6 hours Zyprexa 7.5 mg (take 1/2 a tablet daily) at bedtime

## 2022-02-03 NOTE — ED Triage Notes (Signed)
Pt going to [facility] tomorrow and needs medlist and refill of meds recently prescribed here. Pt states he needs tramadol.

## 2022-02-03 NOTE — ED Provider Notes (Signed)
[facility] EMERGENCY DEPARTMENT Provider Note   CSN: [id number] Arrival date & time: 02/03/22  1749     History  Chief Complaint  Patient presents with   Medication Refill    Erik Cowan is a 36 y.o. male.   Medication Refill Patient is here requesting a list of medications that he is prescribed and refilled his medications that he can enter a [place] facility tomorrow for inpatient treatment of his psychiatric conditions.  He states he feels well today.  He has not taken any medications in 3 days but states that he would like to restart his medications.     Home Medications Prior to Admission medications   Medication Sig Start Date End Date Taking? Authorizing Provider  amLODipine (NORVASC) 5 MG tablet Take 1 tablet (5 mg total) by mouth daily. 02/03/22   Gailen Shelter, PA  baclofen 5 MG TABS Take 5 mg by mouth 3 (three) times daily as needed for muscle spasms (hiccups). 01/29/22   Meredeth Ide, MD  diphenhydrAMINE (BENADRYL) 25 MG tablet Take 1 tablet (25 mg total) by mouth every 6 (six) hours as needed. 02/03/22  Yes Lakesha Levinson S, PA  metoCLOPramide (REGLAN) 10 MG tablet Take 1 tablet (10 mg total) by mouth every 6 (six) hours as needed for nausea. 02/03/22   Delene Morais, Rodrigo Ran, PA  OLANZapine zydis (ZYPREXA) 15 MG disintegrating tablet Take 0.5 tablets (7.5 mg total) by mouth at bedtime. 02/03/22   Gailen Shelter, PA  pantoprazole (PROTONIX) 40 MG tablet Take 1 tablet (40 mg total) by mouth 2 (two) times daily. 01/29/22   Meredeth Ide, MD      Allergies    Gluten meal, Haldol [haloperidol], Milk-related compounds, and Latex    Review of Systems   Review of Systems  Physical Exam Updated Vital Signs BP (!) 147/100   Pulse 86   Temp 98.7 F (37.1 C) (Oral)   Resp 16   Ht 6\' 2"  (1.88 m)   Wt 62 kg   SpO2 100%   BMI 17.55 kg/m  Physical Exam Vitals and nursing note reviewed.  Constitutional:      General: He is not in acute distress.     Appearance: Normal appearance. He is not ill-appearing.  HENT:     Head: Normocephalic and atraumatic.     Nose: Nose normal.  Eyes:     General: No scleral icterus.       Right eye: No discharge.        Left eye: No discharge.     Conjunctiva/sclera: Conjunctivae normal.  Cardiovascular:     Rate and Rhythm: Normal rate and regular rhythm.     Pulses: Normal pulses.     Heart sounds: Normal heart sounds.  Pulmonary:     Effort: Pulmonary effort is normal.     Breath sounds: No stridor.  Musculoskeletal:     Cervical back: Normal range of motion.     Right lower leg: No edema.     Left lower leg: No edema.  Skin:    General: Skin is warm and dry.     Capillary Refill: Capillary refill takes less than 2 seconds.  Neurological:     Mental Status: He is alert and oriented to person, place, and time. Mental status is at baseline.  Psychiatric:        Mood and Affect: Mood normal.        Behavior: Behavior normal.     ED  Results / Procedures / Treatments   Labs (all labs ordered are listed, but only abnormal results are displayed) Labs Reviewed - No data to display  EKG None  Radiology No results found.  Procedures Procedures    Medications Ordered in ED Medications - No data to display  ED Course/ Medical Decision Making/ A&P                           Medical Decision Making Risk OTC drugs. Prescription drug management.   Patient is here requesting a list of medications that he is prescribed and refilled his medications that he can enter a [place] facility tomorrow for inpatient treatment of his psychiatric conditions.  He states he feels well today.  He has not taken any medications in 3 days but states that he would like to restart his medications.  Physical exam unremarkable.  Patient is well-appearing has no complaints today.  Medications reviewed.  Will represcribe amlodipine Reglan/Benadryl, Zyprexa  We will discharge home at this time.  Return  precautions discussed.  Final Clinical Impression(s) / ED Diagnoses Final diagnoses:  Medication refill    Rx / DC Orders ED Discharge Orders          Ordered    amLODipine (NORVASC) 5 MG tablet  Daily        02/03/22 1822    metoCLOPramide (REGLAN) 10 MG tablet  Every 6 hours PRN        02/03/22 1822    diphenhydrAMINE (BENADRYL) 25 MG tablet  Every 6 hours PRN        02/03/22 1822    OLANZapine zydis (ZYPREXA) 15 MG disintegrating tablet  Daily at bedtime        02/03/22 1822              Solon Augusta Webster, [place] 02/03/22 1832    Charlynne Pander, MD 02/03/22 2242

## 2022-02-05 ENCOUNTER — Telehealth: Payer: Self-pay

## 2022-02-05 NOTE — Telephone Encounter (Signed)
Patients brother called in to state the [facility] does not have zyprexa tablets,, wondered if it could be called to a different  pharmacy. Called [facility] [address], they already had the prescription there, but  the cost was 600. Even with good RX, it was over 100. MATCH done for patient as he is uninsured. MATCH called into [facility] [address].   MATCH: Medication assistance Care  Name: Giovanni Biby  ID: [id number] Rx group: [id number] Brother is going to pick up medication

## 2022-11-07 ENCOUNTER — Other Ambulatory Visit: Payer: Self-pay

## 2022-11-07 ENCOUNTER — Emergency Department (HOSPITAL_COMMUNITY): Payer: Self-pay

## 2022-11-07 ENCOUNTER — Emergency Department (HOSPITAL_COMMUNITY)
Admission: EM | Admit: 2022-11-07 | Discharge: 2022-11-07 | Disposition: A | Discharge status: Home / Self Care | Payer: Self-pay | Attending: Emergency Medicine | Admitting: Emergency Medicine

## 2022-11-07 ENCOUNTER — Encounter (HOSPITAL_COMMUNITY): Payer: Self-pay | Admitting: Pharmacy Technician

## 2022-11-07 DIAGNOSIS — R112 Nausea with vomiting, unspecified: Secondary | ICD-10-CM

## 2022-11-07 DIAGNOSIS — Z91040 Latex allergy status: Secondary | ICD-10-CM | POA: Insufficient documentation

## 2022-11-07 DIAGNOSIS — R1116 Cannabis hyperemesis syndrome: Secondary | ICD-10-CM

## 2022-11-07 DIAGNOSIS — R1084 Generalized abdominal pain: Secondary | ICD-10-CM

## 2022-11-07 DIAGNOSIS — F1990 Other psychoactive substance use, unspecified, uncomplicated: Secondary | ICD-10-CM

## 2022-11-07 DIAGNOSIS — F1290 Cannabis use, unspecified, uncomplicated: Secondary | ICD-10-CM | POA: Insufficient documentation

## 2022-11-07 DIAGNOSIS — D72829 Elevated white blood cell count, unspecified: Secondary | ICD-10-CM | POA: Insufficient documentation

## 2022-11-07 DIAGNOSIS — E876 Hypokalemia: Secondary | ICD-10-CM

## 2022-11-07 DIAGNOSIS — I10 Essential (primary) hypertension: Secondary | ICD-10-CM

## 2022-11-07 DIAGNOSIS — R030 Elevated blood-pressure reading, without diagnosis of hypertension: Secondary | ICD-10-CM

## 2022-11-07 LAB — URINALYSIS, ROUTINE W REFLEX MICROSCOPIC
Bilirubin Urine: NEGATIVE
Glucose, UA: 150 mg/dL — AB
Ketones, ur: 5 mg/dL — AB
Leukocytes,Ua: NEGATIVE
Nitrite: NEGATIVE
Protein, ur: 100 mg/dL — AB
Specific Gravity, Urine: 1.02 (ref 1.005–1.030)
pH: 5 (ref 5.0–8.0)

## 2022-11-07 LAB — COMPREHENSIVE METABOLIC PANEL
ALT: 23 U/L (ref 0–44)
AST: 42 U/L — ABNORMAL HIGH (ref 15–41)
Albumin: 5.1 g/dL — ABNORMAL HIGH (ref 3.5–5.0)
Alkaline Phosphatase: 75 U/L (ref 38–126)
Anion gap: 14 (ref 5–15)
BUN: 10 mg/dL (ref 6–20)
CO2: 22 mmol/L (ref 22–32)
Calcium: 10.6 mg/dL — ABNORMAL HIGH (ref 8.9–10.3)
Chloride: 99 mmol/L (ref 98–111)
Creatinine, Ser: 1.05 mg/dL (ref 0.61–1.24)
GFR, Estimated: >60 mL/min (ref >60)
Glucose, Bld: 146 mg/dL — ABNORMAL HIGH (ref 70–99)
Potassium: 3 mmol/L — ABNORMAL LOW (ref 3.5–5.1)
Sodium: 135 mmol/L (ref 135–145)
Total Bilirubin: 1.1 mg/dL (ref 0.3–1.2)
Total Protein: 8.6 g/dL — ABNORMAL HIGH (ref 6.5–8.1)

## 2022-11-07 LAB — LIPASE, BLOOD: Lipase: 22 U/L (ref 11–51)

## 2022-11-07 LAB — CBC
HCT: 34.2 % — ABNORMAL LOW (ref 39.0–52.0)
Hemoglobin: 12.3 g/dL — ABNORMAL LOW (ref 13.0–17.0)
MCH: 34.6 pg — ABNORMAL HIGH (ref 26.0–34.0)
MCHC: 36 g/dL (ref 30.0–36.0)
MCV: 96.1 fL (ref 80.0–100.0)
Platelets: 181 10*3/uL (ref 150–400)
RBC: 3.56 MIL/uL — ABNORMAL LOW (ref 4.22–5.81)
RDW: 12.5 % (ref 11.5–15.5)
WBC: 17.9 10*3/uL — ABNORMAL HIGH (ref 4.0–10.5)
nRBC: 0 % (ref 0.0–0.2)

## 2022-11-07 LAB — RAPID URINE DRUG SCREEN, HOSP PERFORMED
Amphetamines: NOT DETECTED
Barbiturates: NOT DETECTED
Benzodiazepines: POSITIVE — AB
Cocaine: POSITIVE — AB
Opiates: NOT DETECTED
Tetrahydrocannabinol: POSITIVE — AB

## 2022-11-07 MED ORDER — KETOROLAC TROMETHAMINE 15 MG/ML IJ SOLN
15.0000 mg | Freq: Once | INTRAMUSCULAR | Status: AC
  Administered 2022-11-07: 15 mg via INTRAVENOUS
  Filled 2022-11-07: qty 1

## 2022-11-07 MED ORDER — POTASSIUM CHLORIDE CRYS ER 20 MEQ PO TBCR: EXTENDED_RELEASE_TABLET | ORAL | 0 refills | Status: DC

## 2022-11-07 MED ORDER — LORAZEPAM 2 MG/ML IJ SOLN
1.0000 mg | Freq: Once | INTRAMUSCULAR | Status: AC
  Administered 2022-11-07: 1 mg via INTRAVENOUS
  Filled 2022-11-07: qty 1

## 2022-11-07 MED ORDER — FAMOTIDINE 20 MG PO TABS
20.0000 mg | ORAL_TABLET | Freq: Once | ORAL | Status: AC
  Administered 2022-11-07: 20 mg via ORAL
  Filled 2022-11-07: qty 1

## 2022-11-07 MED ORDER — AMLODIPINE BESYLATE 5 MG PO TABS
5.0000 mg | ORAL_TABLET | Freq: Once | ORAL | Status: AC
  Administered 2022-11-07: 5 mg via ORAL
  Filled 2022-11-07: qty 1

## 2022-11-07 MED ORDER — DIAZEPAM 5 MG/ML IJ SOLN
5.0000 mg | Freq: Once | INTRAMUSCULAR | Status: AC
  Administered 2022-11-07: 5 mg via INTRAVENOUS
  Filled 2022-11-07: qty 2

## 2022-11-07 MED ORDER — SODIUM CHLORIDE 0.9 % IV SOLN
12.5000 mg | Freq: Once | INTRAVENOUS | Status: AC
  Administered 2022-11-07: 12.5 mg via INTRAVENOUS
  Filled 2022-11-07: qty 0.5

## 2022-11-07 MED ORDER — LACTATED RINGERS IV BOLUS
1000.0000 mL | Freq: Once | INTRAVENOUS | Status: AC
  Administered 2022-11-07: 1000 mL via INTRAVENOUS

## 2022-11-07 MED ORDER — DIPHENHYDRAMINE HCL 50 MG/ML IJ SOLN
25.0000 mg | Freq: Once | INTRAMUSCULAR | Status: AC
  Administered 2022-11-07: 25 mg via INTRAVENOUS
  Filled 2022-11-07: qty 1

## 2022-11-07 MED ORDER — HYDROMORPHONE HCL 1 MG/ML IJ SOLN
1.0000 mg | Freq: Once | INTRAMUSCULAR | Status: AC
  Administered 2022-11-07: 1 mg via INTRAVENOUS
  Filled 2022-11-07: qty 1

## 2022-11-07 MED ORDER — ONDANSETRON 8 MG PO TBDP: 8.0000 mg | ORAL_TABLET | Freq: Three times a day (TID) | ORAL | 0 refills | Status: DC | PRN

## 2022-11-07 MED ORDER — ONDANSETRON HCL 4 MG/2ML IJ SOLN
4.0000 mg | Freq: Once | INTRAMUSCULAR | Status: AC
  Administered 2022-11-07: 4 mg via INTRAVENOUS
  Filled 2022-11-07: qty 2

## 2022-11-07 MED ORDER — PANTOPRAZOLE SODIUM 40 MG IV SOLR
40.0000 mg | Freq: Once | INTRAVENOUS | Status: AC
  Administered 2022-11-07: 40 mg via INTRAVENOUS
  Filled 2022-11-07: qty 10

## 2022-11-07 MED ORDER — LIDOCAINE 5 % EX PTCH
1.0000 | MEDICATED_PATCH | CUTANEOUS | Status: DC
  Administered 2022-11-07: 1 via TRANSDERMAL
  Filled 2022-11-07 (×2): qty 1

## 2022-11-07 MED ORDER — ALUM & MAG HYDROXIDE-SIMETH 200-200-20 MG/5ML PO SUSP
30.0000 mL | Freq: Once | ORAL | Status: AC
  Administered 2022-11-07: 30 mL via ORAL
  Filled 2022-11-07: qty 30

## 2022-11-07 MED ORDER — MORPHINE SULFATE (PF) 4 MG/ML IV SOLN: 4.0000 mg | Freq: Once | INTRAVENOUS | Status: DC

## 2022-11-07 MED ORDER — ACETAMINOPHEN 500 MG PO TABS
1000.0000 mg | ORAL_TABLET | Freq: Once | ORAL | Status: AC
  Administered 2022-11-07: 1000 mg via ORAL
  Filled 2022-11-07: qty 2

## 2022-11-07 MED ORDER — POTASSIUM CHLORIDE 10 MEQ/100ML IV SOLN
10.0000 meq | Freq: Once | INTRAVENOUS | Status: DC
Start: 2022-11-07 — End: 2022-11-07

## 2022-11-07 MED ORDER — METOCLOPRAMIDE HCL 5 MG/ML IJ SOLN
20.0000 mg | Freq: Once | INTRAVENOUS | Status: AC
  Administered 2022-11-07: 20 mg via INTRAVENOUS
  Filled 2022-11-07: qty 4

## 2022-11-07 MED ORDER — POTASSIUM CHLORIDE 20 MEQ PO PACK
40.0000 meq | PACK | Freq: Once | ORAL | Status: AC
  Administered 2022-11-07: 40 meq via ORAL
  Filled 2022-11-07: qty 2

## 2022-11-07 MED ORDER — SODIUM CHLORIDE 0.9 % IV BOLUS: 1000.0000 mL | Freq: Once | INTRAVENOUS | Status: DC

## 2022-11-07 NOTE — ED Notes (Signed)
The pt will not keep his arm straight so his iv fluid can run in and each time it alarms the pt cuts it off at the pump  he walks to the br then comes in and lies down on the floor  then when he needs to get on the bed he reports that he canot move

## 2022-11-07 NOTE — ED Notes (Signed)
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

## 2022-11-07 NOTE — Discharge Instructions (Addendum)
It was our pleasure to provide your ER care today - we hope that you feel better.  Drink plenty of fluids/stay well hydrated. Take zofran as need for nausea. You may try pepcid and maalox for symptom relief.   Your blood pressure is high today - take your meds as prescribed, limit salt intake, and follow up closely with primary care doctor in 1-2 weeks.  Your potassium level is low - eat plenty of fruits and vegetables, take potassium supplement as prescribed, and follow up with primary care doctor in one week.   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   Avoid drug/substance use as it is harmful to your physical health and mental well-being. Follow up closely with substance use treatment resources provided.  For mental health issues and/or crisis, you may also go directly to the [facility] - it is open 24/7 and walk-ins are welcome.   Return to ER right away  if worse, fevers, new symptoms, new or worsening or severe abdominal pain, persistent vomiting, chest pain, trouble breathing, or other emergency concern.  You were given pain meds in the ER - no driving for the next 12 hours.

## 2022-11-07 NOTE — ED Notes (Signed)
Pt asking for med for back pain  he is crawling around on the bed all wires are off report from off going nurse was that he was non-compliant with everything and often crawling around on the floor

## 2022-11-07 NOTE — ED Notes (Signed)
The pt has stopped the iv pump numerus times

## 2022-11-07 NOTE — ED Notes (Signed)
The pt has gone to the br numerus times and he knows  there is an order for the urine

## 2022-11-07 NOTE — ED Notes (Signed)
Pt unable to sit still, flailing arms and legs in bed. Walking around room, pt c/o severe abdominal pain. PIV removed by pt. Pt currently laying on mat on floor.

## 2022-11-07 NOTE — ED Provider Notes (Signed)
[place] EMERGENCY DEPARTMENT AT [facility] Provider Note   CSN: [id number] Arrival date & time: 11/07/22  8756     History  Chief Complaint  Patient presents with   Flank Pain   Abdominal Pain    Erik Cowan is a 37 y.o. male.  37 y.o male with a PMH of kidney stones presents the ED with a chief complaint of bilateral flank pain, lower abdominal pain has been ongoing for the past 2 days.  Patient describes a severe stabbing pain to his bilateral flanks, exacerbated with any movement along with any palpation.  He has had multiple episodes of nonbilious, nonbloody emesis.  He tried to take "ibuprofen, pain pills ", but proceeded to vomit all of these.  When I entered the room, patient is writhing in pain in bed with his legs up in the air.  He also reports some difficulty with urination but no burning sensation.  Prior history of kidney stones with similar symptoms in the past.  No fever, no diarrhea, no chest pain.   The history is provided by the patient, medical records and the EMS personnel.  Flank Pain Associated symptoms include abdominal pain. Pertinent negatives include no chest pain, no headaches and no shortness of breath.  Abdominal Pain Associated symptoms: nausea   Associated symptoms: no chest pain, no diarrhea, no fever, no shortness of breath and no vomiting        Home Medications Prior to Admission medications   Medication Sig Start Date End Date Taking? Authorizing Provider  amLODipine (NORVASC) 5 MG tablet Take 1 tablet (5 mg total) by mouth daily. 02/03/22   Gailen Shelter, PA  baclofen 5 MG TABS Take 5 mg by mouth 3 (three) times daily as needed for muscle spasms (hiccups). 01/29/22   Meredeth Ide, MD  diphenhydrAMINE (BENADRYL) 25 MG tablet Take 1 tablet (25 mg total) by mouth every 6 (six) hours as needed. 02/03/22   Gailen Shelter, PA  metoCLOPramide (REGLAN) 10 MG tablet Take 1 tablet (10 mg total) by mouth every 6 (six) hours as needed for  nausea. 02/03/22   Fondaw, Rodrigo Ran, PA  OLANZapine zydis (ZYPREXA) 15 MG disintegrating tablet Take 0.5 tablets (7.5 mg total) by mouth at bedtime. 02/03/22   Gailen Shelter, PA  pantoprazole (PROTONIX) 40 MG tablet Take 1 tablet (40 mg total) by mouth 2 (two) times daily. 01/29/22   Meredeth Ide, MD      Allergies    Gluten meal, Haldol [haloperidol], Milk-related compounds, and Latex    Review of Systems   Review of Systems  Constitutional:  Negative for fever.  Respiratory:  Negative for shortness of breath.   Cardiovascular:  Negative for chest pain.  Gastrointestinal:  Positive for abdominal pain and nausea. Negative for diarrhea and vomiting.  Genitourinary:  Positive for flank pain.  Musculoskeletal:  Negative for back pain.  Skin:  Negative for pallor and wound.  Neurological:  Negative for light-headedness and headaches.  All other systems reviewed and are negative.   Physical Exam Updated Vital Signs BP (!) 175/131   Pulse 89   Temp 97.8 F (36.6 C) (Oral)   Resp (!) 24   SpO2 97%  Physical Exam Vitals and nursing note reviewed.  Constitutional:      Appearance: He is ill-appearing.  HENT:     Head: Normocephalic and atraumatic.     Mouth/Throat:     Pharynx: Oropharynx is clear.  Pulmonary:  Effort: Pulmonary effort is normal.     Breath sounds: No wheezing.  Abdominal:     General: Abdomen is flat. Bowel sounds are decreased.     Palpations: Abdomen is soft.     Tenderness: There is generalized abdominal tenderness. There is right CVA tenderness and left CVA tenderness.  Skin:    General: Skin is warm and dry.  Neurological:     Mental Status: He is alert and oriented to person, place, and time.     ED Results / Procedures / Treatments   Labs (all labs ordered are listed, but only abnormal results are displayed) Labs Reviewed  COMPREHENSIVE METABOLIC PANEL - Abnormal; Notable for the following components:      Result Value   Potassium 3.0 (*)     Glucose, Bld 146 (*)    Calcium 10.6 (*)    Total Protein 8.6 (*)    Albumin 5.1 (*)    AST 42 (*)    All other components within normal limits  CBC - Abnormal; Notable for the following components:   WBC 17.9 (*)    RBC 3.56 (*)    Hemoglobin 12.3 (*)    HCT 34.2 (*)    MCH 34.6 (*)    All other components within normal limits  LIPASE, BLOOD  URINALYSIS, ROUTINE W REFLEX MICROSCOPIC    EKG None  Radiology CT Renal Stone Study  Result Date: 11/07/2022 CLINICAL DATA:  Abdominal/flank pain, stone suspected. EXAM: CT ABDOMEN AND PELVIS WITHOUT CONTRAST TECHNIQUE: Multidetector CT imaging of the abdomen and pelvis was performed following the standard protocol without IV contrast. RADIATION DOSE REDUCTION: This exam was performed according to the departmental dose-optimization program which includes automated exposure control, adjustment of the mA and/or kV according to patient size and/or use of iterative reconstruction technique. COMPARISON:  CT abdomen/pelvis 01/02/2022. FINDINGS: Lower chest: No acute abnormality. Hepatobiliary: No focal liver abnormality is seen. No gallstones, gallbladder wall thickening, or biliary dilatation. Pancreas: Unremarkable. No pancreatic ductal dilatation or surrounding inflammatory changes. Spleen: Normal in size without focal abnormality. Adrenals/Urinary Tract: Adrenal glands are unremarkable. Kidneys are normal, without renal calculi, focal lesion, or hydronephrosis. Bladder is unremarkable. Stomach/Bowel: Normal stomach and duodenum. No dilated loops of small bowel. Prior appendectomy. No bowel wall thickening. Vascular/Lymphatic: No significant vascular findings are present. No enlarged abdominal or pelvic lymph nodes. Reproductive: Prostate is unremarkable. Other: No abdominal wall hernia or abnormality. No abdominopelvic ascites. Musculoskeletal: No acute or significant osseous findings. IMPRESSION: No acute abdominopelvic abnormality. Specifically, no  evidence of nephrolithiasis or hydronephrosis. Electronically Signed   By: Orvan Falconer M.D.   On: 11/07/2022 11:40    Procedures Procedures    Medications Ordered in ED Medications  lidocaine (LIDODERM) 5 % 1 patch (1 patch Transdermal Patch Applied 11/07/22 1352)  metoCLOPramide (REGLAN) 20 mg in dextrose 5 % 50 mL IVPB (has no administration in time range)  diphenhydrAMINE (BENADRYL) injection 25 mg (has no administration in time range)  ondansetron (ZOFRAN) injection 4 mg (4 mg Intravenous Given 11/07/22 1036)  ketorolac (TORADOL) 15 MG/ML injection 15 mg (15 mg Intravenous Given 11/07/22 1036)  diazepam (VALIUM) injection 5 mg (5 mg Intravenous Given 11/07/22 1207)  potassium chloride (KLOR-CON) packet 40 mEq (40 mEq Oral Given 11/07/22 1352)  LORazepam (ATIVAN) injection 1 mg (1 mg Intravenous Given 11/07/22 1434)    ED Course/ Medical Decision Making/ A&P  Medical Decision Making Amount and/or Complexity of Data Reviewed Labs: ordered. Radiology: ordered.  Risk Prescription drug management.   This patient presents to the ED for concern of flank pain, this involves a number of treatment options, and is a complaint that carries with it a high risk of complications and morbidity.  The differential diagnosis includes renal colic, cholecystitis, versus pancreatitis.    Co morbidities: Discussed in HPI   Brief History:  See HPI.   EMR reviewed including pt PMHx, past surgical history and past visits to ER.   See HPI for more details   Lab Tests:  I ordered and independently interpreted labs.  The pertinent results include:    CBC with leukocytosis of 17.9. significantly elevated from his prior records. Hemoglobin is within normal limits. CMP with remarkable hypokalemia, will supplement this via IV.  Creatinine levels within normal limits.  LFTs unremarkable.  Lipase levels normal.   Imaging Studies:  CT Renal showed    Medicines  ordered:  I ordered medication including morphine, zofran  for pain control Reevaluation of the patient after these medicines showed that the patient stayed the same I have reviewed the patients home medicines and have made adjustments as needed  Reevaluation:  After the interventions noted above I re-evaluated patient and found that they have :improved   Social Determinants of Health:  The patient's social determinants of health were a factor in the care of this patient  Problem List / ED Course:  Patient presents to the ED with bilateral flank pain which began yesterday.  Patient also attributes that he is withdrawing from fentanyl.  He reports he has a history of kidney stones, this feels very similar to it, has had some nausea along with vomiting.  Of note, his chart review patient with a prior history of gastroparesis.  On primary evaluation, patient is rolling around on the floor, he has his legs above his head.  He is complaining of pain on his back.  His white blood cell count is 17 today, reports he has been vomiting throughout the night.  CMP with no electrolyte derangement aside from some hypokalemia, attempted to give IV potassium, however patient would not stay still for a potassium infusion.  LFTs remarkable for slight elevation of his AST, does have a prior history of polysubstance abuse.  Lipase level is normal.  CT renal stone showed:  No acute abdominopelvic abnormality. Specifically, no evidence of  nephrolithiasis or hydronephrosis.    Dispostion:  Patient continues to to have vomiting episodes, given Benadryl along with Reglan, will need reassessment and if he cannot keep anything down will need admission for non-intractable vomiting.  If he is able to tolerate p.o. he will go home.  Portions of this note were generated with Scientist, clinical (histocompatibility and immunogenetics). Dictation errors may occur despite best attempts at proofreading.   Final Clinical Impression(s) / ED Diagnoses Final  diagnoses:  Generalized abdominal pain  Nausea and vomiting, unspecified vomiting type    Rx / DC Orders ED Discharge Orders     None         Claude Manges, PA-C 11/07/22 1534    Mardene Sayer, MD 11/07/22 1720

## 2022-11-07 NOTE — ED Notes (Signed)
2 unsuccessful IV attempts by this RN 

## 2022-11-07 NOTE — ED Notes (Signed)
The pt is asking for pain med 

## 2022-11-07 NOTE — ED Triage Notes (Signed)
Pt here with flank pain, lower abdominal pain and back pain along with nausea. Symptom onset yesterday. Pt unable to sit still in triage, appears uncomfortable.

## 2022-11-07 NOTE — ED Provider Notes (Addendum)
Patient signed out to reassess.   Pt ambulatory back and forth to bathroom, steady gait.   No tremor or shakes. No recurrent emesis.   Has received ivf, pain meds/nausea meds and appears improved.   In careeverywhere multiple prior uds +THC ?possible cannabinoid hyperemesis syndrome.   Currently improved from prior. Bp 142/100, p 88, rr 16.   Pt currently appears stable for d/c.  Rec pcp f/u.  Pt acknowledges hx sud, will provide resources/resource guide and is encouraged to f/u as outpatient. Also to f/u pcp her routine care and htn management. Additional sud and bh resources provided.   Return precautions provided.      Cathren Laine, MD 11/07/22 [phone]

## 2022-11-07 NOTE — ED Notes (Signed)
The pt who said he could not walk is now in the br  I arrived to give him med

## 2022-11-07 NOTE — ED Notes (Signed)
Pt ambulating in hall and states he is in pain. Pt escorted back to room. Pt lying on floor mat and states the bed is not comfortable. EDP notified of pt's pain and to come to bedside for pt evaluation. Pt updated on plan of care.

## 2022-12-12 ENCOUNTER — Other Ambulatory Visit: Payer: Self-pay

## 2022-12-12 ENCOUNTER — Emergency Department (HOSPITAL_COMMUNITY)
Admission: EM | Admit: 2022-12-12 | Discharge: 2022-12-13 | Disposition: A | Discharge status: Home / Self Care | Payer: Self-pay | Attending: Emergency Medicine | Admitting: Emergency Medicine

## 2022-12-12 ENCOUNTER — Encounter (HOSPITAL_COMMUNITY): Payer: Self-pay | Admitting: Emergency Medicine

## 2022-12-12 DIAGNOSIS — K429 Umbilical hernia without obstruction or gangrene: Secondary | ICD-10-CM | POA: Insufficient documentation

## 2022-12-12 DIAGNOSIS — R1084 Generalized abdominal pain: Secondary | ICD-10-CM

## 2022-12-12 DIAGNOSIS — Z91040 Latex allergy status: Secondary | ICD-10-CM | POA: Insufficient documentation

## 2022-12-12 LAB — COMPREHENSIVE METABOLIC PANEL
ALT: 17 U/L (ref 0–44)
AST: 20 U/L (ref 15–41)
Albumin: 3.8 g/dL (ref 3.5–5.0)
Alkaline Phosphatase: 61 U/L (ref 38–126)
Anion gap: 9 (ref 5–15)
BUN: 13 mg/dL (ref 6–20)
CO2: 25 mmol/L (ref 22–32)
Calcium: 9 mg/dL (ref 8.9–10.3)
Chloride: 103 mmol/L (ref 98–111)
Creatinine, Ser: 0.85 mg/dL (ref 0.61–1.24)
GFR, Estimated: >60 mL/min (ref >60)
Glucose, Bld: 113 mg/dL — ABNORMAL HIGH (ref 70–99)
Potassium: 3.5 mmol/L (ref 3.5–5.1)
Sodium: 137 mmol/L (ref 135–145)
Total Bilirubin: 0.6 mg/dL (ref 0.3–1.2)
Total Protein: 7 g/dL (ref 6.5–8.1)

## 2022-12-12 LAB — CBC
HCT: 36.7 % — ABNORMAL LOW (ref 39.0–52.0)
Hemoglobin: 12.4 g/dL — ABNORMAL LOW (ref 13.0–17.0)
MCH: 34.7 pg — ABNORMAL HIGH (ref 26.0–34.0)
MCHC: 33.8 g/dL (ref 30.0–36.0)
MCV: 102.8 fL — ABNORMAL HIGH (ref 80.0–100.0)
Platelets: 205 10*3/uL (ref 150–400)
RBC: 3.57 MIL/uL — ABNORMAL LOW (ref 4.22–5.81)
RDW: 13.3 % (ref 11.5–15.5)
WBC: 7.6 10*3/uL (ref 4.0–10.5)
nRBC: 0 % (ref 0.0–0.2)

## 2022-12-12 LAB — LIPASE, BLOOD: Lipase: 95 U/L — ABNORMAL HIGH (ref 11–51)

## 2022-12-12 NOTE — ED Triage Notes (Signed)
Pt c/o abdominal pain x 3 days. Pt was initially constipated but had a BM this morning after taking stool softeners. Denies N/V/D

## 2022-12-13 ENCOUNTER — Emergency Department (HOSPITAL_COMMUNITY): Payer: Self-pay

## 2022-12-13 MED ORDER — PANTOPRAZOLE SODIUM 40 MG IV SOLR
40.0000 mg | Freq: Once | INTRAVENOUS | Status: AC
  Administered 2022-12-13: 40 mg via INTRAVENOUS
  Filled 2022-12-13: qty 10

## 2022-12-13 MED ORDER — IOHEXOL 350 MG/ML SOLN
75.0000 mL | Freq: Once | INTRAVENOUS | Status: AC | PRN
  Administered 2022-12-13: 75 mL via INTRAVENOUS

## 2022-12-13 MED ORDER — ONDANSETRON HCL 4 MG/2ML IJ SOLN
4.0000 mg | Freq: Once | INTRAMUSCULAR | Status: AC
  Administered 2022-12-13: 4 mg via INTRAVENOUS
  Filled 2022-12-13: qty 2

## 2022-12-13 MED ORDER — FENTANYL CITRATE PF 50 MCG/ML IJ SOSY
50.0000 ug | PREFILLED_SYRINGE | Freq: Once | INTRAMUSCULAR | Status: AC
  Administered 2022-12-13: 50 ug via INTRAVENOUS
  Filled 2022-12-13: qty 1

## 2022-12-13 MED ORDER — SODIUM CHLORIDE 0.9 % IV BOLUS
1000.0000 mL | Freq: Once | INTRAVENOUS | Status: AC
  Administered 2022-12-13: 1000 mL via INTRAVENOUS

## 2022-12-13 MED ORDER — FAMOTIDINE IN NACL 20-0.9 MG/50ML-% IV SOLN
20.0000 mg | Freq: Once | INTRAVENOUS | Status: AC
  Administered 2022-12-13: 20 mg via INTRAVENOUS
  Filled 2022-12-13: qty 50

## 2022-12-13 NOTE — ED Provider Notes (Signed)
[place] EMERGENCY DEPARTMENT AT [facility] Provider Note   CSN: [id number] Arrival date & time: 12/12/22  2152     History  Chief Complaint  Patient presents with   Abdominal Pain    Erik Cowan is a 37 y.o. male.  Patient presents to the emergency department complaining of 3 days of abdominal pain.  He complains of epigastric pain and also complains of possible pain due to a hernia in the ventral region of his abdomen.  Patient states he has been dealing with some constipation but had a bowel movement this morning after taking stool softeners.  He endorses nausea, vomiting but denies diarrhea.  He states his last episode of emesis was early Sunday morning.  Patient endorses history of GERD, reports ulcers as well even though I see no history of same.  Past medical history also significant for cyclic vomiting syndrome, cannabis use disorder, bipolar 1 disorder, polysubstance abuse, schizophrenia  HPI     Home Medications Prior to Admission medications   Medication Sig Start Date End Date Taking? Authorizing Provider  amLODipine (NORVASC) 5 MG tablet Take 1 tablet (5 mg total) by mouth daily. 02/03/22   Gailen Shelter, PA  baclofen 5 MG TABS Take 5 mg by mouth 3 (three) times daily as needed for muscle spasms (hiccups). 01/29/22   Meredeth Ide, MD  diphenhydrAMINE (BENADRYL) 25 MG tablet Take 1 tablet (25 mg total) by mouth every 6 (six) hours as needed. 02/03/22   Gailen Shelter, PA  metoCLOPramide (REGLAN) 10 MG tablet Take 1 tablet (10 mg total) by mouth every 6 (six) hours as needed for nausea. 02/03/22   Fondaw, Rodrigo Ran, PA  OLANZapine zydis (ZYPREXA) 15 MG disintegrating tablet Take 0.5 tablets (7.5 mg total) by mouth at bedtime. 02/03/22   Fondaw, Rodrigo Ran, PA  ondansetron (ZOFRAN-ODT) 8 MG disintegrating tablet Take 1 tablet (8 mg total) by mouth every 8 (eight) hours as needed for nausea or vomiting. 11/07/22   Cathren Laine, MD  pantoprazole (PROTONIX) 40 MG tablet  Take 1 tablet (40 mg total) by mouth 2 (two) times daily. 01/29/22   Meredeth Ide, MD  potassium chloride SA (KLOR-CON M) 20 MEQ tablet One po bid x 3 days, then one po once a day 11/07/22   Cathren Laine, MD      Allergies    Gluten meal, Haldol [haloperidol], Milk-related compounds, and Latex    Review of Systems   Review of Systems  Physical Exam Updated Vital Signs BP 127/75 (BP Location: Right Arm)   Pulse 85   Temp 98.3 F (36.8 C) (Oral)   Resp 18   Ht 6\' 2"  (1.88 m)   Wt 65.8 kg   SpO2 100%   BMI 18.62 kg/m  Physical Exam Vitals and nursing note reviewed.  Constitutional:      General: He is not in acute distress.    Appearance: He is well-developed.  HENT:     Head: Normocephalic and atraumatic.  Eyes:     Conjunctiva/sclera: Conjunctivae normal.  Cardiovascular:     Rate and Rhythm: Normal rate and regular rhythm.     Heart sounds: No murmur heard. Pulmonary:     Effort: Pulmonary effort is normal. No respiratory distress.     Breath sounds: Normal breath sounds.  Abdominal:     Palpations: Abdomen is soft.     Tenderness: There is abdominal tenderness in the epigastric area and periumbilical area.     Hernia:  A hernia is present. Hernia is present in the umbilical area (Reducible).  Musculoskeletal:        General: No swelling.     Cervical back: Neck supple.  Skin:    General: Skin is warm and dry.     Capillary Refill: Capillary refill takes less than 2 seconds.  Neurological:     Mental Status: He is alert.  Psychiatric:        Mood and Affect: Mood normal.     ED Results / Procedures / Treatments   Labs (all labs ordered are listed, but only abnormal results are displayed) Labs Reviewed  LIPASE, BLOOD - Abnormal; Notable for the following components:      Result Value   Lipase 95 (*)    All other components within normal limits  COMPREHENSIVE METABOLIC PANEL - Abnormal; Notable for the following components:   Glucose, Bld 113 (*)    All  other components within normal limits  CBC - Abnormal; Notable for the following components:   RBC 3.57 (*)    Hemoglobin 12.4 (*)    HCT 36.7 (*)    MCV 102.8 (*)    MCH 34.7 (*)    All other components within normal limits  URINALYSIS, ROUTINE W REFLEX MICROSCOPIC    EKG None  Radiology CT ABDOMEN PELVIS W CONTRAST  Result Date: 12/13/2022 CLINICAL DATA:  Acute abdominal pain EXAM: CT ABDOMEN AND PELVIS WITH CONTRAST TECHNIQUE: Multidetector CT imaging of the abdomen and pelvis was performed using the standard protocol following bolus administration of intravenous contrast. RADIATION DOSE REDUCTION: This exam was performed according to the departmental dose-optimization program which includes automated exposure control, adjustment of the mA and/or kV according to patient size and/or use of iterative reconstruction technique. CONTRAST:  75mL OMNIPAQUE IOHEXOL 350 MG/ML SOLN COMPARISON:  None Available. FINDINGS: Lower chest: No acute abnormality. Hepatobiliary: No focal liver abnormality is seen. No gallstones, gallbladder wall thickening, or biliary dilatation. Pancreas: Unremarkable. No pancreatic ductal dilatation or surrounding inflammatory changes. Spleen: Normal in size without focal abnormality. Adrenals/Urinary Tract: Adrenal glands are unremarkable. Kidneys are normal, without renal calculi, focal lesion, or hydronephrosis. Bladder is unremarkable. Stomach/Bowel: Stomach is within normal limits. Appendix is not seen. No evidence of bowel wall thickening, distention, or inflammatory changes. Vascular/Lymphatic: No significant vascular findings are present. No enlarged abdominal or pelvic lymph nodes. Reproductive: Prostate is unremarkable. Other: There is a trace amount of free fluid in the pelvis. There is a small fat containing umbilical hernia. Musculoskeletal: No acute or significant osseous findings. IMPRESSION: 1. Trace amount of free fluid in the pelvis, of uncertain etiology. 2.  Small fat containing umbilical hernia. Electronically Signed   By: Darliss Cheney M.D.   On: 12/13/2022 02:29    Procedures Hernia reduction  Date/Time: 12/13/2022 1:22 AM  Performed by: Darrick Grinder, PA-C Authorized by: Darrick Grinder, PA-C  Consent: Verbal consent obtained. Consent given by: patient Patient identity confirmed: verbally with patient Local anesthesia used: no  Anesthesia: Local anesthesia used: no  Sedation: Patient sedated: no  Patient tolerance: patient tolerated the procedure well with no immediate complications Comments: Ventral hernia reduced with gentle pressure       Medications Ordered in ED Medications  fentaNYL (SUBLIMAZE) injection 50 mcg (has no administration in time range)  famotidine (PEPCID) IVPB 20 mg premix (0 mg Intravenous Stopped 12/13/22 0222)  pantoprazole (PROTONIX) injection 40 mg (40 mg Intravenous Given 12/13/22 0127)  ondansetron (ZOFRAN) injection 4 mg (4 mg Intravenous  Given 12/13/22 0126)  sodium chloride 0.9 % bolus 1,000 mL (0 mLs Intravenous Stopped 12/13/22 0311)  fentaNYL (SUBLIMAZE) injection 50 mcg (50 mcg Intravenous Given 12/13/22 0206)  iohexol (OMNIPAQUE) 350 MG/ML injection 75 mL (75 mLs Intravenous Contrast Given 12/13/22 0203)    ED Course/ Medical Decision Making/ A&P                             Medical Decision Making Amount and/or Complexity of Data Reviewed Labs: ordered. Radiology: ordered.  Risk Prescription drug management.   This patient presents to the ED for concern of abdominal pain, this involves an extensive number of treatment options, and is a complaint that carries with it a high risk of complications and morbidity.  The differential diagnosis includes pancreatitis, appendicitis, cholecystitis, gastritis, others   Co morbidities that complicate the patient evaluation  History of epigastric pain, GERD   Additional history obtained:   External records from outside source obtained  and reviewed including ED notes for abdominal pain in early June   Lab Tests:  I Ordered, and personally interpreted labs.  The pertinent results include: Lipase 95, CBC and CMP appear to be at baseline   Imaging Studies ordered:  I ordered imaging studies including CT abdomen pelvis with contrast I independently visualized and interpreted imaging which showed  1. Trace amount of free fluid in the pelvis, of uncertain etiology.  2. Small fat containing umbilical hernia.   I agree with the radiologist interpretation   Problem List / ED Course / Critical interventions / Medication management   I ordered medication including Pepcid, Protonix for abdominal pain, Zofran for nausea, fentanyl for pain Reevaluation of the patient after these medicines showed that the patient improved I have reviewed the patients home medicines and have made adjustments as needed   Social Determinants of Health:  Patient has no reported health insurance   Test / Admission - Considered:  Patient symptoms seem most consistent with gastritis.  No acute intra-abdominal abnormality to explain patient's pain.  Patient with history of GERD and reported ulcers even though I see no chart to document the same.  Plan to discharge home with recommendations for Pepcid and course of PPI.  The patient does have a reducible ventral hernia.  Will provide referral to general surgery.  I talked to the patient about the importance of establishing care with a primary care provider and the patient states he will call to try to schedule an appointment.  No indication at this time for admission.  Discharge home         Final Clinical Impression(s) / ED Diagnoses Final diagnoses:  Generalized abdominal pain  Umbilical hernia without obstruction and without gangrene    Rx / DC Orders ED Discharge Orders     None         Pamala Duffel 12/13/22 0402    Zadie Rhine, MD 12/13/22 [phone]

## 2022-12-13 NOTE — ED Notes (Signed)
Pt provided with AVS.  Education complete; all questions answered.  Pt leaving ED in stable condition at this time, ambulatory with all belongings. 

## 2022-12-13 NOTE — ED Notes (Signed)
 Pt back from CT at this time 

## 2022-12-13 NOTE — Discharge Instructions (Addendum)
You were evaluated today for abdominal pain. Your workup was reassuring. I recommend trying pepcid and nexium for symptom relief. Please make an appointment with a primary care provider for further evaluation and management.  You were also evaluated for a hernia. Please contact the listed surgical office to schedule an appointment for evaluation.  If you develop any life threatening symptoms please return to the emergency department.

## 2023-03-16 ENCOUNTER — Other Ambulatory Visit: Payer: Self-pay

## 2023-03-16 ENCOUNTER — Emergency Department (HOSPITAL_COMMUNITY)
Admission: EM | Admit: 2023-03-16 | Discharge: 2023-03-17 | Discharge status: Against Medical Advice | Payer: 59 | Attending: Emergency Medicine | Admitting: Emergency Medicine

## 2023-03-16 DIAGNOSIS — R197 Diarrhea, unspecified: Secondary | ICD-10-CM | POA: Insufficient documentation

## 2023-03-16 DIAGNOSIS — R1030 Lower abdominal pain, unspecified: Secondary | ICD-10-CM | POA: Insufficient documentation

## 2023-03-16 DIAGNOSIS — R1110 Vomiting, unspecified: Secondary | ICD-10-CM | POA: Insufficient documentation

## 2023-03-16 LAB — COMPREHENSIVE METABOLIC PANEL
ALT: 13 U/L (ref 0–44)
AST: 22 U/L (ref 15–41)
Albumin: 4.3 g/dL (ref 3.5–5.0)
Alkaline Phosphatase: 72 U/L (ref 38–126)
Anion gap: 13 (ref 5–15)
BUN: 11 mg/dL (ref 6–20)
CO2: 24 mmol/L (ref 22–32)
Calcium: 9.6 mg/dL (ref 8.9–10.3)
Chloride: 101 mmol/L (ref 98–111)
Creatinine, Ser: 1.17 mg/dL (ref 0.61–1.24)
GFR, Estimated: >60 mL/min (ref >60)
Glucose, Bld: 107 mg/dL — ABNORMAL HIGH (ref 70–99)
Potassium: 3.1 mmol/L — ABNORMAL LOW (ref 3.5–5.1)
Sodium: 138 mmol/L (ref 135–145)
Total Bilirubin: 0.7 mg/dL (ref 0.3–1.2)
Total Protein: 7.3 g/dL (ref 6.5–8.1)

## 2023-03-16 LAB — CBC
HCT: 39.4 % (ref 39.0–52.0)
Hemoglobin: 13.6 g/dL (ref 13.0–17.0)
MCH: 33.2 pg (ref 26.0–34.0)
MCHC: 34.5 g/dL (ref 30.0–36.0)
MCV: 96.1 fL (ref 80.0–100.0)
Platelets: 163 10*3/uL (ref 150–400)
RBC: 4.1 MIL/uL — ABNORMAL LOW (ref 4.22–5.81)
RDW: 12.3 % (ref 11.5–15.5)
WBC: 5.8 10*3/uL (ref 4.0–10.5)
nRBC: 0 % (ref 0.0–0.2)

## 2023-03-16 LAB — LIPASE, BLOOD: Lipase: 21 U/L (ref 11–51)

## 2023-03-16 NOTE — ED Triage Notes (Signed)
Patient reports pain across lower abdomen with emesis and diarrhea onset last week , no fever or chills .

## 2023-03-17 NOTE — ED Notes (Signed)
Called pt multiple times, no response ?

## 2023-05-19 DIAGNOSIS — Z5948 Other specified lack of adequate food: Secondary | ICD-10-CM | POA: Diagnosis not present

## 2023-05-19 DIAGNOSIS — F324 Major depressive disorder, single episode, in partial remission: Secondary | ICD-10-CM | POA: Diagnosis not present

## 2023-05-19 DIAGNOSIS — R011 Cardiac murmur, unspecified: Secondary | ICD-10-CM | POA: Diagnosis not present

## 2023-05-19 DIAGNOSIS — R030 Elevated blood-pressure reading, without diagnosis of hypertension: Secondary | ICD-10-CM | POA: Diagnosis not present

## 2023-05-19 DIAGNOSIS — Z809 Family history of malignant neoplasm, unspecified: Secondary | ICD-10-CM | POA: Diagnosis not present

## 2023-05-19 DIAGNOSIS — Z5986 Financial insecurity: Secondary | ICD-10-CM | POA: Diagnosis not present

## 2023-07-13 ENCOUNTER — Emergency Department (HOSPITAL_COMMUNITY)
Admission: EM | Admit: 2023-07-13 | Discharge: 2023-07-14 | Disposition: A | Discharge status: Home / Self Care | Payer: 59 | Attending: Emergency Medicine | Admitting: Emergency Medicine

## 2023-07-13 ENCOUNTER — Other Ambulatory Visit: Payer: Self-pay

## 2023-07-13 ENCOUNTER — Encounter (HOSPITAL_COMMUNITY): Payer: Self-pay | Admitting: *Deleted

## 2023-07-13 DIAGNOSIS — F319 Bipolar disorder, unspecified: Secondary | ICD-10-CM | POA: Diagnosis not present

## 2023-07-13 DIAGNOSIS — F1914 Other psychoactive substance abuse with psychoactive substance-induced mood disorder: Secondary | ICD-10-CM | POA: Insufficient documentation

## 2023-07-13 DIAGNOSIS — Z5900 Homelessness unspecified: Secondary | ICD-10-CM | POA: Diagnosis not present

## 2023-07-13 DIAGNOSIS — R45851 Suicidal ideations: Secondary | ICD-10-CM | POA: Insufficient documentation

## 2023-07-13 LAB — CBC
HCT: 42.9 % (ref 39.0–52.0)
Hemoglobin: 14.6 g/dL (ref 13.0–17.0)
MCH: 33.8 pg (ref 26.0–34.0)
MCHC: 34 g/dL (ref 30.0–36.0)
MCV: 99.3 fL (ref 80.0–100.0)
Platelets: 150 10*3/uL (ref 150–400)
RBC: 4.32 MIL/uL (ref 4.22–5.81)
RDW: 12.6 % (ref 11.5–15.5)
WBC: 5 10*3/uL (ref 4.0–10.5)
nRBC: 0 % (ref 0.0–0.2)

## 2023-07-13 LAB — RAPID URINE DRUG SCREEN, HOSP PERFORMED
Amphetamines: NOT DETECTED
Barbiturates: NOT DETECTED
Benzodiazepines: NOT DETECTED
Cocaine: NOT DETECTED
Opiates: NOT DETECTED
Tetrahydrocannabinol: POSITIVE — AB

## 2023-07-13 LAB — COMPREHENSIVE METABOLIC PANEL
ALT: 13 U/L (ref 0–44)
AST: 24 U/L (ref 15–41)
Albumin: 4.2 g/dL (ref 3.5–5.0)
Alkaline Phosphatase: 73 U/L (ref 38–126)
Anion gap: 11 (ref 5–15)
BUN: 12 mg/dL (ref 6–20)
CO2: 24 mmol/L (ref 22–32)
Calcium: 9.2 mg/dL (ref 8.9–10.3)
Chloride: 101 mmol/L (ref 98–111)
Creatinine, Ser: 1.08 mg/dL (ref 0.61–1.24)
GFR, Estimated: >60 mL/min (ref >60)
Glucose, Bld: 89 mg/dL (ref 70–99)
Potassium: 3.8 mmol/L (ref 3.5–5.1)
Sodium: 136 mmol/L (ref 135–145)
Total Bilirubin: 0.9 mg/dL (ref 0.0–1.2)
Total Protein: 7.2 g/dL (ref 6.5–8.1)

## 2023-07-13 LAB — SALICYLATE LEVEL: Salicylate Lvl: <7 mg/dL — ABNORMAL LOW (ref 7.0–30.0)

## 2023-07-13 LAB — ETHANOL: Alcohol, Ethyl (B): <10 mg/dL (ref <10)

## 2023-07-13 LAB — ACETAMINOPHEN LEVEL: Acetaminophen (Tylenol), Serum: <10 ug/mL — ABNORMAL LOW (ref 10–30)

## 2023-07-13 MED ORDER — LORAZEPAM 1 MG PO TABS
2.0000 mg | ORAL_TABLET | Freq: Once | ORAL | Status: AC
Start: 2023-07-13 — End: 2023-07-13
  Administered 2023-07-13: 2 mg via ORAL
  Filled 2023-07-13: qty 2

## 2023-07-13 MED ORDER — OLANZAPINE 7.5 MG PO TABS: 7.5000 mg | ORAL_TABLET | Freq: Every day | ORAL | 0 refills | Status: AC

## 2023-07-13 MED ORDER — OLANZAPINE 5 MG PO TABS
7.5000 mg | ORAL_TABLET | Freq: Once | ORAL | Status: AC
  Administered 2023-07-14: 7.5 mg via ORAL
  Filled 2023-07-13: qty 1

## 2023-07-13 NOTE — Consult Note (Signed)
[facility] Consult Note  Patient Name: Erik Cowan MRN: [id number] DOB: 31-Oct-1985 DATE OF Consult: 07/13/2023  PRIMARY PSYCHIATRIC DIAGNOSES Unspecified bipolar and related disorder; Substance induced depressive disorder; Polysubstance use disorder by history; Homelessness.   Based on my current evaluation and assessment of the patient, he is a 38 y.o. male who presents with complaints of insomnia, suicidal ideations, and internal preoccupation in the context of psychosocial stressors, lack of engagement with psychiatric services and substance use. Patient denies suicidal and homicidal intent and is future oriented to engage in outpatient mental health services. The patient's presentation is consistent with Unspecified bipolar and related disorder; Substance induced depressive disorder; Polysubstance use disorder by history; Homelessness. Therefore, patient does not meet criteria for an intensive inpatient psychiatric hospitalization.  RECOMMENDATIONS   Medication recommendations:  Risks, benefits, side effects and alternatives to treatments reviewed, patient provided consent to the following scheduled medication changes:  -Restart olanzapine 7.5 mg at bedtime for mood instability and thought disorganization. Provide one dose prior to discharge from ED as patient is getting a ride home. Would not provide patient with more than a 15 day supply to encourage adherence with outpatient follow up. Side effects to patient include: dizziness, lightheadedness, drowsiness, nausea, vomiting, tiredness, excess saliva/drooling, blurred vision, weight gain, constipation, headache, restlessness (especially in the legs), shaking (tremor), muscle spasm, mask-like expression of the face, unusual uncontrolled movements called tardive dyskinesia (these uncontrolled movements are often of the face, mouth, tongue, arms, or legs), and trouble sleeping may occur. Note the following serious side effects: fainting,  suicidal thoughts, trouble swallowing, and seizures, warranting immediate discontinuation of this medication.  Non-Medication recommendations:  -Recommend resources for patient to establish with outpatient chemical dependency treatment  -Recommend resources to establish with outpatient psychotherapy to develop more appropriate coping and to process his thoughts and emotions  -Recommend resources for patient to establish with psychiatric provider for psychotropic medication management -Recommend regular follow-up with primary care provider; consider outpatient workup for mood dysregulation to include vitamin B12, thyroid and vitamin D studies to name a few -Safety planning with strict return precautions to the ED if patient is at imminent risk to self or others in the future   Observation recommendations:  If agitated, recommend 1:1 observation   Is inpatient psychiatric hospitalization recommended for this patient? No (Explain why): patient is not at imminent risk to self or others  Follow-Up Telepsychiatry C/L services: We will sign off for now. Please re-consult our service if needed for any concerning changes in the patient's condition, discharge planning, or questions.  Communication: Treatment team members (and family members if applicable) who were involved in treatment/care discussions and planning, and with whom we spoke or engaged with via secure text/chat, include the following: primary team  Thank you for involving [place] in the care of this patient. If you have any additional questions or concerns, please call [phone] and ask for me or the provider on-call.  Total time spent in this encounter was 60 minutes with greater than 50% of time spent in counseling and coordination of care.  TELEPSYCHIATRY ATTESTATION & CONSENT  As the provider for this telehealth consult, I attest that I verified the patient's identity using two separate identifiers, introduced myself to the patient,  provided my credentials, disclosed my location, and performed this encounter via a HIPAA-compliant, real-time, face-to-face, two-way, interactive audio and video platform and with the full consent and agreement of the patient (or guardian as applicable.)  Patient physical location: 042C/042C. Telehealth provider physical location:  home office in state of [place].  Video start time: 2200 (Central Time) Video end time: 2220 (Central Time)  IDENTIFYING DATA  Erik Cowan is a 38 y.o. year-old male for whom a psychiatric consultation has been ordered by the primary provider. The patient was identified using two separate identifiers.  CHIEF COMPLAINT/REASON FOR CONSULT  Suicidal ideations  HISTORY OF PRESENT ILLNESS (HPI)  I evaluated the patient today face-to-face via secure, HIPAA-compliant telepsychiatric connection, and at the request of the primary treatment team. The reason for the telepsychiatric consultation is that the patient is a 38 year old male with a documented history of bipolar disorder type I, polysubstance use disorder, and malnutrition who presents for psychiatric evaluation given suicidal ideations with plan. Primary team is seeking psychotropic medication recommendations, safety evaluation to determine appropriateness for more intensive psychiatric services and diagnostic clarity as to the patient's presentation.   During one-on-one evaluation with this provider, patient was alert and oriented to self and to location and situation. The patient did not appear to be overtly inappropriately internally preoccupied; patient's thought process was linear and organized. Patient asserted that he was recently "kicked out" of his home and "disowned" by his family. Patient has been staying with his brother. He was told by brother that patient has been speaking to himself. Patient does not recall engaging in such behaviors. Patient related that he is experiencing suicidal ideations given his  psychosocial stressors; however, he wishes to re-establish with outpatient mental health services and psychotropic medications so that he can again function, hold a job, and be able to support his family. Patient denies suicidal and homicidal intent; he is future oriented to engage in outpatient mental health services.   PAST PSYCHIATRIC HISTORY  Inpatient psychiatric treatment: per patient, yes  Outpatient mental health treatment: per patient, denies  Current home psychotropic medications: per patient, denies Suicide attempts: per patient, denies  Suicidal gestures: per patient, he was walking along a bridge contemplating whether to jump but made no attempt to do so. Before long a family member saw him walking and took him into their vehicle  Trauma history: patient did not assert further concerns for trauma/exploitation  Otherwise as per HPI above.  PAST MEDICAL HISTORY  Past Medical History:  Diagnosis Date   Bipolar 1 disorder (HCC)    Bipolar disorder (HCC)    Cyclic vomiting syndrome    GERD (gastroesophageal reflux disease)    Nephrolithiasis    Schizophrenia, acute (HCC)      HOME MEDICATIONS  Facility Ordered Medications  Medication   [COMPLETED] LORazepam (ATIVAN) tablet 2 mg   OLANZapine (ZYPREXA) tablet 7.5 mg   PTA Medications  Medication Sig   pantoprazole (PROTONIX) 40 MG tablet Take 1 tablet (40 mg total) by mouth 2 (two) times daily.   baclofen 5 MG TABS Take 5 mg by mouth 3 (three) times daily as needed for muscle spasms (hiccups).   amLODipine (NORVASC) 5 MG tablet Take 1 tablet (5 mg total) by mouth daily.   metoCLOPramide (REGLAN) 10 MG tablet Take 1 tablet (10 mg total) by mouth every 6 (six) hours as needed for nausea.   diphenhydrAMINE (BENADRYL) 25 MG tablet Take 1 tablet (25 mg total) by mouth every 6 (six) hours as needed.   OLANZapine zydis (ZYPREXA) 15 MG disintegrating tablet Take 0.5 tablets (7.5 mg total) by mouth at bedtime.   ondansetron (ZOFRAN-ODT)  8 MG disintegrating tablet Take 1 tablet (8 mg total) by mouth every 8 (eight) hours as needed  for nausea or vomiting.   potassium chloride SA (KLOR-CON M) 20 MEQ tablet One po bid x 3 days, then one po once a day    ALLERGIES  Allergies  Allergen Reactions   Gluten Meal Nausea And Vomiting    Stomach pains   Haldol [Haloperidol]     Makes me anxious    Milk-Related Compounds Nausea And Vomiting    Stomach upset   Latex Rash    SOCIAL & SUBSTANCE USE HISTORY  Social History   Socioeconomic History   Marital status: Single    Spouse name: Not on file   Number of children: Not on file   Years of education: Not on file   Highest education level: Not on file  Occupational History   Occupation: KITCHEN CREW    Employer: [employer]    Comment: [employer]  Tobacco Use   Smoking status: Every Day    Current packs/day: 0.00    Types: Cigarettes    Start date: 06/03/2006    Last attempt to quit: 06/04/2007    Years since quitting: 16.1   Smokeless tobacco: Never   Tobacco comments:    see illicit drug comment  Substance and Sexual Activity   Alcohol use: Yes    Comment: occasional-once per month   Drug use: Yes    Frequency: 7.0 times per week    Types: Marijuana    Comment: smokes marijuana daily   Sexual activity: Never  Other Topics Concern   Not on file  Social History Narrative   Single.  Lives with his Aunt. Works part-time as a Buyer, retail.   Social Drivers of Corporate investment banker Strain: Not on file  Food Insecurity: Not on file  Transportation Needs: Not on file  Physical Activity: Not on file  Stress: Not on file  Social Connections: Unknown (10/10/2021)   Received [facility]   Social Network    Social Network: Not on file   Social History   Tobacco Use  Smoking Status Every Day   Current packs/day: 0.00   Types: Cigarettes   Start date: 06/03/2006   Last attempt to quit: 06/04/2007   Years since quitting:  16.1  Smokeless Tobacco Never  Tobacco Comments   see illicit drug comment   Social History   Substance and Sexual Activity  Alcohol Use Yes   Comment: occasional-once per month   Social History   Substance and Sexual Activity  Drug Use Yes   Frequency: 7.0 times per week   Types: Marijuana   Comment: smokes marijuana daily      FAMILY HISTORY  Family History  Problem Relation Age of Onset   Liver cancer Mother    Colon cancer Maternal Uncle    Diabetes Maternal Aunt    Heart disease Paternal Grandfather    Testicular cancer Brother    Family Psychiatric History (if known):  none disclosed  MENTAL STATUS EXAM (MSE)  Mental Status Exam: General Appearance: Fairly Groomed  Orientation:  Full (Time, Place, and Person)  Memory:  Immediate;   Fair Recent;   Fair Remote;   Fair  Concentration:  Concentration: Fair and Attention Span: Fair  Recall:  Fair  Attention  Fair  Eye Contact:  Fair  Speech:  Clear and Coherent  Language:  Fair  Volume:  Normal  Mood: "struggling"  Affect:  Congruent  Thought Process:  Coherent  Thought Content:  Logical  Suicidal Thoughts:  Yes.  without  intent  Homicidal Thoughts:  No  Judgement:  Fair  Insight:  Fair  Psychomotor Activity:  Decreased  Akathisia:  No  Fund of Knowledge:  Fair    Assets:  Manufacturing systems engineer Desire for Improvement Social Support  Cognition:  WNL  ADL's:  Intact  AIMS (if indicated):       VITALS  Blood pressure 127/87, pulse 61, temperature 98.5 F (36.9 C), temperature source Oral, resp. rate 16, height 6\' 2"  (1.88 m), weight 65.8 kg, SpO2 98%.  LABS  Admission on 07/13/2023  Component Date Value Ref Range Status   Sodium 07/13/2023 136  135 - 145 mmol/L Final   Potassium 07/13/2023 3.8  3.5 - 5.1 mmol/L Final   Chloride 07/13/2023 101  98 - 111 mmol/L Final   CO2 07/13/2023 24  22 - 32 mmol/L Final   Glucose, Bld 07/13/2023 89  70 - 99 mg/dL Final   Glucose reference range applies only  to samples taken after fasting for at least 8 hours.   BUN 07/13/2023 12  6 - 20 mg/dL Final   Creatinine, Ser 07/13/2023 1.08  0.61 - 1.24 mg/dL Final   Calcium 96/08/5407 9.2  8.9 - 10.3 mg/dL Final   Total Protein 81/19/1478 7.2  6.5 - 8.1 g/dL Final   Albumin 29/56/2130 4.2  3.5 - 5.0 g/dL Final   AST 86/57/8469 24  15 - 41 U/L Final   ALT 07/13/2023 13  0 - 44 U/L Final   Alkaline Phosphatase 07/13/2023 73  38 - 126 U/L Final   Total Bilirubin 07/13/2023 0.9  0.0 - 1.2 mg/dL Final   GFR, Estimated 07/13/2023 >60  >60 mL/min Final   Comment: (NOTE) Calculated using the CKD-EPI Creatinine Equation (2021)    Anion gap 07/13/2023 11  5 - 15 Final   Performed at [facility], [address] [place], [place] [address]   Alcohol, Ethyl (B) 07/13/2023 <10  <10 mg/dL Final   Comment: (NOTE) Lowest detectable limit for serum alcohol is 10 mg/dL.  For medical purposes only. Performed at [facility], [address] [place], [place] [address]    Salicylate Lvl 07/13/2023 <7.0 (L)  7.0 - 30.0 mg/dL Final   Performed at [facility], [address] [place], [place] [address]   Acetaminophen (Tylenol), Serum 07/13/2023 <10 (L)  10 - 30 ug/mL Final   Comment: (NOTE) Therapeutic concentrations vary significantly. A range of 10-30 ug/mL  may be an effective concentration for many patients. However, some  are best treated at concentrations outside of this range. Acetaminophen concentrations >150 ug/mL at 4 hours after ingestion  and >50 ug/mL at 12 hours after ingestion are often associated with  toxic reactions.  Performed at [facility], [address], [place], [place] [address]    WBC 07/13/2023 5.0  4.0 - 10.5 K/uL Final   RBC 07/13/2023 4.32  4.22 - 5.81 MIL/uL Final   Hemoglobin 07/13/2023 14.6  13.0 - 17.0 g/dL Final   HCT 66/44/0347 42.9  39.0 - 52.0 % Final   MCV 07/13/2023 99.3  80.0 - 100.0 fL Final   MCH 07/13/2023 33.8  26.0 - 34.0 pg Final    MCHC 07/13/2023 34.0  30.0 - 36.0 g/dL Final   RDW 42/59/5638 12.6  11.5 - 15.5 % Final   Platelets 07/13/2023 150  150 - 400 K/uL Final   nRBC 07/13/2023 0.0  0.0 - 0.2 % Final   Performed at [facility], [address] [place], [place] [address]   Opiates 07/13/2023 NONE DETECTED  NONE DETECTED Final   Cocaine 07/13/2023 NONE DETECTED  NONE DETECTED Final   Benzodiazepines 07/13/2023 NONE DETECTED  NONE DETECTED Final   Amphetamines 07/13/2023 NONE DETECTED  NONE DETECTED Final   Tetrahydrocannabinol 07/13/2023 POSITIVE (A)  NONE DETECTED Final   Barbiturates 07/13/2023 NONE DETECTED  NONE DETECTED Final   Comment: (NOTE) DRUG SCREEN FOR MEDICAL PURPOSES ONLY.  IF CONFIRMATION IS NEEDED FOR ANY PURPOSE, NOTIFY LAB WITHIN 5 DAYS.  LOWEST DETECTABLE LIMITS FOR URINE DRUG SCREEN Drug Class                     Cutoff (ng/mL) Amphetamine and metabolites    1000 Barbiturate and metabolites    200 Benzodiazepine                 200 Opiates and metabolites        300 Cocaine and metabolites        300 THC                            50 Performed at [facility], [address] [place], [place] [address]     PSYCHIATRIC REVIEW OF SYSTEMS (ROS)  ROS: Notable for the following relevant positive findings: Review of Systems  Psychiatric/Behavioral:  Positive for depression, substance abuse and suicidal ideas. Negative for hallucinations and memory loss. The patient is nervous/anxious and has insomnia.     Additional findings:      Musculoskeletal: No abnormal movements observed      Gait & Station: Normal      Pain Screening: Denies      Nutrition & Dental Concerns: Weight loss/gain of 10 pounds or more in last 3 months  RISK FORMULATION/ASSESSMENT  Is the patient experiencing any suicidal or homicidal ideations: Yes       Explain if yes: patient with suicidal ideations; however, he denies suicidal and homicidal intent - he is future oriented to establish with outpatient  mental health services  Protective factors considered for safety management: Patient is not endorsing current suicidal and homicidal intent, future orientation, willingness to engage in mental health treatment, no history of suicide attempts  Risk factors/concerns considered for safety management:  Depression Substance abuse/dependence Physical illness/chronic pain Barriers to accessing treatment Male gender  Is there a safety management plan with the patient and treatment team to minimize risk factors and promote protective factors: Yes           Explain: Safety planning with strict return precautions to the ED if patient is at imminent risk to self or others in the future  Is crisis care placement or psychiatric hospitalization recommended: No     Based on my current evaluation and risk assessment, patient is determined at this time to be at:  Moderate Risk  *RISK ASSESSMENT Risk assessment is a dynamic process; it is possible that this patient's condition, and risk level, may change. This should be re-evaluated and managed over time as appropriate. Please re-consult psychiatric consult services if additional assistance is needed in terms of risk assessment and management. If your team decides to discharge this patient, please advise the patient how to best access emergency psychiatric services, or to call 911, if their condition worsens or they feel unsafe in any way.   Rodena Medin, MD Telepsychiatry Consult Services

## 2023-07-13 NOTE — ED Provider Triage Note (Signed)
Emergency Medicine Provider Triage Evaluation Note  Erik Cowan , a 38 y.o. male  was evaluated in triage.  Pt complains of SI, HI without plans, command auditory hallucinations.  Review of Systems  Positive: History of bipolar 1 and schizophrenia, command auditory hallucinations Negative: Visual hallucinations, fever, chills, headache  Physical Exam  BP 127/87 (BP Location: Left Arm)   Pulse 61   Temp 98.5 F (36.9 C) (Oral)   Resp 16   Ht 6\' 2"  (1.88 m)   Wt 65.8 kg   SpO2 98%   BMI 18.62 kg/m  Gen:   Awake, no distress   Resp:  Normal effort  MSK:   Moves extremities without difficulty  Other:    Medical Decision Making  Medically screening exam initiated at 4:43 PM.  Appropriate orders placed.  Briggs Fishbaugh was informed that the remainder of the evaluation will be completed by another provider, this initial triage assessment does not replace that evaluation, and the importance of remaining in the ED until their evaluation is complete.  Labs ordered   Dolphus Jenny, PA-C 07/13/23 1644

## 2023-07-13 NOTE — BH Assessment (Signed)
TTS consult will be completed by [facility]. [facility] Coordinator will communicate in established secure chat assessment time and provider name. Thanks

## 2023-07-13 NOTE — ED Provider Notes (Signed)
[place] EMERGENCY DEPARTMENT AT [facility] Provider Note   CSN: [id number] Arrival date & time: 07/13/23  1407     History Chief Complaint  Patient presents with   Psychiatric Evaluation    HPI Phil Laura is a 38 y.o. male presenting for chief complaint of suicidal ideation.  States that he has been struggling with his mental health due to financial strain, fights with family, other social concerns.  Denies fevers chills nausea vomiting shortness of breath.  Is otherwise ambulatory tolerating p.o. intake..   Patient's recorded medical, surgical, social, medication list and allergies were reviewed in the Snapshot window as part of the initial history.   Review of Systems   Review of Systems  Constitutional:  Negative for chills and fever.  HENT:  Negative for ear pain and sore throat.   Eyes:  Negative for pain and visual disturbance.  Respiratory:  Negative for cough and shortness of breath.   Cardiovascular:  Negative for chest pain and palpitations.  Gastrointestinal:  Negative for abdominal pain and vomiting.  Genitourinary:  Negative for dysuria and hematuria.  Musculoskeletal:  Negative for arthralgias and back pain.  Skin:  Negative for color change and rash.  Neurological:  Negative for seizures and syncope.  All other systems reviewed and are negative.   Physical Exam Updated Vital Signs BP 127/87 (BP Location: Left Arm)   Pulse 61   Temp 98.5 F (36.9 C) (Oral)   Resp 16   Ht 6\' 2"  (1.88 m)   Wt 65.8 kg   SpO2 98%   BMI 18.62 kg/m  Physical Exam Vitals and nursing note reviewed.  Constitutional:      General: He is not in acute distress.    Appearance: He is well-developed.  HENT:     Head: Normocephalic and atraumatic.  Eyes:     Conjunctiva/sclera: Conjunctivae normal.  Cardiovascular:     Rate and Rhythm: Normal rate and regular rhythm.     Heart sounds: No murmur heard. Pulmonary:     Effort: Pulmonary effort is normal. No  respiratory distress.     Breath sounds: Normal breath sounds.  Abdominal:     Palpations: Abdomen is soft.     Tenderness: There is no abdominal tenderness.  Musculoskeletal:        General: No swelling.     Cervical back: Neck supple.  Skin:    General: Skin is warm and dry.     Capillary Refill: Capillary refill takes less than 2 seconds.  Neurological:     Mental Status: He is alert.  Psychiatric:        Mood and Affect: Mood normal.      ED Course/ Medical Decision Making/ A&P    Procedures Procedures   Medications Ordered in ED Medications  LORazepam (ATIVAN) tablet 2 mg (2 mg Oral Given 07/13/23 1754)   Medical Decision Making:   Lamoyne Hessel is a 38 y.o. male who presented to the ED today for psychiatric evaluation.  Patient is endorsing SI with plan.  Patient does have a history of simiar for which they are on medications.  They are not compliant with their medications.  On my initial exam, the pt was linear in thought, appropriate in affect, and overall well-appearing.  Vital signs reviewed and reassuring.  Reviewed and confirmed nursing documentation for past medical history, family history, social history.      Initial Assessment:   This is most consistent with an acute life threatening illness. With  the patient's presentation of SI, patient warrants emergent psychiatric consultation.  Differential includes primary psychosis, substance-induced psychosis, mood disturbance.  Initial Plan:  Patient immediately placed into ED psychiatric hold protocol including suicide precautions, elopement precautions and vital sign monitoring.    Emergent behavioral health hold signed and notarized while awaiting psychiatric consultation due to threat to self or others.  Psychiatry consulted for further evaluation once patient medically cleared.  Medical screening evaluation ordered and reviewed with no obvious medical reason to postpone psychiatric evaluation.  Patient is  voluntary at this time.  No IVC.  May need to be reassessed if capacity is changing.     Final Assessment and Plan:   Patient is medically cleared for psychiatric disposition at this time.  Pending psychiatric evaluation for final disposition.  Patient placed in psychiatrical protocol at this time.     Clinical Impression: No diagnosis found.   Data Unavailable   Final Clinical Impression(s) / ED Diagnoses Final diagnoses:  None    Rx / DC Orders ED Discharge Orders     None         Glyn Ade, MD 07/13/23 [phone]

## 2023-07-13 NOTE — ED Notes (Signed)
Just assumed care of patient, patient is laying in bed with no sitter at bedside. Charge nurse made aware that patient does not a Comptroller. Was told to open the door and keep an eye on patient. Patient is a&O times 4. Patient is stable at the present time will continue to monitor for any changes.

## 2023-07-13 NOTE — ED Triage Notes (Signed)
The pt reports that he is si and hi  he ran out of psy meds 2 months ago and never refilled them    he has no plans

## 2023-07-14 MED ORDER — OLANZAPINE 7.5 MG PO TABS
7.5000 mg | ORAL_TABLET | Freq: Every day | ORAL | 0 refills | Status: DC
Start: 2023-07-14 — End: 2023-07-14

## 2023-07-14 NOTE — ED Notes (Signed)
Patient d/c home with orders and medications

## 2023-07-14 NOTE — Discharge Instructions (Signed)
Take the medication as prescribed.  Follow-up with your primary doctor or outpatient therapist.  Return to the ED with new or worsening symptoms.

## 2023-07-14 NOTE — ED Provider Notes (Signed)
Patient has been seen by psychiatry.  Does not meet inpatient criteria.  Recommend with outpatient resources.  Will start Zyprexa 7.5 mg at night.  Resource guide given.   Glynn Octave, MD 07/14/23 [phone]

## 2023-08-18 ENCOUNTER — Emergency Department (HOSPITAL_COMMUNITY)
Admission: EM | Admit: 2023-08-18 | Discharge: 2023-08-19 | Disposition: A | Discharge status: Home / Self Care | Attending: Emergency Medicine | Admitting: Emergency Medicine

## 2023-08-18 ENCOUNTER — Other Ambulatory Visit: Payer: Self-pay

## 2023-08-18 ENCOUNTER — Encounter (HOSPITAL_COMMUNITY): Payer: Self-pay

## 2023-08-18 DIAGNOSIS — R112 Nausea with vomiting, unspecified: Secondary | ICD-10-CM | POA: Insufficient documentation

## 2023-08-18 DIAGNOSIS — Z91040 Latex allergy status: Secondary | ICD-10-CM | POA: Insufficient documentation

## 2023-08-18 DIAGNOSIS — R109 Unspecified abdominal pain: Secondary | ICD-10-CM | POA: Diagnosis present

## 2023-08-18 DIAGNOSIS — R7309 Other abnormal glucose: Secondary | ICD-10-CM | POA: Insufficient documentation

## 2023-08-18 LAB — CBG MONITORING, ED: Glucose-Capillary: 121 mg/dL — ABNORMAL HIGH (ref 70–99)

## 2023-08-18 NOTE — ED Triage Notes (Signed)
 Patient brought in by EMS in custody. Patient was diaphoretic when [facility] up at the jail. Patient has an extensive GI history and states the food in jail causes his gastritis to flare. Patient is actively vomiting in triage. of fentanyl and 4mg  of Zofran given by EMS. 18G LAC. LR  given.

## 2023-08-18 NOTE — ED Notes (Signed)
 Patient told urine sample needed and patient given urinal for collection

## 2023-08-19 LAB — COMPREHENSIVE METABOLIC PANEL
ALT: 17 U/L (ref 0–44)
AST: 30 U/L (ref 15–41)
Albumin: 4.8 g/dL (ref 3.5–5.0)
Alkaline Phosphatase: 67 U/L (ref 38–126)
Anion gap: 13 (ref 5–15)
BUN: 13 mg/dL (ref 6–20)
CO2: 22 mmol/L (ref 22–32)
Calcium: 10.1 mg/dL (ref 8.9–10.3)
Chloride: 101 mmol/L (ref 98–111)
Creatinine, Ser: 1.09 mg/dL (ref 0.61–1.24)
GFR, Estimated: >60 mL/min (ref >60)
Glucose, Bld: 125 mg/dL — ABNORMAL HIGH (ref 70–99)
Potassium: 3.5 mmol/L (ref 3.5–5.1)
Sodium: 136 mmol/L (ref 135–145)
Total Bilirubin: 1.2 mg/dL (ref 0.0–1.2)
Total Protein: 7.9 g/dL (ref 6.5–8.1)

## 2023-08-19 LAB — CBC
HCT: 35.9 % — ABNORMAL LOW (ref 39.0–52.0)
Hemoglobin: 13.1 g/dL (ref 13.0–17.0)
MCH: 34.2 pg — ABNORMAL HIGH (ref 26.0–34.0)
MCHC: 36.5 g/dL — ABNORMAL HIGH (ref 30.0–36.0)
MCV: 93.7 fL (ref 80.0–100.0)
Platelets: 176 10*3/uL (ref 150–400)
RBC: 3.83 MIL/uL — ABNORMAL LOW (ref 4.22–5.81)
RDW: 12.4 % (ref 11.5–15.5)
WBC: 10.1 10*3/uL (ref 4.0–10.5)
nRBC: 0 % (ref 0.0–0.2)

## 2023-08-19 LAB — LIPASE, BLOOD: Lipase: 20 U/L (ref 11–51)

## 2023-08-19 MED ORDER — ALUM & MAG HYDROXIDE-SIMETH 200-200-20 MG/5ML PO SUSP
30.0000 mL | Freq: Once | ORAL | Status: AC
Start: 2023-08-19 — End: 2023-08-19
  Administered 2023-08-19: 30 mL via ORAL
  Filled 2023-08-19: qty 30

## 2023-08-19 MED ORDER — METOCLOPRAMIDE HCL 5 MG/ML IJ SOLN
10.0000 mg | INTRAMUSCULAR | Status: AC
Start: 2023-08-19 — End: 2023-08-19
  Administered 2023-08-19: 10 mg via INTRAVENOUS
  Filled 2023-08-19: qty 2

## 2023-08-19 MED ORDER — ONDANSETRON 4 MG PO TBDP: 4.0000 mg | ORAL_TABLET | Freq: Once | ORAL | Status: DC

## 2023-08-19 MED ORDER — ONDANSETRON HCL 4 MG/2ML IJ SOLN
4.0000 mg | Freq: Once | INTRAMUSCULAR | Status: AC | PRN
  Administered 2023-08-19: 4 mg via INTRAVENOUS
  Filled 2023-08-19: qty 2

## 2023-08-19 MED ORDER — SUCRALFATE 1 G PO TABS: 1.0000 g | ORAL_TABLET | Freq: Three times a day (TID) | ORAL | 0 refills | Status: AC

## 2023-08-19 MED ORDER — PANTOPRAZOLE SODIUM 20 MG PO TBEC: 20.0000 mg | DELAYED_RELEASE_TABLET | Freq: Every day | ORAL | 0 refills | Status: AC

## 2023-08-19 MED ORDER — SUCRALFATE 1 G PO TABS
1.0000 g | ORAL_TABLET | Freq: Once | ORAL | Status: AC
  Administered 2023-08-19: 1 g via ORAL
  Filled 2023-08-19: qty 1

## 2023-08-19 MED ORDER — PANTOPRAZOLE SODIUM 40 MG PO TBEC
40.0000 mg | DELAYED_RELEASE_TABLET | Freq: Every day | ORAL | Status: DC
  Administered 2023-08-19: 40 mg via ORAL
  Filled 2023-08-19: qty 1

## 2023-08-19 NOTE — ED Provider Notes (Signed)
 [place] EMERGENCY DEPARTMENT AT [facility] Provider Note   CSN: [id number] Arrival date & time: 08/18/23  2329     History  Chief Complaint  Patient presents with   Abdominal Pain    Erik Cowan is a 38 y.o. male.  The history is provided by the patient and medical records.  Abdominal Pain Associated symptoms: nausea and vomiting    38 y.o. M with hx of GERD, cyclic vomiting, bipolar disorder, schizophrenia, presenting to the ED for abdominal pain.  Patient states pain began yesterday, feels like a gnawing sensation.  Also has some burning which feels like his acid reflux.  Currently in jail and states the nature of the food has been causing issues.  Had some vomiting today.  No fever/chills.  No diarrhea.  Not currently on PPI or other GERD medications.  Home Medications Prior to Admission medications   Medication Sig Start Date End Date Taking? Authorizing Provider  OLANZapine (ZYPREXA) 7.5 MG tablet Take 1 tablet (7.5 mg total) by mouth at bedtime. 07/13/23   Curlene Labrum, MD      Allergies    Gluten meal, Haldol [haloperidol], Milk-related compounds, and Latex    Review of Systems   Review of Systems  Gastrointestinal:  Positive for abdominal pain, nausea and vomiting.  All other systems reviewed and are negative.   Physical Exam Updated Vital Signs BP (!) 157/88 (BP Location: Right Arm)   Pulse (!) 46   Temp 98.5 F (36.9 C) (Oral)   Resp 18   SpO2 96% Comment: Simultaneous filing. User may not have seen previous data.  Physical Exam Vitals and nursing note reviewed.  Constitutional:      Appearance: He is well-developed.  HENT:     Head: Normocephalic and atraumatic.  Eyes:     Conjunctiva/sclera: Conjunctivae normal.     Pupils: Pupils are equal, round, and reactive to light.  Cardiovascular:     Rate and Rhythm: Normal rate and regular rhythm.     Heart sounds: Normal heart sounds.  Pulmonary:     Effort: Pulmonary effort is  normal.     Breath sounds: Normal breath sounds.  Abdominal:     General: Bowel sounds are normal.     Palpations: Abdomen is soft.     Tenderness: There is no abdominal tenderness. There is no rebound.     Comments: Reports gnawing type pain but no focal tenderness, no peritoneal signs, no distention  Musculoskeletal:        General: Normal range of motion.     Cervical back: Normal range of motion.  Skin:    General: Skin is warm and dry.  Neurological:     Mental Status: He is alert and oriented to person, place, and time.     ED Results / Procedures / Treatments   Labs (all labs ordered are listed, but only abnormal results are displayed) Labs Reviewed  COMPREHENSIVE METABOLIC PANEL - Abnormal; Notable for the following components:      Result Value   Glucose, Bld 125 (*)    All other components within normal limits  CBC - Abnormal; Notable for the following components:   RBC 3.83 (*)    HCT 35.9 (*)    MCH 34.2 (*)    MCHC 36.5 (*)    All other components within normal limits  CBG MONITORING, ED - Abnormal; Notable for the following components:   Glucose-Capillary 121 (*)    All other components within normal  limits  LIPASE, BLOOD  URINALYSIS, ROUTINE W REFLEX MICROSCOPIC    EKG None  Radiology No results found.  Procedures Procedures    Medications Ordered in ED Medications  pantoprazole (PROTONIX) EC tablet 40 mg (40 mg Oral Given 08/19/23 0318)  ondansetron (ZOFRAN) injection 4 mg (4 mg Intravenous Given 08/19/23 0111)  sucralfate (CARAFATE) tablet 1 g (1 g Oral Given 08/19/23 0319)  alum & mag hydroxide-simeth (MAALOX/MYLANTA) 200-200-20 MG/5ML suspension 30 mL (30 mLs Oral Given 08/19/23 0319)  metoCLOPramide (REGLAN) injection 10 mg (10 mg Intravenous Given 08/19/23 0346)    ED Course/ Medical Decision Making/ A&P                                 Medical Decision Making Amount and/or Complexity of Data Reviewed Labs: ordered. ECG/medicine tests:  ordered and independent interpretation performed.  Risk OTC drugs. Prescription drug management.   38 year old male presenting to the ED with abdominal pain.  Longstanding history of recurrent gastritis.  States "present food" makes it worse.  He reports gnawing type pain as well as sensation of acid reflux.  He is afebrile and nontoxic in appearance here.  His abdomen is soft and nontender.  He is not having peritoneal signs.  Labs were obtained from triage which are reassuring without leukocytosis or electrolyte derangement.  Normal lipase.  Does have hx of cyclic vomiting as well.  Patient has had several CT scans in the past, none of which showed any acute pathology.  Will treat symptomatically for suspected recurrent gastritis.  5:20 AM Patient sleeping soundly on his stomach.  No active emesis here.  Stable for discharge.  Will continue PPI and short term carafate.  Discussed diet modification best he can given circumstances.  Can return here for new concerns.  Final Clinical Impression(s) / ED Diagnoses Final diagnoses:  Abdominal pain, unspecified abdominal location    Rx / DC Orders ED Discharge Orders          Ordered    pantoprazole (PROTONIX) 20 MG tablet  Daily        08/19/23 0521    sucralfate (CARAFATE) 1 g tablet  3 times daily with meals & bedtime        08/19/23 0521              Garlon Hatchet, PA-C 08/19/23 0525    Nira Conn, MD 08/19/23 0730

## 2023-08-19 NOTE — ED Notes (Signed)
 AVS provided by edp along with printed prescriptions was given to pt. Discharge instructions were reviewed. Pt verbalized understanding with no additional questions at this time. AVS provided for PD at bedside.

## 2023-08-19 NOTE — Discharge Instructions (Addendum)
 Take the prescribed medication as directed.  Monitor diet-- watch intake of spicy/acidic foods. Return to the ED for new or worsening symptoms.

## 2023-08-19 NOTE — ED Provider Triage Note (Signed)
 Emergency Medicine Provider Triage Evaluation Note  Erik Cowan , a 38 y.o. male  was evaluated in triage.  Pt complains of nausea vomiting that began yesterday.  Patient dates he has history of gastritis and that the prison food flares up.  Patient states he has been vomiting nonstop.  Patient denies any fevers Chest pain or shortness of breath..  Review of Systems  Positive:  Negative:   Physical Exam  BP (!) 157/88 (BP Location: Right Arm)   Pulse (!) 46   Temp 98.5 F (36.9 C) (Oral)   Resp 18   SpO2 96% Comment: Simultaneous filing. User may not have seen previous data. Gen:   Awake, no distress   Resp:  Normal effort  MSK:   Moves extremities without difficulty  Other:  Abdomen soft nontender without peritoneal signs  Medical Decision Making  Medically screening exam initiated at 1:07 AM.  Appropriate orders placed.  Erik Cowan was informed that the remainder of the evaluation will be completed by another provider, this initial triage assessment does not replace that evaluation, and the importance of remaining in the ED until their evaluation is complete.  Workup initiated, patient stable for lobby   Remi Deter 08/19/23 7829

## 2023-10-02 ENCOUNTER — Other Ambulatory Visit: Payer: Self-pay

## 2023-10-02 ENCOUNTER — Emergency Department (HOSPITAL_COMMUNITY)
Admission: EM | Admit: 2023-10-02 | Discharge: 2023-10-02 | Disposition: A | Discharge status: Home / Self Care | Payer: Self-pay | Attending: Emergency Medicine | Admitting: Emergency Medicine

## 2023-10-02 ENCOUNTER — Emergency Department (HOSPITAL_COMMUNITY)
Admission: EM | Admit: 2023-10-02 | Discharge: 2023-10-03 | Disposition: A | Discharge status: Home / Self Care | Payer: Self-pay | Attending: Emergency Medicine | Admitting: Emergency Medicine

## 2023-10-02 ENCOUNTER — Encounter (HOSPITAL_COMMUNITY): Payer: Self-pay

## 2023-10-02 DIAGNOSIS — Z91040 Latex allergy status: Secondary | ICD-10-CM | POA: Insufficient documentation

## 2023-10-02 DIAGNOSIS — R1084 Generalized abdominal pain: Secondary | ICD-10-CM | POA: Insufficient documentation

## 2023-10-02 DIAGNOSIS — D72829 Elevated white blood cell count, unspecified: Secondary | ICD-10-CM | POA: Insufficient documentation

## 2023-10-02 DIAGNOSIS — R109 Unspecified abdominal pain: Secondary | ICD-10-CM

## 2023-10-02 LAB — CBC WITH DIFFERENTIAL/PLATELET
Abs Immature Granulocytes: 0.03 10*3/uL (ref 0.00–0.07)
Basophils Absolute: 0 10*3/uL (ref 0.0–0.1)
Basophils Relative: 0 %
Eosinophils Absolute: 0 10*3/uL (ref 0.0–0.5)
Eosinophils Relative: 0 %
HCT: 41 % (ref 39.0–52.0)
Hemoglobin: 14.3 g/dL (ref 13.0–17.0)
Immature Granulocytes: 0 %
Lymphocytes Relative: 9 %
Lymphs Abs: 0.8 10*3/uL (ref 0.7–4.0)
MCH: 34.2 pg — ABNORMAL HIGH (ref 26.0–34.0)
MCHC: 34.9 g/dL (ref 30.0–36.0)
MCV: 98.1 fL (ref 80.0–100.0)
Monocytes Absolute: 0.4 10*3/uL (ref 0.1–1.0)
Monocytes Relative: 5 %
Neutro Abs: 7.6 10*3/uL (ref 1.7–7.7)
Neutrophils Relative %: 86 %
Platelets: 160 10*3/uL (ref 150–400)
RBC: 4.18 MIL/uL — ABNORMAL LOW (ref 4.22–5.81)
RDW: 12.3 % (ref 11.5–15.5)
WBC: 8.9 10*3/uL (ref 4.0–10.5)
nRBC: 0 % (ref 0.0–0.2)

## 2023-10-02 LAB — CBC
HCT: 38.6 % — ABNORMAL LOW (ref 39.0–52.0)
Hemoglobin: 13.8 g/dL (ref 13.0–17.0)
MCH: 34.3 pg — ABNORMAL HIGH (ref 26.0–34.0)
MCHC: 35.8 g/dL (ref 30.0–36.0)
MCV: 96 fL (ref 80.0–100.0)
Platelets: 182 10*3/uL (ref 150–400)
RBC: 4.02 MIL/uL — ABNORMAL LOW (ref 4.22–5.81)
RDW: 12.2 % (ref 11.5–15.5)
WBC: 18.8 10*3/uL — ABNORMAL HIGH (ref 4.0–10.5)
nRBC: 0 % (ref 0.0–0.2)

## 2023-10-02 LAB — COMPREHENSIVE METABOLIC PANEL WITH GFR
ALT: 14 U/L (ref 0–44)
AST: 21 U/L (ref 15–41)
Albumin: 4.9 g/dL (ref 3.5–5.0)
Alkaline Phosphatase: 79 U/L (ref 38–126)
Anion gap: 9 (ref 5–15)
BUN: 19 mg/dL (ref 6–20)
CO2: 25 mmol/L (ref 22–32)
Calcium: 10 mg/dL (ref 8.9–10.3)
Chloride: 104 mmol/L (ref 98–111)
Creatinine, Ser: 1.02 mg/dL (ref 0.61–1.24)
GFR, Estimated: >60 mL/min (ref >60)
Glucose, Bld: 120 mg/dL — ABNORMAL HIGH (ref 70–99)
Potassium: 3.7 mmol/L (ref 3.5–5.1)
Sodium: 138 mmol/L (ref 135–145)
Total Bilirubin: 0.9 mg/dL (ref 0.0–1.2)
Total Protein: 8.3 g/dL — ABNORMAL HIGH (ref 6.5–8.1)

## 2023-10-02 LAB — LIPASE, BLOOD: Lipase: 26 U/L (ref 11–51)

## 2023-10-02 MED ORDER — METOCLOPRAMIDE HCL 5 MG/ML IJ SOLN
10.0000 mg | Freq: Once | INTRAMUSCULAR | Status: AC
  Administered 2023-10-02: 10 mg via INTRAVENOUS
  Filled 2023-10-02: qty 2

## 2023-10-02 MED ORDER — PANTOPRAZOLE SODIUM 40 MG PO TBEC: 40.0000 mg | DELAYED_RELEASE_TABLET | Freq: Every day | ORAL | 0 refills | Status: AC

## 2023-10-02 MED ORDER — SODIUM CHLORIDE 0.9 % IV SOLN: INTRAVENOUS | Status: DC

## 2023-10-02 MED ORDER — SODIUM CHLORIDE 0.9 % IV BOLUS
2000.0000 mL | Freq: Once | INTRAVENOUS | Status: AC
  Administered 2023-10-02: 2000 mL via INTRAVENOUS

## 2023-10-02 MED ORDER — DIPHENHYDRAMINE HCL 50 MG/ML IJ SOLN
12.5000 mg | Freq: Once | INTRAMUSCULAR | Status: AC
  Administered 2023-10-02: 12.5 mg via INTRAVENOUS
  Filled 2023-10-02: qty 1

## 2023-10-02 MED ORDER — PANTOPRAZOLE SODIUM 40 MG IV SOLR
40.0000 mg | Freq: Once | INTRAVENOUS | Status: AC
  Administered 2023-10-02: 40 mg via INTRAVENOUS
  Filled 2023-10-02: qty 10

## 2023-10-02 MED ORDER — ONDANSETRON 8 MG PO TBDP: 8.0000 mg | ORAL_TABLET | Freq: Three times a day (TID) | ORAL | 0 refills | Status: DC | PRN

## 2023-10-02 NOTE — ED Provider Notes (Signed)
 [place] EMERGENCY DEPARTMENT AT [facility] Provider Note   CSN: [id number] Arrival date & time: 10/02/23  0800     History  Chief Complaint  Patient presents with   Abdominal Pain    Erik Cowan is a 38 y.o. male.  38 year old male presents with emesis which began after eating spaghetti last night.  Patient endorses diffuse crampy abdominal discomfort without focality.  Denies any urinary symptoms.  States he has a history of peptic ulcer disease and Bell peppers can make this worse at times.  Notes that the world Bell peppers in his meal yesterday as well as gluten which she has allergy to as well.  He also admits to smoking 2 marijuana cigarettes a day and smoked just before he came here.  States he is thrown up about 15 times but denies any cough or emesis.  No cardiac symptomatology.  No fever or chills.  Notes he does smoke marijuana daily       Home Medications Prior to Admission medications   Medication Sig Start Date End Date Taking? Authorizing Provider  OLANZapine  (ZYPREXA ) 7.5 MG tablet Take 1 tablet (7.5 mg total) by mouth at bedtime. 07/13/23   Deirdre Fate, MD  pantoprazole  (PROTONIX ) 20 MG tablet Take 1 tablet (20 mg total) by mouth daily. 08/19/23   Coretha Dew, PA-C  sucralfate  (CARAFATE ) 1 g tablet Take 1 tablet (1 g total) by mouth 4 (four) times daily -  with meals and at bedtime. 08/19/23   Coretha Dew, PA-C      Allergies    Gluten meal, Haldol  [haloperidol ], Milk-related compounds, and Latex    Review of Systems   Review of Systems  All other systems reviewed and are negative.   Physical Exam Updated Vital Signs BP (!) 152/91 (BP Location: Left Arm)   Pulse 73   Temp (!) 97.4 F (36.3 C) (Oral)   Resp 16   Ht 1.88 m (6\' 2" )   Wt 72.6 kg   SpO2 100%   BMI 20.54 kg/m  Physical Exam Vitals and nursing note reviewed.  Constitutional:      General: He is not in acute distress.    Appearance: Normal appearance. He is  well-developed. He is not toxic-appearing.  HENT:     Head: Normocephalic and atraumatic.  Eyes:     General: Lids are normal.     Conjunctiva/sclera: Conjunctivae normal.     Pupils: Pupils are equal, round, and reactive to light.  Neck:     Thyroid: No thyroid mass.     Trachea: No tracheal deviation.  Cardiovascular:     Rate and Rhythm: Normal rate and regular rhythm.     Heart sounds: Normal heart sounds. No murmur heard.    No gallop.  Pulmonary:     Effort: Pulmonary effort is normal. No respiratory distress.     Breath sounds: Normal breath sounds. No stridor. No decreased breath sounds, wheezing, rhonchi or rales.  Abdominal:     General: There is no distension.     Palpations: Abdomen is soft.     Tenderness: There is generalized abdominal tenderness. There is no guarding or rebound.  Musculoskeletal:        General: No tenderness. Normal range of motion.     Cervical back: Normal range of motion and neck supple.  Skin:    General: Skin is warm and dry.     Findings: No abrasion or rash.  Neurological:  Mental Status: He is alert and oriented to person, place, and time. Mental status is at baseline.     GCS: GCS eye subscore is 4. GCS verbal subscore is 5. GCS motor subscore is 6.     Cranial Nerves: No cranial nerve deficit.     Sensory: No sensory deficit.     Motor: Motor function is intact.  Psychiatric:        Attention and Perception: Attention normal.        Speech: Speech normal.        Behavior: Behavior normal.     ED Results / Procedures / Treatments   Labs (all labs ordered are listed, but only abnormal results are displayed) Labs Reviewed  CBC WITH DIFFERENTIAL/PLATELET - Abnormal; Notable for the following components:      Result Value   RBC 4.18 (*)    MCH 34.2 (*)    All other components within normal limits  COMPREHENSIVE METABOLIC PANEL WITH GFR  LIPASE, BLOOD  URINALYSIS, ROUTINE W REFLEX MICROSCOPIC    EKG None  Radiology No  results found.  Procedures Procedures    Medications Ordered in ED Medications  pantoprazole  (PROTONIX ) injection 40 mg (has no administration in time range)  metoCLOPramide  (REGLAN ) injection 10 mg (has no administration in time range)  diphenhydrAMINE  (BENADRYL ) injection 12.5 mg (has no administration in time range)  sodium chloride  0.9 % bolus 2,000 mL (has no administration in time range)  0.9 %  sodium chloride  infusion (has no administration in time range)    ED Course/ Medical Decision Making/ A&P                                 Medical Decision Making Amount and/or Complexity of Data Reviewed Labs: ordered.  Risk Prescription drug management.   Patient treated for likely cannabinoid hyperemesis versus peptic ulcer disease and feels better.  No emesis here.  Labs are reassuring.  Low suspicion for serious intra-abdominal pathology such as pancreatitis or appendicitis.  Do not feel that he needs imaging at this time.  Will discharge home with antiemetics and PPIs.        Final Clinical Impression(s) / ED Diagnoses Final diagnoses:  None    Rx / DC Orders ED Discharge Orders     None         Lind Repine, MD 10/02/23 [phone]

## 2023-10-02 NOTE — ED Triage Notes (Signed)
 Pt reports with abdominal pain and vomiting since last night after eating spaghetti.

## 2023-10-02 NOTE — ED Triage Notes (Signed)
 Pt came in for abdominal pain and emesis. Pt was here earlier today for the same issue but no relief.

## 2023-10-02 NOTE — ED Provider Notes (Signed)
 [facility] Provider Note   CSN: [id number] Arrival date & time: 10/02/23  2226     History {Add pertinent medical, surgical, social history, OB history to HPI:1} No chief complaint on file.   Erik Cowan is a 38 y.o. male.  The history is provided by the patient and medical records.   38 year old male with history of abdominal pain, bipolar disorder, marijuana abuse, cyclic vomiting, presenting to the ED for abdominal pain.  Patient was seen here this morning and had labs which were reassuring.  He was treated with IV fluids, Reglan , and Benadryl .  States he feels worse now.  He reports continued emesis that has been nonbloody.  He denies any diarrhea.  No fever or chills.  States he has not eaten anything since the spaghetti yesterday evening which he thinks is what set off his symptoms.  He does have history of peptic ulcer.  Home Medications Prior to Admission medications   Medication Sig Start Date End Date Taking? Authorizing Provider  OLANZapine  (ZYPREXA ) 7.5 MG tablet Take 1 tablet (7.5 mg total) by mouth at bedtime. 07/13/23   Deirdre Fate, MD  ondansetron  (ZOFRAN -ODT) 8 MG disintegrating tablet Take 1 tablet (8 mg total) by mouth every 8 (eight) hours as needed for nausea or vomiting. 10/02/23   Lind Repine, MD  pantoprazole  (PROTONIX ) 20 MG tablet Take 1 tablet (20 mg total) by mouth daily. 08/19/23   Coretha Dew, PA-C  pantoprazole  (PROTONIX ) 40 MG tablet Take 1 tablet (40 mg total) by mouth daily. 10/02/23   Lind Repine, MD  sucralfate  (CARAFATE ) 1 g tablet Take 1 tablet (1 g total) by mouth 4 (four) times daily -  with meals and at bedtime. 08/19/23   Coretha Dew, PA-C      Allergies    Gluten meal, Haldol  [haloperidol ], Milk-related compounds, and Latex    Review of Systems   Review of Systems  Gastrointestinal:  Positive for abdominal pain.  All other systems reviewed and are negative.   Physical  Exam Updated Vital Signs BP (!) 153/97 (BP Location: Right Arm)   Pulse 97   Temp 98 F (36.7 C) (Oral)   Resp 18   Ht 6\' 2"  (1.88 m)   Wt 72.6 kg   SpO2 96%   BMI 20.54 kg/m  Physical Exam Vitals and nursing note reviewed.  Constitutional:      Appearance: He is well-developed.     Comments: Writhing around on stretcher, able to sit still for abdominal exam  HENT:     Head: Normocephalic and atraumatic.  Eyes:     Conjunctiva/sclera: Conjunctivae normal.     Pupils: Pupils are equal, round, and reactive to light.  Cardiovascular:     Rate and Rhythm: Normal rate and regular rhythm.     Heart sounds: Normal heart sounds.  Pulmonary:     Effort: Pulmonary effort is normal.     Breath sounds: Normal breath sounds.  Abdominal:     General: Bowel sounds are normal.     Palpations: Abdomen is soft.     Tenderness: There is abdominal tenderness in the left upper quadrant.     Comments: Normal bowel sounds, tender LUQ  Musculoskeletal:        General: Normal range of motion.     Cervical back: Normal range of motion.  Skin:    General: Skin is warm and dry.  Neurological:     Mental Status: He is  alert and oriented to person, place, and time.     ED Results / Procedures / Treatments   Labs (all labs ordered are listed, but only abnormal results are displayed) Labs Reviewed  CBC - Abnormal; Notable for the following components:      Result Value   WBC 18.8 (*)    RBC 4.02 (*)    HCT 38.6 (*)    MCH 34.3 (*)    All other components within normal limits  LIPASE, BLOOD  COMPREHENSIVE METABOLIC PANEL WITH GFR  URINALYSIS, ROUTINE W REFLEX MICROSCOPIC  RAPID URINE DRUG SCREEN, HOSP PERFORMED    EKG None  Radiology No results found.  Procedures Procedures  {Document cardiac monitor, telemetry assessment procedure when appropriate:1}  Medications Ordered in ED Medications - No data to display  ED Course/ Medical Decision Making/ A&P   {   Click here for  ABCD2, HEART and other calculatorsREFRESH Note before signing :1}                              Medical Decision Making Amount and/or Complexity of Data Reviewed Labs: ordered. Radiology: ordered. ECG/medicine tests: ordered.   ***  {Document critical care time when appropriate:1} {Document review of labs and clinical decision tools ie heart score, Chads2Vasc2 etc:1}  {Document your independent review of radiology images, and any outside records:1} {Document your discussion with family members, caretakers, and with consultants:1} {Document social determinants of health affecting pt's care:1} {Document your decision making why or why not admission, treatments were needed:1} Final Clinical Impression(s) / ED Diagnoses Final diagnoses:  None    Rx / DC Orders ED Discharge Orders     None

## 2023-10-02 NOTE — ED Notes (Signed)
 Pt could not urinate.  Clicked off by [employer].   Will try again in 30 minutes

## 2023-10-02 NOTE — ED Notes (Signed)
 Called pt to come back twice. Pt is still in the bathroom.

## 2023-10-03 ENCOUNTER — Telehealth: Payer: Self-pay | Admitting: General Practice

## 2023-10-03 ENCOUNTER — Emergency Department (HOSPITAL_COMMUNITY): Payer: Self-pay

## 2023-10-03 LAB — COMPREHENSIVE METABOLIC PANEL WITH GFR
ALT: 15 U/L (ref 0–44)
AST: 38 U/L (ref 15–41)
Albumin: 5.1 g/dL — ABNORMAL HIGH (ref 3.5–5.0)
Alkaline Phosphatase: 83 U/L (ref 38–126)
Anion gap: 13 (ref 5–15)
BUN: 22 mg/dL — ABNORMAL HIGH (ref 6–20)
CO2: 23 mmol/L (ref 22–32)
Calcium: 10.2 mg/dL (ref 8.9–10.3)
Chloride: 101 mmol/L (ref 98–111)
Creatinine, Ser: 1.24 mg/dL (ref 0.61–1.24)
GFR, Estimated: >60 mL/min (ref >60)
Glucose, Bld: 120 mg/dL — ABNORMAL HIGH (ref 70–99)
Potassium: 3.9 mmol/L (ref 3.5–5.1)
Sodium: 137 mmol/L (ref 135–145)
Total Bilirubin: 1.4 mg/dL — ABNORMAL HIGH (ref 0.0–1.2)
Total Protein: 8.9 g/dL — ABNORMAL HIGH (ref 6.5–8.1)

## 2023-10-03 LAB — LIPASE, BLOOD: Lipase: 23 U/L (ref 11–51)

## 2023-10-03 MED ORDER — DROPERIDOL 2.5 MG/ML IJ SOLN
2.5000 mg | Freq: Once | INTRAMUSCULAR | Status: AC
  Administered 2023-10-03: 2.5 mg via INTRAVENOUS
  Filled 2023-10-03: qty 2

## 2023-10-03 MED ORDER — DICYCLOMINE HCL 10 MG PO CAPS
20.0000 mg | ORAL_CAPSULE | Freq: Once | ORAL | Status: AC
  Administered 2023-10-03: 20 mg via ORAL
  Filled 2023-10-03: qty 2

## 2023-10-03 MED ORDER — IOHEXOL 300 MG/ML  SOLN
100.0000 mL | Freq: Once | INTRAMUSCULAR | Status: AC | PRN
  Administered 2023-10-03: 100 mL via INTRAVENOUS

## 2023-10-03 MED ORDER — SODIUM CHLORIDE 0.9 % IV BOLUS
1000.0000 mL | Freq: Once | INTRAVENOUS | Status: AC
  Administered 2023-10-03: 1000 mL via INTRAVENOUS

## 2023-10-03 NOTE — Telephone Encounter (Signed)
 Copied from CRM [id number]. Topic: Appointments - Scheduling Inquiry for Clinic >> Oct 03, 2023  3:45 PM Ethelle Herb L wrote: Reason for CRM: Patient in need of a hospital f/u. Pt seen at [facility], discharged this morning 10/03/23.   Requesting an appointment as soon as possible.   Please reach out to patient to schedule, [phone] or [phone]

## 2023-10-03 NOTE — ED Notes (Addendum)
 Pt was in the room completely dark on the floor, made sure he did not fall he stated that it was comfortable on the floor. Pt was doing push ups and bouncing around while I was in the room. I was able to get EKG

## 2023-10-03 NOTE — Discharge Instructions (Addendum)
 Take the medications you were given from earlier ED visit.  Recommend that you follow-up with GI if symptoms persist. Return to the ED for new or worsening symptoms.

## 2023-10-10 ENCOUNTER — Ambulatory Visit (INDEPENDENT_AMBULATORY_CARE_PROVIDER_SITE_OTHER): Payer: Self-pay | Admitting: Family

## 2023-10-10 ENCOUNTER — Ambulatory Visit: Admit: 2023-10-10 | Discharge: 2023-10-10 | Discharge status: Absent without leave | Payer: Self-pay

## 2023-10-10 ENCOUNTER — Telehealth: Payer: Self-pay | Admitting: Family

## 2023-10-10 ENCOUNTER — Other Ambulatory Visit: Payer: Self-pay | Admitting: Family

## 2023-10-10 VITALS — BP 149/101 | HR 61 | Temp 98.5°F | Resp 16 | Ht 74.0 in | Wt 163.0 lb

## 2023-10-10 DIAGNOSIS — R030 Elevated blood-pressure reading, without diagnosis of hypertension: Secondary | ICD-10-CM

## 2023-10-10 DIAGNOSIS — R1084 Generalized abdominal pain: Secondary | ICD-10-CM

## 2023-10-10 DIAGNOSIS — Z09 Encounter for follow-up examination after completed treatment for conditions other than malignant neoplasm: Secondary | ICD-10-CM

## 2023-10-10 MED ORDER — ONDANSETRON 8 MG PO TBDP: 8.0000 mg | ORAL_TABLET | Freq: Three times a day (TID) | ORAL | 2 refills | Status: AC | PRN

## 2023-10-10 NOTE — Telephone Encounter (Signed)
 Patient will need refills for ondanestron  sent to pharmacy  Erik Cow[facility] patient has four pills left

## 2023-10-10 NOTE — Progress Notes (Signed)
 Patient ID: Erik Cowan, male    DOB: July 27, 1985  MRN: [id number]  CC: Emergency Department Follow-Up  Subjective: Erik Cowan is a 38 y.o. male who presents for Emergency Department follow-up.   His concerns today include:  10/02/2023 [facility] Emergency Department at [facility] per MD note: ED Course/ Medical Decision Making/ A&P                               Medical Decision Making Amount and/or Complexity of Data Reviewed Labs: ordered.   Risk Prescription drug management.     Patient treated for likely cannabinoid hyperemesis versus peptic ulcer disease and feels better.  No emesis here.  Labs are reassuring.  Low suspicion for serious intra-abdominal pathology such as pancreatitis or appendicitis.  Do not feel that he needs imaging at this time.  Will discharge home with antiemetics and PPIs.   10/02/2023 - 10/03/2023 [place] Emergency Department at [facility] per MD note: ED Course/ Medical Decision Making/ A&P                               Medical Decision Making Amount and/or Complexity of Data Reviewed Labs: ordered. Radiology: ordered and independent interpretation performed. ECG/medicine tests: ordered and independent interpretation performed.   Risk Prescription drug management.     38 year old male here with abdominal pain.  He was in the ER earlier today a with normal labs.  He states he returns because "it is worse".  Patient is writhing around in the bed but is able to sit still for exam.  He endorses pain mostly in his left upper quadrant.  He does not appear peritoneal.  Labs were repeated he does have leukocytosis to 18,000 now.  No electrolyte derangement.  Normal lipase.  Will obtain CT scan.   3:38 AM Patient has been sleeping for the past several hours after droperidol , now awake and pacing around the room stating he is in "severe pain".  Aside from leukocytosis today's labs are overall reassuring.  This may be reactive.   His CT scan is negative which I discussed with him.  He would not provide urine sample today for UDS testing.  Does have history of peptic ulcers, was provided with prescription for Protonix  earlier today which she has yet to fill.  He was encouraged to fill this and follow-up closely with GI.  He can return here for any new or acute changes.  Follow-Ups: Follow up with Baldo Bonds, MD (Gastroenterology)  Today's office visit 10/10/2023: States abdominal pain persisting. Denies red flag symptoms. States he continues to have difficulty eating and consuming beverages. States he does not think his symptoms are related to cannabis consumption due to states he has been several months without it and his symptoms persisted. States when he has episodes of vomiting he develops hiccups which can last up to 1 month or more. States sometimes when hiccups are severe this affects breathing as well. States he doesn't feel the medical team listens to him when he goes to the Emergency Department once he tells them that marijuana is in his system. He has not established with Gastroenterology since most recent Emergency Department discharge. No further issues/concerns for discussion today.  Patient Active Problem List   Diagnosis Date Noted   Polysubstance abuse (HCC) 01/24/2022   Malnutrition of moderate degree 01/23/2022   AMS (altered  mental status) 01/22/2022   Intractable nausea and vomiting 01/22/2021   Acute renal failure (HCC) 01/22/2021   Metabolic acidosis, increased anion gap (IAG) 01/22/2021   Cannabis use disorder, severe, dependence (HCC) 03/19/2015   Bipolar 1 disorder (HCC) 03/19/2015   Heart murmur 05/23/2012   Dehydration, mild 05/23/2012   Hyponatremia 06/04/2011   Hypokalemia 06/04/2011   Leukocytosis 06/04/2011   GERD (gastroesophageal reflux disease)    Nephrolithiasis    Cyclic vomiting syndrome    Abdominal pain, epigastric 06/10/2010     Current Outpatient Medications on File  Prior to Visit  Medication Sig Dispense Refill   OLANZapine  (ZYPREXA ) 7.5 MG tablet Take 1 tablet (7.5 mg total) by mouth at bedtime. 15 tablet 0   ondansetron  (ZOFRAN -ODT) 8 MG disintegrating tablet Take 1 tablet (8 mg total) by mouth every 8 (eight) hours as needed for nausea or vomiting. 20 tablet 0   pantoprazole  (PROTONIX ) 20 MG tablet Take 1 tablet (20 mg total) by mouth daily. 30 tablet 0   pantoprazole  (PROTONIX ) 40 MG tablet Take 1 tablet (40 mg total) by mouth daily. 30 tablet 0   sucralfate  (CARAFATE ) 1 g tablet Take 1 tablet (1 g total) by mouth 4 (four) times daily -  with meals and at bedtime. 20 tablet 0   No current facility-administered medications on file prior to visit.    Allergies  Allergen Reactions   Gluten Meal Nausea And Vomiting    Stomach pains   Haldol  [Haloperidol ]     Makes me anxious    Milk-Related Compounds Nausea And Vomiting    Stomach upset   Latex Rash    Social History   Socioeconomic History   Marital status: Single    Spouse name: Not on file   Number of children: Not on file   Years of education: Not on file   Highest education level: Not on file  Occupational History   Occupation: KITCHEN CREW    Employer: [employer]    Comment: [employer]  Tobacco Use   Smoking status: Every Day    Current packs/day: 0.00    Types: Cigarettes    Start date: 06/03/2006    Last attempt to quit: 06/04/2007    Years since quitting: 16.3   Smokeless tobacco: Never   Tobacco comments:    see illicit drug comment  Substance and Sexual Activity   Alcohol use: Yes    Comment: occasional-once per month   Drug use: Yes    Frequency: 7.0 times per week    Types: Marijuana    Comment: smokes marijuana daily   Sexual activity: Never  Other Topics Concern   Not on file  Social History Narrative   Single.  Lives with his Aunt. Works part-time as a Buyer, retail.   Social Drivers of Health   Financial Resource Strain: Medium Risk  (10/10/2023)   Overall Financial Resource Strain (CARDIA)    Difficulty of Paying Living Expenses: Somewhat hard  Food Insecurity: Food Insecurity Present (10/10/2023)   Hunger Vital Sign    Worried About Running Out of Food in the Last Year: Often true    Ran Out of Food in the Last Year: Often true  Transportation Needs: No Transportation Needs (10/10/2023)   PRAPARE - Administrator, Civil Service (Medical): No    Lack of Transportation (Non-Medical): No  Physical Activity: Inactive (10/10/2023)   Exercise Vital Sign    Days of Exercise per Week: 0 days  Minutes of Exercise per Session: 0 min  Stress: Stress Concern Present (10/10/2023)   [employer] of Occupational Health - Occupational Stress Questionnaire    Feeling of Stress : To some extent  Social Connections: Moderately Isolated (10/10/2023)   Social Connection and Isolation Panel [NHANES]    Frequency of Communication with Friends and Family: Three times a week    Frequency of Social Gatherings with Friends and Family: Three times a week    Attends Religious Services: 1 to 4 times per year    Active Member of Clubs or Organizations: No    Attends Banker Meetings: Never    Marital Status: Never married  Intimate Partner Violence: Not At Risk (10/10/2023)   Humiliation, Afraid, Rape, and Kick questionnaire    Fear of Current or Ex-Partner: No    Emotionally Abused: No    Physically Abused: No    Sexually Abused: No    Family History  Problem Relation Age of Onset   Liver cancer Mother    Colon cancer Maternal Uncle    Diabetes Maternal Aunt    Heart disease Paternal Grandfather    Testicular cancer Brother     Past Surgical History:  Procedure Laterality Date   APPENDECTOMY     unremarkable      ROS: Review of Systems Negative except as stated above  PHYSICAL EXAM: BP (!) 149/101   Pulse 61   Temp 98.5 F (36.9 C) (Oral)   Resp 16   Ht 6\' 2"  (1.88 m)   Wt 163 lb (73.9 kg)    SpO2 95%   BMI 20.93 kg/m   Physical Exam HENT:     Head: Normocephalic and atraumatic.     Nose: Nose normal.     Mouth/Throat:     Mouth: Mucous membranes are moist.     Pharynx: Oropharynx is clear.  Eyes:     Extraocular Movements: Extraocular movements intact.     Conjunctiva/sclera: Conjunctivae normal.     Pupils: Pupils are equal, round, and reactive to light.  Cardiovascular:     Rate and Rhythm: Normal rate and regular rhythm.     Pulses: Normal pulses.     Heart sounds: Normal heart sounds.  Pulmonary:     Effort: Pulmonary effort is normal.     Breath sounds: Normal breath sounds.  Abdominal:     General: Bowel sounds are normal.     Palpations: Abdomen is soft.  Musculoskeletal:        General: Normal range of motion.     Cervical back: Normal range of motion and neck supple.  Neurological:     General: No focal deficit present.     Mental Status: He is alert and oriented to person, place, and time.  Psychiatric:        Mood and Affect: Mood normal.        Behavior: Behavior normal.     ASSESSMENT AND PLAN: 1. Generalized abdominal pain (Primary) - Referral to Gastroenterology for evaluation/management. - Ambulatory referral to Gastroenterology  2. Elevated blood pressure reading - Blood pressure not at goal during today's visit. Patient asymptomatic without chest pressure, chest pain, palpitations, shortness of breath, worst headache of life, and any additional red flag symptoms. - Follow-up with primary provider in 4 weeks or sooner if needed.   Patient was given the opportunity to ask questions.  Patient verbalized understanding of the plan and was able to repeat key elements of the plan. Patient was given  clear instructions to go to Emergency Department or return to medical center if symptoms don't improve, worsen, or new problems develop.The patient verbalized understanding.   Orders Placed This Encounter  Procedures   Ambulatory referral to  Gastroenterology   Follow-up with primary provider as scheduled.  Senaida Dama, NP

## 2023-10-10 NOTE — Telephone Encounter (Signed)
 Complete

## 2023-10-10 NOTE — Progress Notes (Signed)
 Patient said abdomen pain is 8/10 today following hospital stay. Patient said he is having to eat extremely small bits. Patient said he has had abdomen issues for his entire life,

## 2023-10-31 ENCOUNTER — Other Ambulatory Visit (HOSPITAL_COMMUNITY): Payer: Self-pay | Admitting: Student

## 2023-10-31 DIAGNOSIS — R112 Nausea with vomiting, unspecified: Secondary | ICD-10-CM

## 2023-11-01 ENCOUNTER — Encounter: Payer: Self-pay | Admitting: Family

## 2023-11-01 ENCOUNTER — Ambulatory Visit (INDEPENDENT_AMBULATORY_CARE_PROVIDER_SITE_OTHER): Payer: Self-pay | Admitting: Family

## 2023-11-01 VITALS — BP 147/87 | HR 76 | Temp 98.2°F | Resp 16 | Ht 74.0 in | Wt 169.2 lb

## 2023-11-01 DIAGNOSIS — I10 Essential (primary) hypertension: Secondary | ICD-10-CM

## 2023-11-01 DIAGNOSIS — Z13220 Encounter for screening for lipoid disorders: Secondary | ICD-10-CM

## 2023-11-01 DIAGNOSIS — Z130 Encounter for screening for diseases of the blood and blood-forming organs and certain disorders involving the immune mechanism: Secondary | ICD-10-CM

## 2023-11-01 DIAGNOSIS — Z0000 Encounter for general adult medical examination without abnormal findings: Secondary | ICD-10-CM

## 2023-11-01 DIAGNOSIS — Z13228 Encounter for screening for other metabolic disorders: Secondary | ICD-10-CM

## 2023-11-01 DIAGNOSIS — F418 Other specified anxiety disorders: Secondary | ICD-10-CM

## 2023-11-01 DIAGNOSIS — Z131 Encounter for screening for diabetes mellitus: Secondary | ICD-10-CM

## 2023-11-01 DIAGNOSIS — Z1329 Encounter for screening for other suspected endocrine disorder: Secondary | ICD-10-CM

## 2023-11-01 DIAGNOSIS — F419 Anxiety disorder, unspecified: Secondary | ICD-10-CM

## 2023-11-01 MED ORDER — VALSARTAN 40 MG PO TABS: 40.0000 mg | ORAL_TABLET | Freq: Every day | ORAL | 0 refills | Status: DC

## 2023-11-01 NOTE — Progress Notes (Signed)
 No concerns, patient scored a 11 on the PHQ9 , and a 18 on the GAD7

## 2023-11-01 NOTE — Progress Notes (Signed)
 Patient ID: Erik Cowan, male    DOB: 1985-08-27  MRN: [id number]  CC: Annual Exam  Subjective: Erik Cowan is a 38 y.o. male who presents for annual exam.   His concerns today include:  - Blood pressure check. He does not complain of red flag symptoms such as but not limited to chest pain, shortness of breath, worst headache of life, nausea/vomiting.  - States anxiety depression related to he has "charges", child support, and life. He denies thoughts of self-harm, suicidal ideations, homicidal ideations.  Patient Active Problem List   Diagnosis Date Noted   Polysubstance abuse (HCC) 01/24/2022   Malnutrition of moderate degree 01/23/2022   AMS (altered mental status) 01/22/2022   Intractable nausea and vomiting 01/22/2021   Acute renal failure (HCC) 01/22/2021   Metabolic acidosis, increased anion gap (IAG) 01/22/2021   Cannabis use disorder, severe, dependence (HCC) 03/19/2015   Bipolar 1 disorder (HCC) 03/19/2015   Heart murmur 05/23/2012   Dehydration, mild 05/23/2012   Hyponatremia 06/04/2011   Hypokalemia 06/04/2011   Leukocytosis 06/04/2011   GERD (gastroesophageal reflux disease)    Nephrolithiasis    Cyclic vomiting syndrome    Abdominal pain, epigastric 06/10/2010     Current Outpatient Medications on File Prior to Visit  Medication Sig Dispense Refill   OLANZapine  (ZYPREXA ) 7.5 MG tablet Take 1 tablet (7.5 mg total) by mouth at bedtime. 15 tablet 0   ondansetron  (ZOFRAN -ODT) 8 MG disintegrating tablet Take 1 tablet (8 mg total) by mouth every 8 (eight) hours as needed for nausea or vomiting. 30 tablet 2   pantoprazole  (PROTONIX ) 20 MG tablet Take 1 tablet (20 mg total) by mouth daily. 30 tablet 0   pantoprazole  (PROTONIX ) 40 MG tablet Take 1 tablet (40 mg total) by mouth daily. 30 tablet 0   sucralfate  (CARAFATE ) 1 g tablet Take 1 tablet (1 g total) by mouth 4 (four) times daily -  with meals and at bedtime. 20 tablet 0   No current  facility-administered medications on file prior to visit.    Allergies  Allergen Reactions   Gluten Meal Nausea And Vomiting    Stomach pains   Haldol  [Haloperidol ]     Makes me anxious    Milk-Related Compounds Nausea And Vomiting    Stomach upset   Latex Rash    Social History   Socioeconomic History   Marital status: Single    Spouse name: Not on file   Number of children: Not on file   Years of education: Not on file   Highest education level: Not on file  Occupational History   Occupation: KITCHEN CREW    Employer: [employer]    Comment: [employer]  Tobacco Use   Smoking status: Every Day    Current packs/day: 0.00    Types: Cigarettes    Start date: 06/03/2006    Last attempt to quit: 06/04/2007    Years since quitting: 16.4   Smokeless tobacco: Never   Tobacco comments:    see illicit drug comment  Substance and Sexual Activity   Alcohol use: Yes    Comment: occasional-once per month   Drug use: Yes    Frequency: 7.0 times per week    Types: Marijuana    Comment: smokes marijuana daily   Sexual activity: Never  Other Topics Concern   Not on file  Social History Narrative   Single.  Lives with his Aunt. Works part-time as a Buyer, retail.   Social Drivers of  Health   Financial Resource Strain: Medium Risk (10/10/2023)   Overall Financial Resource Strain (CARDIA)    Difficulty of Paying Living Expenses: Somewhat hard  Food Insecurity: Food Insecurity Present (10/10/2023)   Hunger Vital Sign    Worried About Running Out of Food in the Last Year: Often true    Ran Out of Food in the Last Year: Often true  Transportation Needs: No Transportation Needs (10/10/2023)   PRAPARE - Administrator, Civil Service (Medical): No    Lack of Transportation (Non-Medical): No  Physical Activity: Inactive (10/10/2023)   Exercise Vital Sign    Days of Exercise per Week: 0 days    Minutes of Exercise per Session: 0 min  Stress: Stress Concern  Present (10/10/2023)   [employer] of Occupational Health - Occupational Stress Questionnaire    Feeling of Stress : To some extent  Social Connections: Moderately Isolated (10/10/2023)   Social Connection and Isolation Panel [NHANES]    Frequency of Communication with Friends and Family: Three times a week    Frequency of Social Gatherings with Friends and Family: Three times a week    Attends Religious Services: 1 to 4 times per year    Active Member of Clubs or Organizations: No    Attends Banker Meetings: Never    Marital Status: Never married  Intimate Partner Violence: Not At Risk (10/10/2023)   Humiliation, Afraid, Rape, and Kick questionnaire    Fear of Current or Ex-Partner: No    Emotionally Abused: No    Physically Abused: No    Sexually Abused: No    Family History  Problem Relation Age of Onset   Liver cancer Mother    Colon cancer Maternal Uncle    Diabetes Maternal Aunt    Heart disease Paternal Grandfather    Testicular cancer Brother     Past Surgical History:  Procedure Laterality Date   APPENDECTOMY     unremarkable      ROS: Review of Systems Negative except as stated above  PHYSICAL EXAM: BP (!) 147/87   Pulse 76   Temp 98.2 F (36.8 C) (Oral)   Resp 16   Ht 6\' 2"  (1.88 m)   Wt 169 lb 3.2 oz (76.7 kg)   SpO2 97%   BMI 21.72 kg/m   Physical Exam HENT:     Head: Normocephalic and atraumatic.     Right Ear: Tympanic membrane, ear canal and external ear normal.     Left Ear: Tympanic membrane, ear canal and external ear normal.     Nose: Nose normal.     Mouth/Throat:     Mouth: Mucous membranes are moist.     Pharynx: Oropharynx is clear.  Eyes:     Extraocular Movements: Extraocular movements intact.     Conjunctiva/sclera: Conjunctivae normal.     Pupils: Pupils are equal, round, and reactive to light.  Neck:     Thyroid: No thyroid mass, thyromegaly or thyroid tenderness.  Cardiovascular:     Rate and Rhythm:  Normal rate and regular rhythm.     Pulses: Normal pulses.     Heart sounds: Normal heart sounds.  Pulmonary:     Effort: Pulmonary effort is normal.     Breath sounds: Normal breath sounds.  Abdominal:     General: Bowel sounds are normal.     Palpations: Abdomen is soft.  Genitourinary:    Comments: Patient declined. Musculoskeletal:        General:  Normal range of motion.     Right shoulder: Normal.     Left shoulder: Normal.     Right upper arm: Normal.     Left upper arm: Normal.     Right elbow: Normal.     Left elbow: Normal.     Right forearm: Normal.     Left forearm: Normal.     Right wrist: Normal.     Left wrist: Normal.     Right hand: Normal.     Left hand: Normal.     Cervical back: Normal, normal range of motion and neck supple.     Thoracic back: Normal.     Lumbar back: Normal.     Right hip: Normal.     Left hip: Normal.     Right upper leg: Normal.     Left upper leg: Normal.     Right knee: Normal.     Left knee: Normal.     Right lower leg: Normal.     Left lower leg: Normal.     Right ankle: Normal.     Left ankle: Normal.     Right foot: Normal.     Left foot: Normal.  Skin:    General: Skin is warm and dry.     Capillary Refill: Capillary refill takes less than 2 seconds.  Neurological:     General: No focal deficit present.     Mental Status: He is alert and oriented to person, place, and time.  Psychiatric:        Mood and Affect: Mood normal.        Behavior: Behavior normal.    ASSESSMENT AND PLAN: 1. Annual physical exam (Primary) - Counseled on 150 minutes of exercise per week as tolerated, healthy eating (including decreased daily intake of saturated fats, cholesterol, added sugars, sodium), STI prevention, and routine healthcare maintenance.  2. Screening for metabolic disorder - Routine screening.  - CMP14+EGFR  3. Screening for deficiency anemia - Routine screening.  - CBC  4. Diabetes mellitus screening - Routine  screening.  - Hemoglobin A1c  5. Screening cholesterol level - Routine screening.  - Lipid panel  6. Thyroid disorder screen - Routine screening.  - TSH  7. Primary hypertension - New onset. - Blood pressure not at goal during today's visit. Patient asymptomatic without chest pressure, chest pain, palpitations, shortness of breath, worst headache of life, and any additional red flag symptoms. - Trial Valsartan as prescribed.  - Counseled on blood pressure goal of less than 130/80, low-sodium, DASH diet, medication compliance, and 150 minutes of moderate intensity exercise per week as tolerated. Counseled on medication adherence and adverse effects. - Follow-up with primary provider in 4 weeks or sooner if needed. - valsartan (DIOVAN) 40 MG tablet; Take 1 tablet (40 mg total) by mouth daily.  Dispense: 90 tablet; Refill: 0  8. Anxiety and depression - Patient denies thoughts of self-harm, suicidal ideations, homicidal ideations. - Patient declined pharmacological therapy.  - Patient declined referral to Psychiatry.  - Follow-up with primary provider as scheduled.   Patient was given the opportunity to ask questions.  Patient verbalized understanding of the plan and was able to repeat key elements of the plan. Patient was given clear instructions to go to Emergency Department or return to medical center if symptoms don't improve, worsen, or new problems develop.The patient verbalized understanding.   Orders Placed This Encounter  Procedures   CBC   Lipid panel   CMP14+EGFR  Hemoglobin A1c   TSH     Requested Prescriptions   Signed Prescriptions Disp Refills   valsartan (DIOVAN) 40 MG tablet 90 tablet 0    Sig: Take 1 tablet (40 mg total) by mouth daily.    Return in about 1 year (around 10/31/2024) for Physical per patient preference.  Senaida Dama, NP

## 2023-11-02 ENCOUNTER — Ambulatory Visit: Payer: Self-pay | Admitting: Family

## 2023-11-02 LAB — LIPID PANEL
Chol/HDL Ratio: 2.3 ratio (ref 0.0–5.0)
Cholesterol, Total: 136 mg/dL (ref 100–199)
HDL: 60 mg/dL (ref >39)
LDL Chol Calc (NIH): 67 mg/dL (ref 0–99)
Triglycerides: 33 mg/dL (ref 0–149)
VLDL Cholesterol Cal: 9 mg/dL (ref 5–40)

## 2023-11-02 LAB — CBC
Hematocrit: 40.6 % (ref 37.5–51.0)
Hemoglobin: 13.4 g/dL (ref 13.0–17.7)
MCH: 33.5 pg — ABNORMAL HIGH (ref 26.6–33.0)
MCHC: 33 g/dL (ref 31.5–35.7)
MCV: 102 fL — ABNORMAL HIGH (ref 79–97)
Platelets: 165 10*3/uL (ref 150–450)
RBC: 4 x10E6/uL — ABNORMAL LOW (ref 4.14–5.80)
RDW: 11.8 % (ref 11.6–15.4)
WBC: 4 10*3/uL (ref 3.4–10.8)

## 2023-11-02 LAB — CMP14+EGFR
ALT: 11 IU/L (ref 0–44)
AST: 17 IU/L (ref 0–40)
Albumin: 4.5 g/dL (ref 4.1–5.1)
Alkaline Phosphatase: 106 IU/L (ref 44–121)
BUN/Creatinine Ratio: 10 (ref 9–20)
BUN: 12 mg/dL (ref 6–20)
Bilirubin Total: 0.4 mg/dL (ref 0.0–1.2)
CO2: 23 mmol/L (ref 20–29)
Calcium: 9.2 mg/dL (ref 8.7–10.2)
Chloride: 101 mmol/L (ref 96–106)
Creatinine, Ser: 1.18 mg/dL (ref 0.76–1.27)
Globulin, Total: 2.5 g/dL (ref 1.5–4.5)
Glucose: 89 mg/dL (ref 70–99)
Potassium: 4.5 mmol/L (ref 3.5–5.2)
Sodium: 137 mmol/L (ref 134–144)
Total Protein: 7 g/dL (ref 6.0–8.5)
eGFR: 81 mL/min/{1.73_m2} (ref >59)

## 2023-11-02 LAB — HEMOGLOBIN A1C
Est. average glucose Bld gHb Est-mCnc: 100 mg/dL
Hgb A1c MFr Bld: 5.1 % (ref 4.8–5.6)

## 2023-11-02 LAB — TSH: TSH: 0.786 u[IU]/mL (ref 0.450–4.500)

## 2023-11-04 ENCOUNTER — Ambulatory Visit (HOSPITAL_COMMUNITY)
Admission: RE | Admit: 2023-11-04 | Discharge: 2023-11-04 | Disposition: A | Discharge status: Home / Self Care | Payer: MEDICAID | Source: Ambulatory Visit | Attending: Student | Admitting: Student

## 2023-11-04 DIAGNOSIS — R112 Nausea with vomiting, unspecified: Secondary | ICD-10-CM | POA: Insufficient documentation

## 2023-11-04 MED ORDER — TECHNETIUM TC 99M SULFUR COLLOID
2.0000 | Freq: Once | INTRAVENOUS | Status: AC | PRN
  Administered 2023-11-04: 2 via INTRAVENOUS

## 2024-02-06 ENCOUNTER — Encounter (HOSPITAL_COMMUNITY): Payer: Self-pay | Admitting: Emergency Medicine

## 2024-02-06 ENCOUNTER — Emergency Department (HOSPITAL_COMMUNITY): Payer: Self-pay

## 2024-02-06 ENCOUNTER — Emergency Department (HOSPITAL_COMMUNITY)
Admission: EM | Admit: 2024-02-06 | Discharge: 2024-02-06 | Disposition: A | Discharge status: Home / Self Care | Payer: Self-pay | Attending: Emergency Medicine | Admitting: Emergency Medicine

## 2024-02-06 ENCOUNTER — Other Ambulatory Visit: Payer: Self-pay

## 2024-02-06 DIAGNOSIS — S63501A Unspecified sprain of right wrist, initial encounter: Secondary | ICD-10-CM | POA: Insufficient documentation

## 2024-02-06 DIAGNOSIS — Z91040 Latex allergy status: Secondary | ICD-10-CM | POA: Insufficient documentation

## 2024-02-06 DIAGNOSIS — W19XXXA Unspecified fall, initial encounter: Secondary | ICD-10-CM | POA: Insufficient documentation
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
## 2024-02-06 NOTE — Discharge Instructions (Signed)
 You likely have a sprain of your right wrist.  You may use ibuprofen  and Tylenol  to help with the pain.  You can also use the wrist brace and follow-up with the orthopedic specialist in 1-2 weeks if still having pain.  If you develop swelling, redness, fever, or any other new/concerning symptoms then return to the ER.

## 2024-02-06 NOTE — ED Notes (Signed)
 Patient transported to X-ray

## 2024-03-29 ENCOUNTER — Emergency Department (HOSPITAL_COMMUNITY)
Admission: EM | Admit: 2024-03-29 | Discharge: 2024-03-29 | Disposition: A | Discharge status: Home / Self Care | Payer: Self-pay | Attending: Emergency Medicine | Admitting: Emergency Medicine

## 2024-03-29 DIAGNOSIS — Z7689 Persons encountering health services in other specified circumstances: Secondary | ICD-10-CM

## 2024-03-29 DIAGNOSIS — Z91040 Latex allergy status: Secondary | ICD-10-CM | POA: Insufficient documentation

## 2024-03-29 DIAGNOSIS — Z0279 Encounter for issue of other medical certificate: Secondary | ICD-10-CM | POA: Insufficient documentation

## 2024-03-29 NOTE — ED Provider Notes (Signed)
   EMERGENCY DEPARTMENT AT [facility] Provider Note   CSN: [id number] Arrival date & time: 03/29/24  1003     Patient presents with: Work Note   Erik Cowan is a 38 y.o. male.   HPI 38 year old male presents requesting a work note.  2 weeks ago he had signs and symptoms of COVID, took a home test that was positive, and had to miss work.  He has now had all of his symptoms completely resolved.  He was trying to go back to work but they told him that he would require a doctor's note.  He otherwise feels completely fine and has no current symptoms.  Prior to Admission medications   Medication Sig Start Date End Date Taking? Authorizing Provider  OLANZapine  (ZYPREXA ) 7.5 MG tablet Take 1 tablet (7.5 mg total) by mouth at bedtime. 07/13/23   Lorriane Charlene SAUNDERS, MD  ondansetron  (ZOFRAN -ODT) 8 MG disintegrating tablet Take 1 tablet (8 mg total) by mouth every 8 (eight) hours as needed for nausea or vomiting. 10/10/23   Lorren Greig PARAS, NP  pantoprazole  (PROTONIX ) 20 MG tablet Take 1 tablet (20 mg total) by mouth daily. 08/19/23   Jarold Olam HERO, PA-C  pantoprazole  (PROTONIX ) 40 MG tablet Take 1 tablet (40 mg total) by mouth daily. 10/02/23   Dasie Faden, MD  sucralfate  (CARAFATE ) 1 g tablet Take 1 tablet (1 g total) by mouth 4 (four) times daily -  with meals and at bedtime. 08/19/23   Jarold Olam HERO, PA-C  valsartan  (DIOVAN ) 40 MG tablet Take 1 tablet (40 mg total) by mouth daily. 11/01/23   Lorren Greig PARAS, NP    Allergies: Gluten meal, Haldol  [haloperidol ], Milk-related compounds, and Latex    Review of Systems  Respiratory:  Negative for cough and shortness of breath.   Cardiovascular:  Negative for chest pain.    Updated Vital Signs BP 130/81 (BP Location: Right Arm)   Pulse (!) 51   Temp 98.2 F (36.8 C) (Oral)   Resp 16   SpO2 99%   Physical Exam Vitals and nursing note reviewed.  Constitutional:      General: He is not in acute distress.     Appearance: He is well-developed. He is not ill-appearing or diaphoretic.  HENT:     Head: Normocephalic and atraumatic.  Pulmonary:     Effort: Pulmonary effort is normal.  Abdominal:     General: There is no distension.  Skin:    General: Skin is warm and dry.  Neurological:     Mental Status: He is alert.     (all labs ordered are listed, but only abnormal results are displayed) Labs Reviewed - No data to display  EKG: None  Radiology: No results found.   Procedures   Medications Ordered in the ED - No data to display                                  Medical Decision Making  Patient is asymptomatic. Seems to have resolved his covid based on symptoms. Will discharge and give work note.     Final diagnoses:  Return to work evaluation    ED Discharge Orders     None          Freddi Hamilton, MD 03/29/24 1026

## 2024-03-29 NOTE — ED Triage Notes (Signed)
 Had a positive covid test 2 weeks ago with a positive at home test. Job is now asking for a work note or he cannot go back to work. No other concerns

## 2024-04-30 ENCOUNTER — Other Ambulatory Visit: Payer: Self-pay | Admitting: Emergency Medicine

## 2024-04-30 DIAGNOSIS — I10 Essential (primary) hypertension: Secondary | ICD-10-CM

## 2024-04-30 MED ORDER — VALSARTAN 40 MG PO TABS: 40.0000 mg | ORAL_TABLET | Freq: Every day | ORAL | 0 refills | Status: AC

## 2024-04-30 NOTE — Telephone Encounter (Signed)
 Refill request

## 2024-04-30 NOTE — Telephone Encounter (Signed)
 Complete

## 2024-05-18 ENCOUNTER — Other Ambulatory Visit: Payer: Self-pay | Admitting: *Deleted

## 2024-05-18 DIAGNOSIS — R1084 Generalized abdominal pain: Secondary | ICD-10-CM

## 2024-05-18 MED ORDER — ONDANSETRON 8 MG PO TBDP: 8.0000 mg | ORAL_TABLET | Freq: Three times a day (TID) | ORAL | 2 refills | Status: AC | PRN

## 2024-05-18 NOTE — Telephone Encounter (Signed)
 Complete
